# Patient Record
Sex: Male | Born: 1970 | Race: Black or African American | Hispanic: No | Marital: Single | Smoking: Current every day smoker
Health system: Southern US, Community
[De-identification: ages and names within clinical notes are randomized; demographics above are authoritative.]

## PROBLEM LIST (undated history)

## (undated) DIAGNOSIS — R220 Localized swelling, mass and lump, head: Secondary | ICD-10-CM

## (undated) DIAGNOSIS — C801 Malignant (primary) neoplasm, unspecified: Secondary | ICD-10-CM

## (undated) DIAGNOSIS — Z87828 Personal history of other (healed) physical injury and trauma: Secondary | ICD-10-CM

## (undated) DIAGNOSIS — L03116 Cellulitis of left lower limb: Secondary | ICD-10-CM

---

## 2005-11-15 ENCOUNTER — Emergency Department (HOSPITAL_COMMUNITY): Admission: EM | Admit: 2005-11-15 | Discharge: 2005-11-16 | Payer: Self-pay | Admitting: Emergency Medicine

## 2005-12-10 ENCOUNTER — Emergency Department (HOSPITAL_COMMUNITY): Admission: EM | Admit: 2005-12-10 | Discharge: 2005-12-10 | Payer: Self-pay | Admitting: Emergency Medicine

## 2007-03-06 ENCOUNTER — Emergency Department (HOSPITAL_COMMUNITY): Admission: EM | Admit: 2007-03-06 | Discharge: 2007-03-06 | Payer: Self-pay | Admitting: Emergency Medicine

## 2008-09-13 ENCOUNTER — Emergency Department (HOSPITAL_COMMUNITY): Admission: EM | Admit: 2008-09-13 | Discharge: 2008-09-13 | Payer: Self-pay | Admitting: Emergency Medicine

## 2008-09-13 ENCOUNTER — Encounter (INDEPENDENT_AMBULATORY_CARE_PROVIDER_SITE_OTHER): Payer: Self-pay | Admitting: Emergency Medicine

## 2008-09-14 ENCOUNTER — Ambulatory Visit: Payer: Self-pay | Admitting: Vascular Surgery

## 2008-09-16 ENCOUNTER — Emergency Department (HOSPITAL_COMMUNITY): Admission: EM | Admit: 2008-09-16 | Discharge: 2008-09-16 | Payer: Self-pay | Admitting: Emergency Medicine

## 2008-10-29 ENCOUNTER — Emergency Department (HOSPITAL_COMMUNITY): Admission: EM | Admit: 2008-10-29 | Discharge: 2008-10-29 | Payer: Self-pay | Admitting: Emergency Medicine

## 2009-02-23 ENCOUNTER — Emergency Department (HOSPITAL_COMMUNITY): Admission: EM | Admit: 2009-02-23 | Discharge: 2009-02-23 | Payer: Self-pay | Admitting: Emergency Medicine

## 2010-05-01 LAB — BASIC METABOLIC PANEL
BUN: 6 mg/dL (ref 6–23)
GFR calc non Af Amer: >60 mL/min (ref >60)
Potassium: 4.2 mEq/L (ref 3.5–5.1)
Sodium: 139 mEq/L (ref 135–145)

## 2010-05-01 LAB — DIFFERENTIAL
Eosinophils Relative: 1 % (ref 0–5)
Lymphocytes Relative: 31 % (ref 12–46)
Lymphs Abs: 2.3 10*3/uL (ref 0.7–4.0)

## 2010-05-01 LAB — CBC
HCT: 44.8 % (ref 39.0–52.0)
Platelets: 199 10*3/uL (ref 150–400)
WBC: 7.5 10*3/uL (ref 4.0–10.5)

## 2010-05-01 LAB — GLUCOSE, CAPILLARY

## 2010-06-11 NOTE — Consult Note (Signed)
NAME:  Johnny Mooney, Johnny Mooney NO.:  [id number]   MEDICAL RECORD NO.:  [id number]          PATIENT TYPE:  EMS   LOCATION:  ED                           FACILITY:  [facility]   PHYSICIAN:  Bertram Millard. Dahlstedt, M.D.DATE OF BIRTH:  December 15, 1970   DATE OF CONSULTATION:  12/10/2005  DATE OF DISCHARGE:  12/10/2005                                 CONSULTATION   REASON FOR CONSULTATION:  Penile swelling.   BRIEF HISTORY:  A 40 year old male without prior history of STD who  presents to the [facility] Emergency Room today with a one and a half  day history of a swollen area on the right penis.  Over the past three  weeks or so, he has had swollen area on the right posterior scalp.  It  is only in the past day and a half that he has noted this penile area.  It has not drained.  He does not note any lesions in his girlfriend.  He  has not had any dysuria, fever, chills.  He was seen by the ER doctor  and ER consultation was requested by the urology service.   PAST MEDICAL HISTORY:  Noncontributory.   He has a steady partner x10 years.  He has a child.  He is unemployed.  He has recently moved to [place] from [place].   He is not on any medications.  No known drug allergies.   EXAM:  Pleasant adult male.  He had a draining abscess from his  posterior scalp on the right.  Phallus was circumcised.  Glans and  meatus were normal without discharge.  No meatal stenosis.  Penile skin  was edematous on the right.  Approximately 2 cm proximal to the right  coronal ridge, he had a 2 cm semifluctuant area with ichthyotic skin  overlying it.  It was quite tender.  Some surrounding induration.  There  was no broken skin.  He had bilateral groin adenopathy just over a  centimeter nodes that were minimally tender.   IMPRESSION:  Penile lesion, small abscess versus sexually transmitted  disease in a patient without similar history.   PLAN:  1. We gave him Rocephin IM today.  2. Sent  home with doxycycline 100 mg b.i.d. for a week.  3. I gave him a card and I will see him back in the office within the      next two or three days.  4. Refrain from intercourse until this clears up.      Bertram Millard. Dahlstedt, M.D.  Electronically Signed     SMD/MEDQ  D:  12/10/2005  T:  12/10/2005  Job:  [id number]

## 2012-02-19 ENCOUNTER — Inpatient Hospital Stay (HOSPITAL_BASED_OUTPATIENT_CLINIC_OR_DEPARTMENT_OTHER)
Admission: EM | Admit: 2012-02-19 | Discharge: 2012-02-22 | DRG: 603 | Disposition: A | Discharge status: Home / Self Care | Payer: MEDICAID | Attending: Internal Medicine | Admitting: Internal Medicine

## 2012-02-19 ENCOUNTER — Encounter (HOSPITAL_BASED_OUTPATIENT_CLINIC_OR_DEPARTMENT_OTHER): Payer: Self-pay

## 2012-02-19 DIAGNOSIS — L0390 Cellulitis, unspecified: Secondary | ICD-10-CM

## 2012-02-19 DIAGNOSIS — Z720 Tobacco use: Secondary | ICD-10-CM | POA: Diagnosis present

## 2012-02-19 DIAGNOSIS — F17200 Nicotine dependence, unspecified, uncomplicated: Secondary | ICD-10-CM | POA: Diagnosis present

## 2012-02-19 DIAGNOSIS — D72829 Elevated white blood cell count, unspecified: Secondary | ICD-10-CM | POA: Diagnosis present

## 2012-02-19 DIAGNOSIS — L02419 Cutaneous abscess of limb, unspecified: Principal | ICD-10-CM | POA: Diagnosis present

## 2012-02-19 LAB — CBC WITH DIFFERENTIAL/PLATELET
Eosinophils Relative: 1 % (ref 0–5)
HCT: 42.4 % (ref 39.0–52.0)
Hemoglobin: 14.7 g/dL (ref 13.0–17.0)
Lymphocytes Relative: 21 % (ref 12–46)
Lymphs Abs: 2.4 10*3/uL (ref 0.7–4.0)
MCV: 82.8 fL (ref 78.0–100.0)
Monocytes Absolute: 1.4 10*3/uL — ABNORMAL HIGH (ref 0.1–1.0)
Monocytes Relative: 12 % (ref 3–12)
RBC: 5.12 MIL/uL (ref 4.22–5.81)
RDW: 14.9 % (ref 11.5–15.5)
WBC: 11.3 10*3/uL — ABNORMAL HIGH (ref 4.0–10.5)

## 2012-02-19 LAB — BASIC METABOLIC PANEL
BUN: 9 mg/dL (ref 6–23)
CO2: 26 mEq/L (ref 19–32)
Calcium: 9.6 mg/dL (ref 8.4–10.5)
Creatinine, Ser: 0.9 mg/dL (ref 0.50–1.35)
Glucose, Bld: 97 mg/dL (ref 70–99)

## 2012-02-19 MED ORDER — CLINDAMYCIN PHOSPHATE 600 MG/50ML IV SOLN
600.0000 mg | Freq: Once | INTRAVENOUS | Status: AC
  Administered 2012-02-19: 600 mg via INTRAVENOUS
  Filled 2012-02-19: qty 50

## 2012-02-19 NOTE — ED Notes (Signed)
Attempted to call report to unit, RN stated that she would call back.

## 2012-02-19 NOTE — ED Provider Notes (Signed)
Medical screening examination/treatment/procedure(s) were performed by non-physician practitioner and as supervising physician I was immediately available for consultation/collaboration.   Rolan Bucco, MD 02/19/12 2008

## 2012-02-19 NOTE — ED Notes (Signed)
Patients left lower leg red, swollen, painful, warm to touch. Redness streaks up the inside of his leg up into the groin area. States that symptoms started on Thursday, but today the pain is much worse.

## 2012-02-19 NOTE — ED Provider Notes (Signed)
History     CSN: [id number]  Arrival date & time 02/19/12  1443   First MD Initiated Contact with Patient 02/19/12 1553      Chief Complaint  Patient presents with  . Cellulitis    (Consider location/radiation/quality/duration/timing/severity/associated sxs/prior treatment) HPI Comments: Johnny Mooney is a 42 y.o. male w no sig PMHx presents to the ER c/o right lower extremity cellulitis. Onset of symptoms began on Friday morning and were described as warmth, erythema and selling located just above his ankle medially. Since the onset symptoms have been gradually worsening with infection spreading up to his inner thigh. When pt awoke this morning he the pain had worsened to the point that he could not ambulate. Pt does not want pain medication. Pt denies being a Diabetic, recent steroid use, CA hx, HIV or any other reason to be immunocompromised. No known alleviating factors. Pt denies associated s/s including fevers, night sweats, chills, N/V/D, CP , or SOB.   The history is provided by the patient.    History reviewed. No pertinent past medical history.  History reviewed. No pertinent past surgical history.  History reviewed. No pertinent family history.  History  Substance Use Topics  . Smoking status: Current Every Day Smoker -- 2.0 packs/day    Types: Cigars  . Smokeless tobacco: Never Used  . Alcohol Use: 1.0 oz/week    2 drink(s) per week      Review of Systems  Constitutional: Negative for fever, diaphoresis and activity change.  HENT: Negative for congestion and neck pain.   Respiratory: Negative for cough.   Genitourinary: Negative for dysuria.  Musculoskeletal: Negative for myalgias.  Skin: Positive for color change and rash. Negative for wound.  Neurological: Negative for headaches.  All other systems reviewed and are negative.    Allergies  Review of patient's allergies indicates no known allergies.  Home Medications   Current Outpatient Rx    Name  Route  Sig  Dispense  Refill  . IBUPROFEN 200 MG PO TABS   Oral   Take 400 mg by mouth every 8 (eight) hours as needed.           BP 126/82  Pulse 80  Temp 100.5 F (38.1 C) (Oral)  Resp 20  Ht 5\' 10"  (1.778 m)  Wt 205 lb (92.987 kg)  BMI 29.41 kg/m2  SpO2 100%  Physical Exam  Nursing note and vitals reviewed. Constitutional: He is oriented to person, place, and time. He appears well-developed and well-nourished. No distress.  HENT:  Head: Normocephalic and atraumatic.  Eyes: Conjunctivae normal and EOM are normal.  Neck: Normal range of motion.  Cardiovascular:       RRR, intact distal pulses. Cap refill < 3 seconds.   Pulmonary/Chest: Effort normal.  Musculoskeletal: Normal range of motion.       Normal flexion and extension of extremity   Neurological: He is alert and oriented to person, place, and time.  Skin: Skin is warm and dry. No rash noted. He is not diaphoretic. There is erythema.       Erythema of RLE starting at medial malleolus and extending up yo inner thigh. Skin is warm, tender and swollen.   Psychiatric: He has a normal mood and affect. His behavior is normal.    ED Course  Procedures (including critical care time)  Labs Reviewed  CBC WITH DIFFERENTIAL - Abnormal; Notable for the following:    WBC 11.3 (*)     Monocytes Absolute 1.4 (*)  All other components within normal limits  BASIC METABOLIC PANEL  URINALYSIS, ROUTINE W REFLEX MICROSCOPIC   No results found.   No diagnosis found.    MDM  Cellulitis  Patient was past medical history presents emergency department with right lower leg cellulitis.  IV clindamycin given one emergency department.  Patient is to be transferred to [facility] for observation and continued IV antibiotics.  Patient states that he did not have a primary care physician and denies any immunocompromise.The patient appears reasonably stabilized for admission considering the current resources, flow, and  capabilities available in the ED at this time, and I doubt any other [facility] requiring further screening and/or treatment in the ED prior to admission.         Jaci Carrel, [place] 02/19/12 1919

## 2012-02-19 NOTE — ED Notes (Signed)
The patient know that we need urine, he has stated that he is unable to urinate at this time.

## 2012-02-19 NOTE — ED Notes (Signed)
Pt states that he has tenderness, redness, inflammation, and swelling to the R leg.  Pt states that onset was Friday evening and today pain is more severe he is unable to tolerate it.  Pt states ambulation is very painful if not nearly impossible due to pain.

## 2012-02-20 ENCOUNTER — Encounter (HOSPITAL_COMMUNITY): Payer: Self-pay | Admitting: Internal Medicine

## 2012-02-20 DIAGNOSIS — F17200 Nicotine dependence, unspecified, uncomplicated: Secondary | ICD-10-CM

## 2012-02-20 DIAGNOSIS — L0291 Cutaneous abscess, unspecified: Secondary | ICD-10-CM

## 2012-02-20 DIAGNOSIS — L0390 Cellulitis, unspecified: Secondary | ICD-10-CM

## 2012-02-20 DIAGNOSIS — Z720 Tobacco use: Secondary | ICD-10-CM | POA: Diagnosis present

## 2012-02-20 LAB — CBC
HCT: 39.5 % (ref 39.0–52.0)
Hemoglobin: 13.4 g/dL (ref 13.0–17.0)
MCH: 28.4 pg (ref 26.0–34.0)
MCHC: 33.9 g/dL (ref 30.0–36.0)
MCV: 83.7 fL (ref 78.0–100.0)
RDW: 14.9 % (ref 11.5–15.5)

## 2012-02-20 LAB — BASIC METABOLIC PANEL
BUN: 7 mg/dL (ref 6–23)
CO2: 28 mEq/L (ref 19–32)
Chloride: 101 mEq/L (ref 96–112)
Creatinine, Ser: 0.84 mg/dL (ref 0.50–1.35)
Glucose, Bld: 97 mg/dL (ref 70–99)
Potassium: 4.1 mEq/L (ref 3.5–5.1)

## 2012-02-20 LAB — URINALYSIS, ROUTINE W REFLEX MICROSCOPIC
Glucose, UA: NEGATIVE mg/dL
Hgb urine dipstick: NEGATIVE
Ketones, ur: NEGATIVE mg/dL
Protein, ur: NEGATIVE mg/dL
pH: 6.5 (ref 5.0–8.0)

## 2012-02-20 MED ORDER — TRAMADOL HCL 50 MG PO TABS
50.0000 mg | ORAL_TABLET | Freq: Four times a day (QID) | ORAL | Status: DC | PRN
  Administered 2012-02-20 – 2012-02-22 (×3): 50 mg via ORAL
  Filled 2012-02-20 (×3): qty 1

## 2012-02-20 MED ORDER — SODIUM CHLORIDE 0.9 % IV SOLN: INTRAVENOUS | Status: DC

## 2012-02-20 MED ORDER — HYDROMORPHONE HCL PF 1 MG/ML IJ SOLN
1.0000 mg | INTRAMUSCULAR | Status: DC | PRN
  Administered 2012-02-20 – 2012-02-21 (×5): 1 mg via INTRAVENOUS
  Filled 2012-02-20 (×5): qty 1

## 2012-02-20 MED ORDER — ONDANSETRON HCL 4 MG/2ML IJ SOLN: 4.0000 mg | Freq: Three times a day (TID) | INTRAMUSCULAR | Status: DC | PRN

## 2012-02-20 MED ORDER — ACETAMINOPHEN 325 MG PO TABS
650.0000 mg | ORAL_TABLET | Freq: Four times a day (QID) | ORAL | Status: DC | PRN
  Administered 2012-02-20 – 2012-02-21 (×3): 650 mg via ORAL
  Filled 2012-02-20 (×3): qty 2

## 2012-02-20 MED ORDER — VANCOMYCIN HCL 10 G IV SOLR
1750.0000 mg | Freq: Once | INTRAVENOUS | Status: AC
  Administered 2012-02-20: 1750 mg via INTRAVENOUS
  Filled 2012-02-20: qty 1750

## 2012-02-20 MED ORDER — TETANUS-DIPHTH-ACELL PERTUSSIS 5-2.5-18.5 LF-MCG/0.5 IM SUSP
0.5000 mL | Freq: Once | INTRAMUSCULAR | Status: AC
  Administered 2012-02-20: 0.5 mL via INTRAMUSCULAR
  Filled 2012-02-20: qty 0.5

## 2012-02-20 MED ORDER — CIPROFLOXACIN IN D5W 400 MG/200ML IV SOLN
400.0000 mg | Freq: Two times a day (BID) | INTRAVENOUS | Status: DC
  Administered 2012-02-20 – 2012-02-22 (×5): 400 mg via INTRAVENOUS
  Filled 2012-02-20 (×6): qty 200

## 2012-02-20 MED ORDER — HYDROMORPHONE HCL PF 1 MG/ML IJ SOLN
INTRAMUSCULAR | Status: AC
  Filled 2012-02-20: qty 1

## 2012-02-20 MED ORDER — WHITE PETROLATUM GEL
Status: AC
  Administered 2012-02-20: 03:00:00
  Filled 2012-02-20: qty 5

## 2012-02-20 MED ORDER — SODIUM CHLORIDE 0.9 % IV SOLN
INTRAVENOUS | Status: DC
  Administered 2012-02-20: 1000 mL via INTRAVENOUS

## 2012-02-20 MED ORDER — ONDANSETRON HCL 4 MG/2ML IJ SOLN: 4.0000 mg | Freq: Four times a day (QID) | INTRAMUSCULAR | Status: DC | PRN

## 2012-02-20 MED ORDER — ENOXAPARIN SODIUM 40 MG/0.4ML ~~LOC~~ SOLN
40.0000 mg | SUBCUTANEOUS | Status: DC
  Administered 2012-02-20 – 2012-02-22 (×3): 40 mg via SUBCUTANEOUS
  Filled 2012-02-20 (×3): qty 0.4

## 2012-02-20 MED ORDER — ONDANSETRON HCL 4 MG PO TABS
4.0000 mg | ORAL_TABLET | Freq: Four times a day (QID) | ORAL | Status: DC | PRN
  Administered 2012-02-20: 4 mg via ORAL
  Filled 2012-02-20: qty 1

## 2012-02-20 MED ORDER — VANCOMYCIN HCL IN DEXTROSE 1-5 GM/200ML-% IV SOLN
1000.0000 mg | Freq: Three times a day (TID) | INTRAVENOUS | Status: DC
  Administered 2012-02-20 – 2012-02-22 (×6): 1000 mg via INTRAVENOUS
  Filled 2012-02-20 (×8): qty 200

## 2012-02-20 MED ORDER — ACETAMINOPHEN 650 MG RE SUPP: 650.0000 mg | Freq: Four times a day (QID) | RECTAL | Status: DC | PRN

## 2012-02-20 NOTE — Progress Notes (Signed)
Utilization review completed. Shakera Ebrahimi, RN, BSN. 

## 2012-02-20 NOTE — H&P (Signed)
Johnny Mooney is an 42 y.o. male.  Patient was seen and examined on February 20, 2012. PCP - none.  Chief Complaint: Right lower extremity pain.  HPI: 42 year old male with no significant past medical history started experiencing fever and chills on Friday 3 days ago with small skin induration on the distal aspect of the right leg. The swelling of the right leg slowly became worse and and started to progress proximally. At this point patient came to the ER in [facility] [place]. Patient has been started IV clindamycin and has been admitted for further IV antibiotics. Patient does not recall having any insect bites or trauma. Denies having similar episodes previously.  History reviewed. No pertinent past medical history.  Past Surgical History  Procedure Date  . Trauma surgery     Family History  Problem Relation Age of Onset  . Diabetes Mellitus II Mother   . Asthma Father    Social History:  reports that he has been smoking Cigars.  He has never used smokeless tobacco. He reports that he drinks about one ounce of alcohol per week. He reports that he does not use illicit drugs.  Allergies: No Known Allergies  Medications Prior to Admission  Medication Sig Dispense Refill  . ibuprofen (ADVIL,MOTRIN) 200 MG tablet Take 400 mg by mouth every 8 (eight) hours as needed.        Results for orders placed during the hospital encounter of 02/19/12 (from the past 48 hour(s))  CBC WITH DIFFERENTIAL     Status: Abnormal   Collection Time   02/19/12  4:11 PM      Component Value Range Comment   WBC 11.3 (*) 4.0 - 10.5 K/uL    RBC 5.12  4.22 - 5.81 MIL/uL    Hemoglobin 14.7  13.0 - 17.0 g/dL    HCT 16.1  09.6 - 04.5 %    MCV 82.8  78.0 - 100.0 fL    MCH 28.7  26.0 - 34.0 pg    MCHC 34.7  30.0 - 36.0 g/dL    RDW 40.9  81.1 - 91.4 %    Platelets 178  150 - 400 K/uL    Neutrophils Relative 66  43 - 77 %    Neutro Abs 7.4  1.7 - 7.7 K/uL    Lymphocytes Relative 21  12 - 46 %    Lymphs Abs 2.4  0.7 - 4.0 K/uL    Monocytes Relative 12  3 - 12 %    Monocytes Absolute 1.4 (*) 0.1 - 1.0 K/uL    Eosinophils Relative 1  0 - 5 %    Eosinophils Absolute 0.1  0.0 - 0.7 K/uL    Basophils Relative 0  0 - 1 %    Basophils Absolute 0.0  0.0 - 0.1 K/uL   BASIC METABOLIC PANEL     Status: Normal   Collection Time   02/19/12  4:11 PM      Component Value Range Comment   Sodium 140  135 - 145 mEq/L    Potassium 3.9  3.5 - 5.1 mEq/L    Chloride 99  96 - 112 mEq/L    CO2 26  19 - 32 mEq/L    Glucose, Bld 97  70 - 99 mg/dL    BUN 9  6 - 23 mg/dL    Creatinine, Ser 7.82  0.50 - 1.35 mg/dL    Calcium 9.6  8.4 - 95.6 mg/dL    GFR calc non Af  Amer >90  >90 mL/min    GFR calc Af Amer >90  >90 mL/min    No results found.  Review of Systems  Constitutional: Positive for fever and chills.  HENT: Negative.   Eyes: Negative.   Respiratory: Negative.   Cardiovascular: Negative.   Gastrointestinal: Negative.   Genitourinary: Negative.   Musculoskeletal:       Right lower extremity pain with eythema and swelling.  Skin: Negative.   Neurological: Negative.   Psychiatric/Behavioral: Negative.     Blood pressure 117/67, pulse 82, temperature 99.3 F (37.4 C), temperature source Oral, resp. rate 18, height 5\' 10"  (1.778 m), weight 87.272 kg (192 lb 6.4 oz), SpO2 98.00%. Physical Exam  Constitutional: He is oriented to person, place, and time. He appears well-developed and well-nourished. No distress.  HENT:  Head: Normocephalic and atraumatic.  Right Ear: External ear normal.  Left Ear: External ear normal.  Nose: Nose normal.  Mouth/Throat: Oropharynx is clear and moist. No oropharyngeal exudate.  Eyes: Conjunctivae normal are normal. Pupils are equal, round, and reactive to light. Right eye exhibits no discharge. Left eye exhibits no discharge. No scleral icterus.  Neck: Normal range of motion. Neck supple.  Cardiovascular: Normal rate and regular rhythm.   Respiratory:  Effort normal and breath sounds normal. No respiratory distress. He has no wheezes. He has no rales.  GI: Soft. Bowel sounds are normal. He exhibits no distension. There is no tenderness. There is no rebound.  Musculoskeletal:       Pain and swelling of the right lower extremity extending from right ankle ti distal thigh. Able to move foot without difficulty. Good sensation and pulses.  Neurological: He is alert and oriented to person, place, and time.  Skin: He is not diaphoretic.  Psychiatric: His behavior is normal.     Assessment/Plan #1. Cellulitis of the right lower extremity - patient has been placed on vancomycin and Cipro IV. At this moment patient is able to move his right extremity without difficulty and does not have any features of compartment syndrome. Closely observe clinically for any worsening. #2. Tobacco abuse - strongly advised to quit smoking.  CODE STATUS - full code.  Jlee Harkless N. 02/20/2012, 1:00 AM

## 2012-02-20 NOTE — Progress Notes (Signed)
ANTIBIOTIC CONSULT NOTE - INITIAL  Pharmacy Consult for Vancomycin Indication: cellulitis  No Known Allergies  Patient Measurements: Height: 5\' 10"  (177.8 cm) Weight: 192 lb 6.4 oz (87.272 kg) IBW/kg (Calculated) : 73   Vital Signs: Temp: 99.3 F (37.4 C) (01/26 2349) Temp src: Oral (01/26 2138) BP: 117/67 mmHg (01/26 2349) Pulse Rate: 82  (01/26 2349)  Labs:  Basename 02/19/12 1611  WBC 11.3*  HGB 14.7  PLT 178  LABCREA --  CREATININE 0.90   Estimated Creatinine Clearance: 113.8 ml/min (by C-G formula based on Cr of 0.9). No results found for this basename: VANCOTROUGH:2,VANCOPEAK:2,VANCORANDOM:2,GENTTROUGH:2,GENTPEAK:2,GENTRANDOM:2,TOBRATROUGH:2,TOBRAPEAK:2,TOBRARND:2,AMIKACINPEAK:2,AMIKACINTROU:2,AMIKACIN:2, in the last 72 hours   Microbiology: No results found for this or any previous visit (from the past 720 hour(s)).  Medical History: History reviewed. No pertinent past medical history.  Medications:  Prescriptions prior to admission  Medication Sig Dispense Refill  . ibuprofen (ADVIL,MOTRIN) 200 MG tablet Take 400 mg by mouth every 8 (eight) hours as needed.       Assessment: 42 yo male with cellulitis for empiric antibiotics  Goal of Therapy:  Vancomycin trough level 10-15 mcg/ml  Plan:  Vancomycin 1750 mg IV now, then 1 g IV q8h  Eddie Candle 02/20/2012,1:02 AM

## 2012-02-20 NOTE — Progress Notes (Signed)
[facility] PROGRESS NOTE  Johnny Mooney SAY:[id number] DOB: 11/09/1970 DOA: 02/19/2012 PCP: No primary provider on file.  Brief narrative 42 year old male with no significant past medical history started experiencing fever and chills on Friday 3 days ago with small skin induration on the distal aspect of the right leg. The swelling of the right leg slowly became worse and and started to progress proximally. At this point patient came to the ER in [facility] [place]. Patient has been started IV clindamycin and has been admitted for further IV antibiotics. Patient does not recall having any insect bites or trauma. Denies having similar episodes previously.  Assessment/Plan: 1. Right lower extremity cellulitis: Unclear precipitant. No obvious open wounds on the leg. Improving. Continue IV antibiotics for additional 24 hours and then consider discharge home on oral antibiotics. 2. Tobacco abuse: Cessation counseled.  Code Status: Full Family Communication: Discussed with patient Disposition Plan: Home when medically stable. Possibly 1/28   Consultants:  None  Procedures:  None  Antibiotics:  IV vancomycin 1/27 >  IV Cipro 1/27 >  HPI/Subjective: Indicates that right leg is approximately 70% better-decreasing redness, swelling and pain.  Objective: Filed Vitals:   02/19/12 1809 02/19/12 2138 02/19/12 2349 02/20/12 0614  BP: 126/82 124/74 117/67 112/59  Pulse: 80 81 82 61  Temp: 100.5 F (38.1 C) 99 F (37.2 C) 99.3 F (37.4 C) 97.9 F (36.6 C)  TempSrc: Oral Oral    Resp: 20 18 18 18   Height:   5\' 10"  (1.778 m)   Weight:   87.272 kg (192 lb 6.4 oz)   SpO2: 100% 98% 98% 99%    Intake/Output Summary (Last 24 hours) at 02/20/12 0859 Last data filed at 02/20/12 0500  Gross per 24 hour  Intake    120 ml  Output      0 ml  Net    120 ml   Filed Weights   02/19/12 1500 02/19/12 2349  Weight: 92.987 kg (205 lb) 87.272 kg (192 lb 6.4 oz)     Exam:   General exam: Comfortable.  Respiratory system: Clear. No increased work of breathing.  Cardiovascular: S1 and S2 heard, or cough. No JVD, murmurs or pedal edema.  Gastrointestinal system: Abdomen is nondistended, soft and nontender. Normal bowel sounds heard.  Central nervous system: Alert and oriented. No focal neurological deficits.  Extremities: Decreasing erythema, swelling and warmth (anterior medial right leg and inferior medial thigh)-receding from pen mark drawn on admission. Peripheral pulses symmetrically felt.  Data Reviewed: Basic Metabolic Panel:  Lab 02/20/12 2355 02/19/12 1611  NA 138 140  K 4.1 3.9  CL 101 99  CO2 28 26  GLUCOSE 97 97  BUN 7 9  CREATININE 0.84 0.90  CALCIUM 8.9 9.6  MG -- --  PHOS -- --   Liver Function Tests: No results found for this basename: AST:5,ALT:5,ALKPHOS:5,BILITOT:5,PROT:5,ALBUMIN:5 in the last 168 hours No results found for this basename: LIPASE:5,AMYLASE:5 in the last 168 hours No results found for this basename: AMMONIA:5 in the last 168 hours CBC:  Lab 02/20/12 0550 02/19/12 1611  WBC 11.4* 11.3*  NEUTROABS -- 7.4  HGB 13.4 14.7  HCT 39.5 42.4  MCV 83.7 82.8  PLT 187 178   Cardiac Enzymes: No results found for this basename: CKTOTAL:5,CKMB:5,CKMBINDEX:5,TROPONINI:5 in the last 168 hours BNP (last 3 results) No results found for this basename: PROBNP:3 in the last 8760 hours CBG: No results found for this basename: GLUCAP:5 in the last 168 hours  No results  found for this or any previous visit (from the past 240 hour(s)).   Studies: No results found.  Scheduled Meds:   . ciprofloxacin  400 mg Intravenous Q12H  . enoxaparin (LOVENOX) injection  40 mg Subcutaneous Q24H  . HYDROmorphone      . TDaP  0.5 mL Intramuscular Once  . vancomycin  1,000 mg Intravenous Q8H   Continuous Infusions:   . sodium chloride 1,000 mL (02/20/12 0150)    Principal Problem:  *Cellulitis Active Problems:   Tobacco abuse    Time spent: 25 minutes    [facility]  [facility] Pager [phone]. If 8PM-8AM, please contact night-coverage at [email or web address], password [facility] 02/20/2012, 8:59 AM  LOS: 1 day

## 2012-02-21 MED ORDER — IBUPROFEN 600 MG PO TABS
600.0000 mg | ORAL_TABLET | Freq: Three times a day (TID) | ORAL | Status: DC
  Administered 2012-02-21 – 2012-02-22 (×3): 600 mg via ORAL
  Filled 2012-02-21 (×5): qty 1

## 2012-02-21 NOTE — Progress Notes (Signed)
[facility] PROGRESS NOTE  BERT PTACEK EAV:[id number] DOB: [birth year] DOA: 02/19/2012 PCP: No primary provider on file.  Brief narrative 42 year old male with no significant past medical history started experiencing fever and chills on Friday 3 days ago with small skin induration on the distal aspect of the right leg. The swelling of the right leg slowly became worse and and started to progress proximally. At this point patient came to the ER in [facility] [place]. Patient has been started IV clindamycin and has been admitted for further IV antibiotics. Patient does not recall having any insect bites or trauma. Denies having similar episodes previously.  Assessment/Plan: 1. Right lower extremity cellulitis: Unclear precipitant. No obvious open wounds on the leg. Improving but still with fair amount of redness, warmth and mild tenderness in right anteriomedial leg. Continue IV antibiotics for additional 24 hours and then consider discharge home on oral antibiotics. 2. Tobacco abuse: Cessation counseled.  Code Status: Full Family Communication: Discussed with patient Disposition Plan: Home when medically stable. Possibly 1/29   Consultants:  None  Procedures:  None  Antibiotics:  IV vancomycin 1/27 >  IV Cipro 1/27 >  HPI/Subjective: Patient indicates that right leg is slowly improving. Mild pain. Able to ambulate. Redness not much better since yesterday.  Objective: Filed Vitals:   02/19/12 2349 02/20/12 0614 02/20/12 1337 02/20/12 2100  BP: 117/67 112/59 117/72 106/74  Pulse: 82 61 78 71  Temp: 99.3 F (37.4 C) 97.9 F (36.6 C) 98.9 F (37.2 C) 98.8 F (37.1 C)  TempSrc:      Resp: 18 18 20 20   Height: 5\' 10"  (1.778 m)     Weight: 87.272 kg (192 lb 6.4 oz)     SpO2: 98% 99% 99% 95%   No intake or output data in the 24 hours ending 02/21/12 1136 Filed Weights   02/19/12 1500 02/19/12 2349  Weight: 92.987 kg (205 lb) 87.272 kg (192 lb 6.4 oz)     Exam:   General exam: Comfortable.  Respiratory system: Clear. No increased work of breathing.  Cardiovascular: S1 and S2 heard, or cough. No JVD, murmurs or pedal edema.  Gastrointestinal system: Abdomen is nondistended, soft and nontender. Normal bowel sounds heard.  Central nervous system: Alert and oriented. No focal neurological deficits.  Extremities: Decreasing erythema, swelling and warmth (anterior medial right leg) but not significantly changed since yesterday. Thigh findings have almost resolved. Peripheral pulses symmetrically felt.  Data Reviewed: Basic Metabolic Panel:  Lab 02/20/12 7829 02/19/12 1611  NA 138 140  K 4.1 3.9  CL 101 99  CO2 28 26  GLUCOSE 97 97  BUN 7 9  CREATININE 0.84 0.90  CALCIUM 8.9 9.6  MG -- --  PHOS -- --   Liver Function Tests: No results found for this basename: AST:5,ALT:5,ALKPHOS:5,BILITOT:5,PROT:5,ALBUMIN:5 in the last 168 hours No results found for this basename: LIPASE:5,AMYLASE:5 in the last 168 hours No results found for this basename: AMMONIA:5 in the last 168 hours CBC:  Lab 02/20/12 0550 02/19/12 1611  WBC 11.4* 11.3*  NEUTROABS -- 7.4  HGB 13.4 14.7  HCT 39.5 42.4  MCV 83.7 82.8  PLT 187 178   Cardiac Enzymes: No results found for this basename: CKTOTAL:5,CKMB:5,CKMBINDEX:5,TROPONINI:5 in the last 168 hours BNP (last 3 results) No results found for this basename: PROBNP:3 in the last 8760 hours CBG: No results found for this basename: GLUCAP:5 in the last 168 hours  No results found for this or any previous visit (from the past  240 hour(s)).   Studies: No results found.  Scheduled Meds:    . ciprofloxacin  400 mg Intravenous Q12H  . enoxaparin (LOVENOX) injection  40 mg Subcutaneous Q24H  . vancomycin  1,000 mg Intravenous Q8H   Continuous Infusions:   Principal Problem:  *Cellulitis Active Problems:  Tobacco abuse    Time spent: 25 minutes    [facility]  [facility] Pager  [phone]. If 8PM-8AM, please contact night-coverage at [email or web address], password [facility] 02/21/2012, 11:36 AM  LOS: 2 days

## 2012-02-22 LAB — CBC
MCH: 28 pg (ref 26.0–34.0)
MCHC: 32.9 g/dL (ref 30.0–36.0)
MCV: 85.1 fL (ref 78.0–100.0)
Platelets: 218 10*3/uL (ref 150–400)
RBC: 4.71 MIL/uL (ref 4.22–5.81)
RDW: 14.9 % (ref 11.5–15.5)

## 2012-02-22 MED ORDER — DOXYCYCLINE HYCLATE 100 MG PO TABS: 100.0000 mg | ORAL_TABLET | Freq: Two times a day (BID) | ORAL | Status: DC

## 2012-02-22 MED ORDER — HYDROCODONE-ACETAMINOPHEN 5-500 MG PO TABS: 1.0000 | ORAL_TABLET | Freq: Four times a day (QID) | ORAL | Status: DC | PRN

## 2012-02-22 NOTE — Progress Notes (Signed)
Patient provided with discharge instructions and follow up appointment information. He is aware of signs/symptoms to look for of worsening cellulitis. He is in stable condition with no complaints at this time.

## 2012-02-22 NOTE — Discharge Summary (Signed)
Physician Discharge Summary  Patient ID: Johnny Mooney MRN: [id number] DOB/AGE: 09-26-1970 42 y.o.  Admit date: 02/19/2012 Discharge date: 02/22/2012  Primary Care Physician:  No primary provider on file.   Discharge Diagnoses:    Principal Problem:  *Cellulitis Active Problems:  Tobacco abuse      Medication List     As of 02/22/2012 11:56 AM    STOP taking these medications         ALEVE PO      DAYQUIL PO      TAKE these medications         doxycycline 100 MG tablet   Commonly known as: VIBRA-TABS   Take 1 tablet (100 mg total) by mouth 2 (two) times daily.      HYDROcodone-acetaminophen 5-500 MG per tablet   Commonly known as: VICODIN   Take 1 tablet by mouth every 6 (six) hours as needed for pain.      ibuprofen 200 MG tablet   Commonly known as: ADVIL,MOTRIN   Take 400 mg by mouth every 6 (six) hours as needed. For pain.         Disposition and Follow-up:  Will be discharged home today in stable and improved condition. Has been asked to follow up with an MD in about 2 weeks to evaluate for resolution of his cellulitis.  Consults:  None   Significant Diagnostic Studies:  No results found.  Brief H and P: For complete details please refer to admission H and P, but in brief patient is a 42 year old male with no significant past medical history started experiencing fever and chills on Friday 3 days ago with small skin induration on the distal aspect of the right leg. The swelling of the right leg slowly became worse and and started to progress proximally. At this point patient came to the ER in [facility]. Patient has been started IV clindamycin and has been admitted for further IV antibiotics. Patient does not recall having any insect bites or trauma. Denies having similar episodes previously. We were asked to admit him for further evaluation and management.     Hospital Course:  Principal Problem:  *Cellulitis Active Problems:  Tobacco abuse   RLE Cellulitis -Much improved on IV antibiotics. -Afebrile. -Had a leukocytosis of 11.4 on admission that has resolved. -Will DC home today on 14 days of PO doxy.   Time spent on Discharge: Greater than 30 minutes.  SignedChaya Jan [facility] Pager: [phone] 02/22/2012, 11:56 AM

## 2012-06-21 ENCOUNTER — Emergency Department (HOSPITAL_COMMUNITY)
Admission: EM | Admit: 2012-06-21 | Discharge: 2012-06-21 | Disposition: A | Discharge status: Home / Self Care | Payer: Self-pay | Attending: Emergency Medicine | Admitting: Emergency Medicine

## 2012-06-21 ENCOUNTER — Encounter (HOSPITAL_COMMUNITY): Payer: Self-pay | Admitting: Emergency Medicine

## 2012-06-21 DIAGNOSIS — L539 Erythematous condition, unspecified: Secondary | ICD-10-CM | POA: Insufficient documentation

## 2012-06-21 DIAGNOSIS — L02419 Cutaneous abscess of limb, unspecified: Secondary | ICD-10-CM | POA: Insufficient documentation

## 2012-06-21 DIAGNOSIS — L03115 Cellulitis of right lower limb: Secondary | ICD-10-CM

## 2012-06-21 DIAGNOSIS — F17200 Nicotine dependence, unspecified, uncomplicated: Secondary | ICD-10-CM | POA: Insufficient documentation

## 2012-06-21 DIAGNOSIS — L03119 Cellulitis of unspecified part of limb: Secondary | ICD-10-CM | POA: Insufficient documentation

## 2012-06-21 DIAGNOSIS — M79609 Pain in unspecified limb: Secondary | ICD-10-CM | POA: Insufficient documentation

## 2012-06-21 LAB — CBC WITH DIFFERENTIAL/PLATELET
Basophils Absolute: 0 K/uL (ref 0.0–0.1)
Basophils Relative: 0 % (ref 0–1)
Eosinophils Absolute: 0.3 K/uL (ref 0.0–0.7)
Eosinophils Relative: 4 % (ref 0–5)
HCT: 40.2 % (ref 39.0–52.0)
Hemoglobin: 13.3 g/dL (ref 13.0–17.0)
Lymphocytes Relative: 36 % (ref 12–46)
Lymphs Abs: 2.8 K/uL (ref 0.7–4.0)
MCH: 27.8 pg (ref 26.0–34.0)
MCHC: 33.1 g/dL (ref 30.0–36.0)
MCV: 84.1 fL (ref 78.0–100.0)
Monocytes Absolute: 0.8 K/uL (ref 0.1–1.0)
Monocytes Relative: 10 % (ref 3–12)
Neutro Abs: 3.8 K/uL (ref 1.7–7.7)
Neutrophils Relative %: 50 % (ref 43–77)
Platelets: 175 K/uL (ref 150–400)
RBC: 4.78 MIL/uL (ref 4.22–5.81)
RDW: 15.3 % (ref 11.5–15.5)
WBC: 7.7 K/uL (ref 4.0–10.5)

## 2012-06-21 LAB — BASIC METABOLIC PANEL
Calcium: 9.1 mg/dL (ref 8.4–10.5)
Chloride: 102 mEq/L (ref 96–112)
Creatinine, Ser: 0.74 mg/dL (ref 0.50–1.35)
GFR calc Af Amer: >90 mL/min (ref >90)
Sodium: 138 mEq/L (ref 135–145)

## 2012-06-21 MED ORDER — CLINDAMYCIN PHOSPHATE 600 MG/50ML IV SOLN
600.0000 mg | Freq: Once | INTRAVENOUS | Status: AC
  Administered 2012-06-21: 600 mg via INTRAVENOUS
  Filled 2012-06-21: qty 50

## 2012-06-21 MED ORDER — OXYCODONE-ACETAMINOPHEN 5-325 MG PO TABS
2.0000 | ORAL_TABLET | Freq: Once | ORAL | Status: AC
  Administered 2012-06-21: 2 via ORAL
  Filled 2012-06-21: qty 2

## 2012-06-21 MED ORDER — OXYCODONE-ACETAMINOPHEN 5-325 MG PO TABS: 2.0000 | ORAL_TABLET | ORAL | Status: DC | PRN

## 2012-06-21 MED ORDER — CLINDAMYCIN HCL 150 MG PO CAPS
300.0000 mg | ORAL_CAPSULE | Freq: Four times a day (QID) | ORAL | Status: DC
Start: 2012-06-21 — End: 2012-11-18

## 2012-06-21 NOTE — ED Notes (Signed)
Pt ambulating independently w/ steady gait on d/c in no acute distress, A&Ox4. D/c instructions reviewed w/ pt, pt denies any further questions or concerns at present. Rx given x2. Pt instructed to not use alcohol, drive, or operate heavy machinery while take the prescription pain medications as they could make him drowsy - pt verbalized understanding.

## 2012-06-21 NOTE — ED Provider Notes (Signed)
History     CSN: [id number]  Arrival date & time 06/21/12  4098   First MD Initiated Contact with Patient 06/21/12 [phone]      Chief Complaint  Patient presents with  . Leg Swelling    (Consider location/radiation/quality/duration/timing/severity/associated sxs/prior treatment) HPI 42 year old male presents to emergency room with complaint of right lower leg cellulitis.  He reports symptoms started about midnight.  Patient reports pain, redness, and warmth to the area.  Patient has had cellulitis in the past, reports the same area and same onset.  No fevers no chills.  No injury.  No posterior calf pain.  No DVT risk factors.  History reviewed. No pertinent past medical history.  Past Surgical History  Procedure Laterality Date  . Trauma surgery      Family History  Problem Relation Age of Onset  . Diabetes Mellitus II Mother   . Asthma Father     History  Substance Use Topics  . Smoking status: Current Every Day Smoker -- 2.00 packs/day    Types: Cigars  . Smokeless tobacco: Never Used     Comment: quit cigarette smoking 4 yrs ago, currently smokes 2 cigars daily  . Alcohol Use: 33.6 oz/week    56 Shots of liquor per week     Comment: drinks 8 ozs liquor daily, 16 ozs. plus on weekends or when not working, denies withdrawal symptoms-no alcohol consumed for 1 week recently w/o difficulties      Review of Systems  All other systems reviewed and are negative.    Allergies  Review of patient's allergies indicates no known allergies.  Home Medications   Current Outpatient Rx  Name  Route  Sig  Dispense  Refill  . clindamycin (CLEOCIN) 150 MG capsule   Oral   Take 2 capsules (300 mg total) by mouth every 6 (six) hours.   56 capsule   0   . oxyCODONE-acetaminophen (PERCOCET/ROXICET) 5-325 MG per tablet   Oral   Take 2 tablets by mouth every 4 (four) hours as needed for pain.   20 tablet   0     BP 114/74  Pulse 80  Temp(Src) 98.3 F (36.8 C) (Oral)   SpO2 99%  Physical Exam  Nursing note and vitals reviewed. Constitutional: He is oriented to person, place, and time. He appears well-developed and well-nourished.  HENT:  Head: Normocephalic and atraumatic.  Nose: Nose normal.  Mouth/Throat: Oropharynx is clear and moist.  Eyes: Conjunctivae and EOM are normal. Pupils are equal, round, and reactive to light.  Neck: Normal range of motion. Neck supple. No JVD present. No tracheal deviation present. No thyromegaly present.  Cardiovascular: Normal rate, regular rhythm, normal heart sounds and intact distal pulses.  Exam reveals no gallop and no friction rub.   No murmur heard. Pulmonary/Chest: Effort normal and breath sounds normal. No stridor. No respiratory distress. He has no wheezes. He has no rales. He exhibits no tenderness.  Abdominal: Soft. Bowel sounds are normal. He exhibits no distension and no mass. There is no tenderness. There is no rebound and no guarding.  Musculoskeletal: Normal range of motion. He exhibits tenderness (5 cm area of erythema with warmth and edema to right lower lateral calf.  There is no streaking up the leg.  Compartments are soft.  Negative Homans). He exhibits no edema.  Lymphadenopathy:    He has no cervical adenopathy.  Neurological: He is alert and oriented to person, place, and time. He exhibits normal muscle tone. Coordination normal.  Skin: Skin is warm and dry. Rash (patient with rash to feet bilaterally, appears chronic in nature) noted. No erythema. No pallor.  Psychiatric: He has a normal mood and affect. His behavior is normal. Judgment and thought content normal.    ED Course  Procedures (including critical care time)  Labs Reviewed  CBC WITH DIFFERENTIAL  BASIC METABOLIC PANEL   No results found.   1. Cellulitis of right lower leg       MDM  42 year old male with early cellulitis of the right lower leg.  No fevers, no systemic symptoms at this time.  Patient given one dose of IV  clinic, and we'll plan to send home with every 6 hours dosing.  Patient informed to come back to the emergency room for recheck in 2 days.  Also given precautions for earlier return.       Olivia Mackie, MD 06/21/12 [phone]

## 2012-06-21 NOTE — ED Notes (Addendum)
Pt reports RLL swelling and redness over the past few days - area appears edematous and red, skin hot to touch on assessment. Pt denies fever, body aches or chills. Pt admits to hx of cellulitis. Pt is A&Ox4 and in no acute distress.

## 2012-06-21 NOTE — ED Notes (Signed)
PT. REPORTS PROGRESSING RIGHT LOWER LEG SWELLING ( CELLULITIS ) WITH PAIN FOR SEVERAL DAYS , DENIES FEVER OR CHILLS. NO INJURY. AMBULATORY.

## 2012-06-21 NOTE — ED Notes (Signed)
Pt states he feels drowsy at present and unable to drive at this time. Charge notified of pt status.

## 2012-11-18 ENCOUNTER — Encounter (HOSPITAL_COMMUNITY): Payer: Self-pay | Admitting: Emergency Medicine

## 2012-11-18 ENCOUNTER — Emergency Department (HOSPITAL_COMMUNITY)
Admission: EM | Admit: 2012-11-18 | Discharge: 2012-11-19 | Disposition: A | Discharge status: Home / Self Care | Payer: Self-pay | Attending: Emergency Medicine | Admitting: Emergency Medicine

## 2012-11-18 DIAGNOSIS — L259 Unspecified contact dermatitis, unspecified cause: Secondary | ICD-10-CM | POA: Insufficient documentation

## 2012-11-18 DIAGNOSIS — T7840XA Allergy, unspecified, initial encounter: Secondary | ICD-10-CM

## 2012-11-18 DIAGNOSIS — F17200 Nicotine dependence, unspecified, uncomplicated: Secondary | ICD-10-CM | POA: Insufficient documentation

## 2012-11-18 MED ORDER — DIPHENHYDRAMINE HCL 25 MG PO CAPS
25.0000 mg | ORAL_CAPSULE | Freq: Once | ORAL | Status: AC
  Administered 2012-11-19: 25 mg via ORAL
  Filled 2012-11-18 (×2): qty 1

## 2012-11-18 MED ORDER — PREDNISONE 10 MG PO TABS: 20.0000 mg | ORAL_TABLET | Freq: Every day | ORAL | Status: DC

## 2012-11-18 MED ORDER — CEPHALEXIN 500 MG PO CAPS: 500.0000 mg | ORAL_CAPSULE | Freq: Four times a day (QID) | ORAL | Status: DC

## 2012-11-18 MED ORDER — CEPHALEXIN 250 MG PO CAPS
250.0000 mg | ORAL_CAPSULE | Freq: Once | ORAL | Status: AC
  Administered 2012-11-19: 250 mg via ORAL
  Filled 2012-11-18: qty 1

## 2012-11-18 MED ORDER — DIPHENHYDRAMINE HCL 25 MG PO TABS: 25.0000 mg | ORAL_TABLET | Freq: Four times a day (QID) | ORAL | Status: DC

## 2012-11-18 MED ORDER — PREDNISONE 20 MG PO TABS
60.0000 mg | ORAL_TABLET | Freq: Once | ORAL | Status: AC
  Administered 2012-11-19: 60 mg via ORAL
  Filled 2012-11-18 (×2): qty 3

## 2012-11-18 NOTE — ED Provider Notes (Signed)
CSN: [id number]     Arrival date & time 11/18/12  2137 History   None    This chart was scribed for non-physician practitioner, Ivonne Andrew PA-C,  working with Hanley Seamen, MD by Arlan Organ, ED Scribe. This patient was seen in room WTR5/WTR5 and the patient's care was started at 11:17 PM.   Chief Complaint  Patient presents with  . Facial Swelling   The history is provided by the patient. No language interpreter was used.   HPI Comments: Johnny KATICH is a 42 y.o. male who presents to the Emergency Department complaining of gradual onset, unchanged left sided facial swelling that started yesterday. He also reports slight itching of the scalp and face.. Pt denies any injury or insect bites. Pt recently had his hair braided last week, with hair dye used on Thursday but has never experienced these symptoms before. He denies using any other new products, soaps, or detergents. No new foods. Pt denies a hx of sensitive skin. Pt states he is unaware of any known allergies. No other aggravating or alleviating factors. No other associated symptoms. No fever, chills or sweats. Swelling of the mouth, tongue or throat. No difficulty breathing or swallowing.   History reviewed. No pertinent past medical history. Past Surgical History  Procedure Laterality Date  . Trauma surgery     Family History  Problem Relation Age of Onset  . Diabetes Mellitus II Mother   . Asthma Father    History  Substance Use Topics  . Smoking status: Current Every Day Smoker -- 2.00 packs/day    Types: Cigars  . Smokeless tobacco: Never Used     Comment: quit cigarette smoking 4 yrs ago, currently smokes 2 cigars daily  . Alcohol Use: 1.0 oz/week    2 drink(s) per week     Comment: drinks 8 ozs liquor daily, 16 ozs. plus on weekends or when not working, denies withdrawal symptoms-no alcohol consumed for 1 week recently w/o difficulties    Review of Systems  HENT:       Facial swelling  All other systems  reviewed and are negative.    Allergies  Review of patient's allergies indicates no known allergies.  Home Medications   Current Outpatient Rx  Name  Route  Sig  Dispense  Refill  . aspirin 325 MG tablet   Oral   Take 325 mg by mouth every 6 (six) hours as needed for pain.          BP 116/74  Pulse 84  Temp(Src) 98.8 F (37.1 C) (Oral)  Resp 18  Ht 5\' 10"  (1.778 m)  Wt 200 lb (90.719 kg)  BMI 28.7 kg/m2  SpO2 97%  Physical Exam  Nursing note and vitals reviewed. Constitutional: He is oriented to person, place, and time. He appears well-developed and well-nourished.  HENT:  Head: Normocephalic and atraumatic.  Mouth/Throat: Oropharynx is clear and moist.  No swelling to the lips or tongue  Eyes: EOM are normal.  Neck: Normal range of motion.  Cardiovascular: Normal rate.   Pulmonary/Chest: Effort normal. No stridor.  Musculoskeletal: Normal range of motion.  Neurological: He is alert and oriented to person, place, and time.  Skin: Skin is warm and dry.  Diffuse redness and swelling to anterior scalp, greatest on the left side Tight hair braids to the back of the scalp Some mild erythema and swelling into the left face and inferior left periorbital area  Psychiatric: He has a normal mood and affect.  His behavior is normal.    ED Course  Procedures   DIAGNOSTIC STUDIES: Oxygen Saturation is 97% on RA, Normal by my interpretation.    COORDINATION OF CARE: 11:16 PM- Will order steroids. Discussed treatment plan with pt at bedside and pt agreed to plan.       MDM   1. Allergic reaction, initial encounter   2. Contact dermatitis      I personally performed the services described in this documentation, which was scribed in my presence. The recorded information has been reviewed and is accurate.   Angus Seller, PA-C 11/19/12 [phone]

## 2012-11-18 NOTE — ED Notes (Signed)
Pt has noted swelling to left side of face. Denies injury

## 2012-11-18 NOTE — ED Notes (Signed)
Pt not in room after checking several times to give meds. Apparently left AMA.

## 2012-11-19 NOTE — ED Provider Notes (Signed)
Medical screening examination/treatment/procedure(s) were performed by non-physician practitioner and as supervising physician I was immediately available for consultation/collaboration.  EKG Interpretation   None         Hanley Seamen, MD 11/19/12 [phone]

## 2013-07-29 ENCOUNTER — Encounter (HOSPITAL_COMMUNITY): Payer: Self-pay | Admitting: Emergency Medicine

## 2013-07-29 ENCOUNTER — Emergency Department (HOSPITAL_COMMUNITY)
Admission: EM | Admit: 2013-07-29 | Discharge: 2013-07-29 | Disposition: A | Discharge status: Home / Self Care | Payer: 59 | Attending: Emergency Medicine | Admitting: Emergency Medicine

## 2013-07-29 DIAGNOSIS — R631 Polydipsia: Secondary | ICD-10-CM | POA: Insufficient documentation

## 2013-07-29 DIAGNOSIS — F17200 Nicotine dependence, unspecified, uncomplicated: Secondary | ICD-10-CM | POA: Insufficient documentation

## 2013-07-29 DIAGNOSIS — L02419 Cutaneous abscess of limb, unspecified: Secondary | ICD-10-CM | POA: Insufficient documentation

## 2013-07-29 DIAGNOSIS — L03116 Cellulitis of left lower limb: Secondary | ICD-10-CM

## 2013-07-29 DIAGNOSIS — Z792 Long term (current) use of antibiotics: Secondary | ICD-10-CM | POA: Insufficient documentation

## 2013-07-29 DIAGNOSIS — IMO0002 Reserved for concepts with insufficient information to code with codable children: Secondary | ICD-10-CM | POA: Insufficient documentation

## 2013-07-29 DIAGNOSIS — L03119 Cellulitis of unspecified part of limb: Principal | ICD-10-CM

## 2013-07-29 DIAGNOSIS — B353 Tinea pedis: Secondary | ICD-10-CM | POA: Insufficient documentation

## 2013-07-29 DIAGNOSIS — E860 Dehydration: Secondary | ICD-10-CM | POA: Insufficient documentation

## 2013-07-29 DIAGNOSIS — R509 Fever, unspecified: Secondary | ICD-10-CM | POA: Insufficient documentation

## 2013-07-29 LAB — CBC
HCT: 44.8 % (ref 39.0–52.0)
HEMOGLOBIN: 15.2 g/dL (ref 13.0–17.0)
MCH: 29.1 pg (ref 26.0–34.0)
MCHC: 33.9 g/dL (ref 30.0–36.0)
MCV: 85.7 fL (ref 78.0–100.0)
Platelets: 140 10*3/uL — ABNORMAL LOW (ref 150–400)
RBC: 5.23 MIL/uL (ref 4.22–5.81)
RDW: 15.5 % (ref 11.5–15.5)
WBC: 10.9 10*3/uL — ABNORMAL HIGH (ref 4.0–10.5)

## 2013-07-29 LAB — BASIC METABOLIC PANEL
Anion gap: 15 (ref 5–15)
BUN: 10 mg/dL (ref 6–23)
CALCIUM: 9.2 mg/dL (ref 8.4–10.5)
CO2: 25 meq/L (ref 19–32)
CREATININE: 0.93 mg/dL (ref 0.50–1.35)
Chloride: 95 mEq/L — ABNORMAL LOW (ref 96–112)
GFR calc Af Amer: >90 mL/min (ref >90)
GFR calc non Af Amer: >90 mL/min (ref >90)
GLUCOSE: 130 mg/dL — AB (ref 70–99)
Potassium: 3.8 mEq/L (ref 3.7–5.3)
Sodium: 135 mEq/L — ABNORMAL LOW (ref 137–147)

## 2013-07-29 MED ORDER — KETOROLAC TROMETHAMINE 30 MG/ML IJ SOLN
30.0000 mg | Freq: Once | INTRAMUSCULAR | Status: AC
  Administered 2013-07-29: 30 mg via INTRAVENOUS
  Filled 2013-07-29: qty 1

## 2013-07-29 MED ORDER — MORPHINE SULFATE 4 MG/ML IJ SOLN
4.0000 mg | Freq: Once | INTRAMUSCULAR | Status: AC
  Administered 2013-07-29: 4 mg via INTRAVENOUS
  Filled 2013-07-29: qty 1

## 2013-07-29 MED ORDER — NYSTATIN 100000 UNIT/GM EX CREA: TOPICAL_CREAM | CUTANEOUS | Status: DC

## 2013-07-29 MED ORDER — SODIUM CHLORIDE 0.9 % IV BOLUS (SEPSIS)
1000.0000 mL | Freq: Once | INTRAVENOUS | Status: AC
  Administered 2013-07-29: 1000 mL via INTRAVENOUS

## 2013-07-29 MED ORDER — CEFAZOLIN SODIUM 1-5 GM-% IV SOLN
1.0000 g | Freq: Once | INTRAVENOUS | Status: AC
  Administered 2013-07-29: 1 g via INTRAVENOUS
  Filled 2013-07-29: qty 50

## 2013-07-29 MED ORDER — CEPHALEXIN 500 MG PO CAPS: 500.0000 mg | ORAL_CAPSULE | Freq: Four times a day (QID) | ORAL | Status: DC

## 2013-07-29 MED ORDER — HYDROCODONE-ACETAMINOPHEN 5-325 MG PO TABS: 1.0000 | ORAL_TABLET | ORAL | Status: DC | PRN

## 2013-07-29 NOTE — Discharge Instructions (Signed)
Cellulitis Cellulitis is an infection of the skin and the tissue under the skin. The infected area is usually red and tender. This happens most often in the arms and lower legs. HOME CARE   Take your antibiotic medicine as told. Finish the medicine even if you start to feel better.  Keep the infected arm or leg raised (elevated).  Put a warm cloth on the area up to 4 times per day.  Only take medicines as told by your doctor.  Keep all doctor visits as told. GET HELP RIGHT AWAY IF:   You have a fever.  You feel very sleepy.  You throw up (vomit) or have watery poop (diarrhea).  You feel sick and have muscle aches and pains.  You see red streaks on the skin coming from the infected area.  Your red area gets bigger or turns a dark color.  Your bone or joint under the infected area is painful after the skin heals.  Your infection comes back in the same area or different area.  You have a puffy (swollen) bump in the infected area.  You have new symptoms. MAKE SURE YOU:   Understand these instructions.  Will watch your condition.  Will get help right away if you are not doing well or get worse. Document Released: 06/29/2007 Document Revised: 07/12/2011 Document Reviewed: 03/28/2011 [facility] Patient Information 2015 [place], [place]. This information is not intended to replace advice given to you by your health care provider. Make sure you discuss any questions you have with your health care provider.   Emergency Department Resource Guide 1) Find a Doctor and Pay Out of Pocket Although you won't have to find out who is covered by your insurance plan, it is a good idea to ask around and get recommendations. You will then need to call the office and see if the doctor you have chosen will accept you as a new patient and what types of options they offer for patients who are self-pay. Some doctors offer discounts or will set up payment plans for their patients who do not have  insurance, but you will need to ask so you aren't surprised when you get to your appointment.  2) Contact Your Local Health Department Not all health departments have doctors that can see patients for sick visits, but many do, so it is worth a call to see if yours does. If you don't know where your local health department is, you can check in your phone book. The CDC also has a tool to help you locate your state's health department, and many state websites also have listings of all of their local health departments.  3) Find a [place] [facility] If your illness is not likely to be very severe or complicated, you may want to try a walk in clinic. These are popping up all over the country in pharmacies, drugstores, and shopping centers. They're usually staffed by nurse practitioners or physician assistants that have been trained to treat common illnesses and complaints. They're usually fairly quick and inexpensive. However, if you have serious medical issues or chronic medical problems, these are probably not your best option.  No Primary Care Doctor: - Call Health Connect at  [phone] - they can help you locate a primary care doctor that  accepts your insurance, provides certain services, etc. - Physician Referral Service- [phone]  Chronic Pain Problems: Organization         Address  Phone   Notes  [facility]  [phone] Patients need to be referred by their primary care doctor.   Medication Assistance: Organization         Address  Phone   Notes  [facility] Medication [facility] [address] [place], [place] [address] [phone] --Must be a resident of [facility] -- Must have NO insurance coverage whatsoever (no Medicaid/ Medicare, etc.) -- The pt. MUST have a primary care doctor that directs their care regularly and follows them in the community   MedAssist  [phone]   [employer]  [phone]    Agencies that provide  inexpensive medical care: Organization         Address  Phone   Notes  [place]  [phone]   [facility]    [phone]   [facility] [place], [place] [address] [phone]   [place] [address], [place] [phone]   Planned Parenthood    [phone]   [facility]    [phone]   [place] and [place], [place] Phone:  [phone], Fax:  [phone] Hours of Operation:  9 am - 6 pm, M-F.  Also accepts Medicaid/Medicare and self-pay.  [facility] for [place], [address], [place] Phone: [phone], Fax: [phone]. Hours of Operation:  8:30 am - 5:30 pm, M-F.  Also accepts Medicaid and self-pay.  [facility] [place] [address], [place] Phone: [phone]   [place], [place], [place] [phone], Ext. [phone] Mondays & Thursdays: 7-9 AM.  First 15 patients are seen on a first come, first serve basis.    [place] Providers:  Organization         Address  Phone   Notes  [facility] [address], [place] [phone] Also accepts self-pay patients.  [facility] [id number] [place], [place]  [phone]   [place], [address], [place] [phone]   [facility] [address], [place] [phone]   Lucianne Lei [address], [place]   [phone] Only accepts [place] Access [place] patients after they have their name applied to their card.   Self-Pay (no insurance) in [facility]:  Organization         Address  Phone   Notes  Sickle Cell Patients, [facility] [place] [phone]   [facility] Urgent  Care [place] [phone]   [facility] [place]  [place], [place], [place] [phone]   [facility]/Dr. Osei-Bonsu  [address] [place] or [address], [place] [phone] Phone number for both [place] and [place] locations is the same.  Urgent Medical and [facility] [address] [place] [phone]   [facility] [address] [place] or [address] [phone]   [facility] [address] [place] [phone], phone; [phone], fax Sees patients 1st and 3rd Saturday of every month.  Must not qualify for public or private insurance (i.e. Medicaid, Medicare, [place] Health Choice, Veterans' Benefits)  Household income should be no more than 200% of the poverty level The clinic cannot treat you if you are pregnant or think you are pregnant  Sexually transmitted diseases are not treated at the clinic.   Dental Care: Organization         Address  Phone  Notes  [facility] Department of [facility] [phone] Accepts children up to age 40 who are enrolled in [place] or [place]; pregnant women with a Medicaid card; and children who have applied for Medicaid or [place] Health Choice, but were declined, whose parents can pay a reduced fee at time of service.  [facility] Department of [facility]  [address], [place] [phone] Accepts children up to age 82 who are enrolled in [place] or [place]; pregnant women with a Medicaid card; and children who have applied for Medicaid or [place] Health Choice, but were declined, whose parents can pay a reduced fee at time of service.  [place] Adult Dental Access PROGRAM  [place] [phone] Patients are seen by appointment only. Walk-ins are not  accepted. [place] will see patients 74 years of age and older. Monday - Tuesday (8am-5pm) Most Wednesdays (8:30-5pm) $30 per visit, cash only  [facility] Adult Dental Access PROGRAM  [address], [facility] [phone] Patients are seen by appointment only. Walk-ins are not accepted. [place] will see patients 66 years of age and older. One Wednesday Evening (Monthly: Volunteer Based).  $30 per visit, cash only  [place]  [phone] for adults; Children under age 55, call Graduate Pediatric Dentistry at [phone]. Children aged 49-14, please call [phone] to request a pediatric application.  Dental services are provided in all areas of dental care including fillings, crowns and bridges, complete and partial dentures, implants, gum treatment, root canals, and extractions. Preventive care is also provided. Treatment is provided to both adults and children. Patients are selected via a lottery and there is often a waiting list.   [facility] [address] [place]  [phone] [email or web address]   [facility] [address] [place], [place] [phone], Ext. [phone] Second and Fourth Thursday of each month, opens at 6:30 AM; Clinic ends at 9 AM.  Patients are seen on a first-come first-served basis, and a limited number are seen during each clinic.   [facility]  [address] Hillard Danker [place], [place] [phone]   Eligibility Requirements You must have lived in [place], [place], or [place] counties for at least the last three months.   You cannot be eligible for state or federal sponsored [employer], including [employer], [place], or [employer].   You generally cannot be eligible for healthcare insurance through your employer.    How to apply: Eligibility screenings are held every Tuesday and Wednesday afternoon from 1:00 pm until 4:00 pm. You do not need an appointment for the  interview!  [facility] [address] [place], [place]   [place]  [place] Department  [place]  [phone]    Behavioral Health Resources in the Community: Intensive Outpatient Programs Organization         Address  Phone  Notes  [place]. [address] [place], [place]  [phone]   [facility] Outpatient [address], [place], [place]   ADS: Alcohol & Drug Svcs [address] [place], [place]   [place] [address]  [place], [place] or [phone]   Substance Abuse Resources Organization         Address  Phone  Notes  Alcohol and Drug Services  [phone]   [place]  [phone]   [place]   Chinita Pester  [phone]   Residential & Outpatient Substance Abuse Program  [phone]   Psychological Services Organization         Address  Phone  Notes  [facility]  [place]  [phone]   [place] [address], [place] or [phone]    Mobile Crisis Teams Organization         Address  Phone  Notes  [facility], Mobile Crisis Care Unit  [phone]   [facility]  [address] [place], [place]   [place] [address] [place] [phone]    Self-Help/Support Groups Organization         Address  Phone             Notes  [place]. of [place] - variety of support groups  [place] Call for more information  Narcotics Anonymous (NA), [facility]aring [facility] [address], [employer] [place]  2 meetings at this location   Special educational needs teacher         Address  Phone  Notes  ASAP Residential Treatment  [place],    [place]  [phone]   [facility]  [address], [place] [address], [place], [place]   [place], [place] [phone] Admissions: 8am-3pm M-F  Incentives Substance [place] [address]    [place], [place] [phone]   [facility] [address] [place], [place], [place]   [facility] [address]  [place], [place]   [facility] Programs - Intensive Outpatient [address] Kristeen [address], [place], [place]   [facility] ([place] [place].,  [place], [place] [phone] or [phone]   [facility]idential Treatment Services (RTS) [address], [place], [place] Accepts Medicaid  [facility] [place] [address]  [place] [phone] Substance Abuse/Addiction Treatment   [facility] Organization         Address  Phone  Notes  [facility]  [phone]   Domenic Schwab, PhD [address] Arlis Porta [place], [place]   [phone] or [phone]   [place], [place] [phone]   [facility] [address] [place], [place] [phone] Insurance/Medicaid/sponsorship through [facility] and Families [address]                                    [place], [place] [phone] [place] [address], [place] [phone]    Dr. Adele Schilder  [phone]   [facility] Dept. 1) [address], [place] 2) [place],  [place] 3)  [place] [address], [place] [phone]  [phone]   [facility] Child Abuse Hotline [phone] or [phone] (After Hours)

## 2013-07-29 NOTE — ED Provider Notes (Signed)
CSN: [id number]     Arrival date & time 07/29/13  1818 History   First MD Initiated Contact with Patient 07/29/13 2126     Chief Complaint  Patient presents with  . Cellulitis  . Dehydration    HPI Pt developed pain and swelling of his left leg.  The symptoms have progressed.  He has felt feverish and chilled.  He did not measure his temperature but feels that it must have been at least 105.  No vomiting or diarrhea but he has had increased thirst and has felt dehydrated.  No cough.  No sore throat.   History reviewed. No pertinent past medical history. Past Surgical History  Procedure Laterality Date  . Trauma surgery     Family History  Problem Relation Age of Onset  . Diabetes Mellitus II Mother   . Asthma Father    History  Substance Use Topics  . Smoking status: Current Every Day Smoker -- 2.00 packs/day    Types: Cigars  . Smokeless tobacco: Never Used     Comment: quit cigarette smoking 4 yrs ago, currently smokes 2 cigars daily  . Alcohol Use: 1.0 oz/week    2 drink(s) per week     Comment: drinks 8 ozs liquor daily, 16 ozs. plus on weekends or when not working, denies withdrawal symptoms-no alcohol consumed for 1 week recently w/o difficulties    Review of Systems  All other systems reviewed and are negative.     Allergies  Review of patient's allergies indicates no known allergies.  Home Medications   Prior to Admission medications   Medication Sig Start Date End Date Taking? Authorizing Provider  aspirin 325 MG tablet Take 325 mg by mouth every 6 (six) hours as needed for pain.    Historical Provider, MD  cephALEXin (KEFLEX) 500 MG capsule Take 1 capsule (500 mg total) by mouth 4 (four) times daily. 11/18/12   Ruthell Rummage Dammen, PA-C  diphenhydrAMINE (BENADRYL) 25 MG tablet Take 1 tablet (25 mg total) by mouth every 6 (six) hours. 11/18/12   Ruthell Rummage Dammen, PA-C  predniSONE (DELTASONE) 10 MG tablet Take 2 tablets (20 mg total) by mouth daily. 11/18/12   Ruthell Rummage  Dammen, PA-C   BP 114/75  Pulse 111  Temp(Src) 99 F (37.2 C) (Oral)  Resp 17  Ht 5\' 10"  (1.778 m)  Wt 200 lb (90.719 kg)  BMI 28.70 kg/m2  SpO2 96% Physical Exam  Nursing note and vitals reviewed. Constitutional: He appears well-developed and well-nourished. No distress.  HENT:  Head: Normocephalic and atraumatic.  Right Ear: External ear normal.  Left Ear: External ear normal.  Eyes: Conjunctivae are normal. Right eye exhibits no discharge. Left eye exhibits no discharge. No scleral icterus.  Neck: Neck supple. No tracheal deviation present.  Cardiovascular: Normal rate, regular rhythm and intact distal pulses.   Pulmonary/Chest: Effort normal and breath sounds normal. No stridor. No respiratory distress. He has no wheezes. He has no rales.  Abdominal: Soft. Bowel sounds are normal. He exhibits no distension. There is no tenderness. There is no rebound and no guarding.  Musculoskeletal: He exhibits edema and tenderness.  Chronic scaling bilateral feet c/w tinea, erythema medial aspect left calf, ttp, increased warmth  Neurological: He is alert. He has normal strength. No cranial nerve deficit (no facial droop, extraocular movements intact, no slurred speech) or sensory deficit. He exhibits normal muscle tone. He displays no seizure activity. Coordination normal.  Skin: Skin is warm and dry. No rash  noted.  Psychiatric: He has a normal mood and affect.    ED Course  Procedures (including critical care time) Labs Review Labs Reviewed  CBC - Abnormal; Notable for the following:    WBC 10.9 (*)    Platelets 140 (*)    All other components within normal limits  BASIC METABOLIC PANEL - Abnormal; Notable for the following:    Sodium 135 (*)    Chloride 95 (*)    Glucose, Bld 130 (*)    All other components within normal limits   Medications  morphine 4 MG/ML injection 4 mg (4 mg Intravenous Given 07/29/13 2206)  ceFAZolin (ANCEF) IVPB 1 g/50 mL premix (0 g Intravenous Stopped  07/29/13 2311)  sodium chloride 0.9 % bolus 1,000 mL (1,000 mLs Intravenous New Bag/Given 07/29/13 2201)  ketorolac (TORADOL) 30 MG/ML injection 30 mg (30 mg Intravenous Given 07/29/13 2237)     MDM   Final diagnoses:  Cellulitis of left lower extremity  Tinea pedis of both feet     I suspect that the patient's source of infection is related to his chronic-appearing tinea pedis.  Patient does appear to have a cellulitis of his left lower extremity.  laboratory tests are reassuring. He is afebrile here and appears in no distress.  I will discharge him home on oral antibiotics as well as antifungal agents. Recommend followup with a primary care doctor  Dorie Rank, MD 07/29/13 2329

## 2013-07-29 NOTE — ED Notes (Signed)
He states "i have cellulitis and im dehydrated." hes noticed a red painful area to L inner calf this week and also states "my skin is so dry, i must be dehydrated. Johnny Mooney been trying to drink a lot of water."

## 2013-09-23 ENCOUNTER — Encounter (HOSPITAL_COMMUNITY): Payer: Self-pay | Admitting: Emergency Medicine

## 2013-09-23 ENCOUNTER — Emergency Department (HOSPITAL_COMMUNITY)
Admission: EM | Admit: 2013-09-23 | Discharge: 2013-09-23 | Disposition: A | Discharge status: Home / Self Care | Payer: 59 | Attending: Emergency Medicine | Admitting: Emergency Medicine

## 2013-09-23 ENCOUNTER — Emergency Department (HOSPITAL_COMMUNITY): Payer: 59

## 2013-09-23 DIAGNOSIS — L02612 Cutaneous abscess of left foot: Secondary | ICD-10-CM

## 2013-09-23 DIAGNOSIS — M79609 Pain in unspecified limb: Secondary | ICD-10-CM | POA: Insufficient documentation

## 2013-09-23 DIAGNOSIS — L03119 Cellulitis of unspecified part of limb: Principal | ICD-10-CM

## 2013-09-23 DIAGNOSIS — F17200 Nicotine dependence, unspecified, uncomplicated: Secondary | ICD-10-CM | POA: Insufficient documentation

## 2013-09-23 DIAGNOSIS — L02619 Cutaneous abscess of unspecified foot: Secondary | ICD-10-CM | POA: Insufficient documentation

## 2013-09-23 LAB — CBG MONITORING, ED: Glucose-Capillary: 118 mg/dL — ABNORMAL HIGH (ref 70–99)

## 2013-09-23 MED ORDER — SULFAMETHOXAZOLE-TRIMETHOPRIM 800-160 MG PO TABS: 2.0000 | ORAL_TABLET | Freq: Two times a day (BID) | ORAL | Status: DC

## 2013-09-23 NOTE — ED Notes (Signed)
Patient here with complaint of left foot pain, swelling, and drainage. States that his foot began to swell Friday night and a clear puss pocket formed. The area is located on the medial aspect of the foot and appears to be actively draining. Patient observed ambulating around in waiting area prior to presenting to triage for evaluation. Denies history of diabetes, but states strong family history.

## 2013-09-23 NOTE — ED Notes (Signed)
Clean dressing applied to left foot.

## 2013-09-23 NOTE — ED Notes (Signed)
CBG 118 

## 2013-09-23 NOTE — ED Provider Notes (Signed)
CSN: [id number]     Arrival date & time 09/23/13  0263 History   First MD Initiated Contact with Patient 09/23/13 [phone]     Chief Complaint  Patient presents with  . Abscess  . Foot Pain     (Consider location/radiation/quality/duration/timing/severity/associated sxs/prior Treatment) HPI Comments: Patient presents today with an abscess to his left foot.  He reports that the abscess has been present for the past 4 days, but began draining purulent fluid yesterday.  He reports that he has had similar abscesses of the foot in the past.  He denies fever, chills, nausea, vomiting, numbness, or tingling.  He denies history of DM or HIV.  No treatment prior to arrival.  The history is provided by the patient.    History reviewed. No pertinent past medical history. Past Surgical History  Procedure Laterality Date  . Trauma surgery     Family History  Problem Relation Age of Onset  . Diabetes Mellitus II Mother   . Asthma Father    History  Substance Use Topics  . Smoking status: Current Every Day Smoker -- 0.33 packs/day    Types: Cigars, Cigarettes  . Smokeless tobacco: Never Used     Comment: quit cigarette smoking 4 yrs ago, currently smokes 2 cigars daily  . Alcohol Use: 1.0 oz/week    2 drink(s) per week     Comment: drinks 8 ozs liquor daily, 16 ozs. plus on weekends or when not working, denies withdrawal symptoms-no alcohol consumed for 1 week recently w/o difficulties    Review of Systems  All other systems reviewed and are negative.     Allergies  Review of patient's allergies indicates no known allergies.  Home Medications   Prior to Admission medications   Medication Sig Start Date End Date Taking? Authorizing Provider  ibuprofen (ADVIL,MOTRIN) 200 MG tablet Take 200 mg by mouth every 6 (six) hours as needed for mild pain.    Yes Historical Provider, MD   BP 103/62  Pulse 68  Temp(Src) 98.1 F (36.7 C) (Oral)  Resp 18  Ht 5\' 10"  (1.778 m)  Wt 180 lb (81.647  kg)  BMI 25.83 kg/m2  SpO2 98% Physical Exam  Nursing note and vitals reviewed. Constitutional: He appears well-developed and well-nourished.  HENT:  Head: Normocephalic and atraumatic.  Neck: Normal range of motion.  Cardiovascular: Normal rate, regular rhythm and normal heart sounds.   Pulses:      Dorsalis pedis pulses are 2+ on the right side, and 2+ on the left side.  Pulmonary/Chest: Effort normal and breath sounds normal.  Musculoskeletal:       Right ankle: He exhibits normal range of motion and no swelling.  1 cm open draining abscess of the left medial dorsal foot.  No surrounding erythema or edema.  No erythematous streaking.    Diffuse scaling of both feet  Neurological: He is alert.  Distal sensation of left foot intact  Skin: Skin is warm and dry.    ED Course  Procedures (including critical care time) Labs Review Labs Reviewed  CBG MONITORING, ED    Imaging Review Dg Foot Complete Left  09/23/2013   CLINICAL DATA:  Medial mid left foot pain, draining abscess.  EXAM: LEFT FOOT - COMPLETE 3+ VIEW  COMPARISON:  None.  FINDINGS: No displaced fracture or dislocation. No aggressive osseous lesion. Moderate first TMT and mild first MTP and interphalangeal degenerative changes. Rounded lucency within the proximal phalanx first digit is without overt aggressive change. Lisfranc  joint intact. There is soft tissue prominence along the medial margin of the midfoot with skin irregularity/ulceration  IMPRESSION: Soft tissue swelling overlies the medial aspect of the midfoot, with ulceration/air just deep to the skin surface, in keeping with the stated history of draining abscess.  No acute osseous finding.  Degenerative changes of the first digit.   Electronically Signed   By: Carlos Levering M.D.   On: 09/23/2013 06:49     EKG Interpretation None      MDM   Final diagnoses:  None   Patient presenting with an abscess of his left foot.  Abscess is completely open and  draining purulent fluid.  Xray showing no evidence of Osteomyelitis.  No history of DM.  CBG is 118 today.  Patient is neurovascularly intact.  Patient afebrile and non toxic appearing.  Feel that the patient is stable for discharge.  Patient given referral to PCP and Wound Management.  Patient also instructed to follow up in 2 days for recheck if unable to follow up with PCP or Wound Management before then.  Return precautions given.    Hyman Bible, PA-C 09/23/13 [phone]

## 2013-09-23 NOTE — ED Provider Notes (Signed)
Medical screening examination/treatment/procedure(s) were performed by non-physician practitioner and as supervising physician I was immediately available for consultation/collaboration.   EKG Interpretation None        Merryl Hacker, MD 09/23/13 1409

## 2015-05-19 ENCOUNTER — Encounter (HOSPITAL_COMMUNITY): Payer: Self-pay | Admitting: Emergency Medicine

## 2015-05-19 ENCOUNTER — Emergency Department (HOSPITAL_COMMUNITY)
Admission: EM | Admit: 2015-05-19 | Discharge: 2015-05-19 | Disposition: A | Discharge status: Home / Self Care | Payer: Self-pay | Attending: Emergency Medicine | Admitting: Emergency Medicine

## 2015-05-19 DIAGNOSIS — L03116 Cellulitis of left lower limb: Secondary | ICD-10-CM | POA: Insufficient documentation

## 2015-05-19 DIAGNOSIS — F17210 Nicotine dependence, cigarettes, uncomplicated: Secondary | ICD-10-CM | POA: Insufficient documentation

## 2015-05-19 LAB — BASIC METABOLIC PANEL
Anion gap: 10 (ref 5–15)
BUN: 11 mg/dL (ref 6–20)
CHLORIDE: 103 mmol/L (ref 101–111)
CO2: 28 mmol/L (ref 22–32)
Calcium: 9.1 mg/dL (ref 8.9–10.3)
Creatinine, Ser: 0.8 mg/dL (ref 0.61–1.24)
GFR calc Af Amer: >60 mL/min (ref >60)
GFR calc non Af Amer: >60 mL/min (ref >60)
GLUCOSE: 110 mg/dL — AB (ref 65–99)
POTASSIUM: 3.5 mmol/L (ref 3.5–5.1)
Sodium: 141 mmol/L (ref 135–145)

## 2015-05-19 LAB — CBC WITH DIFFERENTIAL/PLATELET
Basophils Absolute: 0 10*3/uL (ref 0.0–0.1)
Basophils Relative: 0 %
EOS PCT: 2 %
Eosinophils Absolute: 0.1 10*3/uL (ref 0.0–0.7)
HCT: 41.2 % (ref 39.0–52.0)
HEMOGLOBIN: 14.3 g/dL (ref 13.0–17.0)
Lymphocytes Relative: 37 %
Lymphs Abs: 2.9 10*3/uL (ref 0.7–4.0)
MCH: 28.7 pg (ref 26.0–34.0)
MCHC: 34.7 g/dL (ref 30.0–36.0)
MCV: 82.7 fL (ref 78.0–100.0)
MONOS PCT: 10 %
Monocytes Absolute: 0.8 10*3/uL (ref 0.1–1.0)
Neutro Abs: 3.9 10*3/uL (ref 1.7–7.7)
Neutrophils Relative %: 51 %
PLATELETS: 208 10*3/uL (ref 150–400)
RBC: 4.98 MIL/uL (ref 4.22–5.81)
RDW: 14.6 % (ref 11.5–15.5)
WBC: 7.7 10*3/uL (ref 4.0–10.5)

## 2015-05-19 MED ORDER — SODIUM CHLORIDE 0.9 % IV BOLUS (SEPSIS)
1000.0000 mL | Freq: Once | INTRAVENOUS | Status: AC
  Administered 2015-05-19: 1000 mL via INTRAVENOUS

## 2015-05-19 MED ORDER — HYDROCODONE-ACETAMINOPHEN 5-325 MG PO TABS
1.0000 | ORAL_TABLET | Freq: Once | ORAL | Status: AC
  Administered 2015-05-19: 1 via ORAL
  Filled 2015-05-19: qty 1

## 2015-05-19 MED ORDER — CEPHALEXIN 500 MG PO CAPS: 500.0000 mg | ORAL_CAPSULE | Freq: Four times a day (QID) | ORAL | Status: DC

## 2015-05-19 MED ORDER — CEFAZOLIN SODIUM 1-5 GM-% IV SOLN
1.0000 g | Freq: Once | INTRAVENOUS | Status: AC
  Administered 2015-05-19: 1 g via INTRAVENOUS
  Filled 2015-05-19: qty 50

## 2015-05-19 NOTE — ED Provider Notes (Signed)
CSN: [id number]     Arrival date & time 05/19/15  0206 History   First MD Initiated Contact with Patient 05/19/15 0430     Chief Complaint  Patient presents with  . Cellulitis     (Consider location/radiation/quality/duration/timing/severity/associated sxs/prior Treatment) HPI Comments: Patient presents with 12 hour history of redness to his left lower extremity and ankle. Patient has a history of cellulitis in his lower leg. Last episode in our records was 07/2013. Patient was treated with Keflex as outpatient after ED dose of Ancef with good response and improvement in his symptoms. Patient denies fever or chills. No nausea, vomiting, abdominal pain. No history of immunocompromise or diabetes. Patient denies injury to the area. The onset of this condition was acute. The course is constant. Aggravating factors: none. Alleviating factors: none.    The history is provided by the patient.    Past Medical History  Diagnosis Date  . Cellulitis    Past Surgical History  Procedure Laterality Date  . Trauma surgery     Family History  Problem Relation Age of Onset  . Diabetes Mellitus II Mother   . Asthma Father    Social History  Substance Use Topics  . Smoking status: Current Every Day Smoker -- 0.33 packs/day    Types: Cigars  . Smokeless tobacco: Never Used     Comment: quit cigarette smoking 4 yrs ago, currently smokes 2 cigars daily  . Alcohol Use: 1.0 oz/week    2 Standard drinks or equivalent per week     Comment: drinks 8 ozs liquor daily, 16 ozs. plus on weekends or when not working, denies withdrawal symptoms-no alcohol consumed for 1 week recently w/o difficulties    Review of Systems  Constitutional: Negative for fever.  HENT: Negative for rhinorrhea and sore throat.   Eyes: Negative for redness.  Respiratory: Negative for cough.   Cardiovascular: Negative for chest pain.  Gastrointestinal: Negative for nausea, vomiting, abdominal pain and diarrhea.  Genitourinary:  Negative for dysuria.  Musculoskeletal: Negative for myalgias.  Skin: Positive for color change.  Neurological: Negative for headaches.      Allergies  Review of patient's allergies indicates no known allergies.  Home Medications   Prior to Admission medications   Medication Sig Start Date End Date Taking? Authorizing Provider  ibuprofen (ADVIL,MOTRIN) 200 MG tablet Take 200 mg by mouth every 6 (six) hours as needed for mild pain.    Yes Historical Provider, MD  sulfamethoxazole-trimethoprim (SEPTRA DS) 800-160 MG per tablet Take 2 tablets by mouth 2 (two) times daily. Patient not taking: Reported on 05/19/2015 09/23/13   Hyman Bible, PA-C   BP 109/83 mmHg  Pulse 85  Temp(Src) 98.1 F (36.7 C) (Oral)  Resp 18  Ht 5\' 10"  (1.778 m)  Wt 82.101 kg  BMI 25.97 kg/m2  SpO2 97% Physical Exam  Constitutional: He appears well-developed and well-nourished.  HENT:  Head: Normocephalic and atraumatic.  Eyes: Conjunctivae are normal. Right eye exhibits no discharge. Left eye exhibits no discharge.  Neck: Normal range of motion. Neck supple.  Cardiovascular: Normal rate, regular rhythm and normal heart sounds.   Pulmonary/Chest: Effort normal and breath sounds normal.  Abdominal: Soft. There is no tenderness.  Neurological: He is alert.  Skin: Skin is warm and dry.  Patient well with a well-demarcated area of erythema and warmth of the left ankle extending onto the anterior lower leg consistent with cellulitis. No associated swelling. Area is mildly warm to touch. Minimal tenderness. No lymphangitis noted.  Chronic scaling noted to bilateral feet without associated infection on the feet.  Psychiatric: He has a normal mood and affect.  Nursing note and vitals reviewed.   ED Course  Procedures (including critical care time) Labs Review Labs Reviewed  BASIC METABOLIC PANEL - Abnormal; Notable for the following:    Glucose, Bld 110 (*)    All other components within normal limits   CBC WITH DIFFERENTIAL/PLATELET    Patient seen and examined. Work-up initiated. Medications ordered.   Vital signs reviewed and are as follows: BP 109/83 mmHg  Pulse 85  Temp(Src) 98.1 F (36.7 C) (Oral)  Resp 18  Ht 5\' 10"  (1.778 m)  Wt 82.101 kg  BMI 25.97 kg/m2  SpO2 97%  6:48 AM patient has received IV fluids, IV Ancef. He feels well. No fever. We reviewed lab results which are reassuring. Patient encouraged to follow-up with PCP for recheck in the next 48 hours. He states that he is comfortable with discharge to home at this time. Pt urged to return with worsening pain, worsening swelling, expanding area of redness or streaking up extremity, fever, or any other concerns. Urged to take complete course of antibiotics as prescribed. Pt verbalizes understanding and agrees with plan.   MDM   Final diagnoses:  Cellulitis of left lower extremity   Patient with acute onset of cellulitis to the left lower extremity. Patient does not have any systemic symptoms of illness. White blood cell count is normal. No fever. Patient has had exact same symptoms in the past and has responded well to oral antibiotics. No history of diabetes, PAD or immune compromise. No indications for admission at this point, however patient will be need to monitor his symptoms closely. He seems reliable to return if his symptoms do worsen.   Carlisle Cater, PA-C 05/19/15 [place], MD 05/22/15 1019

## 2015-05-19 NOTE — Discharge Instructions (Signed)
Please read and follow all provided instructions.  Your diagnoses today include:  1. Cellulitis of left lower extremity     Tests performed today include:  Vital signs. See below for your results today.   Blood counts and electrolytes   Medications prescribed:   Keflex (cephalexin) - antibiotic  You have been prescribed an antibiotic medicine: take the entire course of medicine even if you are feeling better. Stopping early can cause the antibiotic not to work.  Take any prescribed medications only as directed.   Home care instructions:  Follow any educational materials contained in this packet. Keep affected area above the level of your heart when possible. Wash area gently twice a day with warm soapy water. Do not apply alcohol or hydrogen peroxide. Cover the area if it draining or weeping.   Follow-up instructions: Return to the Emergency Department in 48 hours for a recheck if your symptoms are not significantly improved.   Please follow-up with your primary care provider in the next 1 week for further evaluation of your symptoms.   Return instructions:  Return to the Emergency Department if you have:  Fever  Worsening symptoms  Worsening pain  Worsening swelling  Redness of the skin that moves away from the affected area, especially if it streaks away from the affected area   Any other emergent concerns  Your vital signs today were: BP 109/83 mmHg   Pulse 85   Temp(Src) 98.1 F (36.7 C) (Oral)   Resp 18   Ht 5\' 10"  (1.778 m)   Wt 82.101 kg   BMI 25.97 kg/m2   SpO2 97% If your blood pressure (BP) was elevated above 135/85 this visit, please have this repeated by your doctor within one month. -------------- [place] [address] is a great source of information about community services available.  Access by dialing 2-1-1 from anywhere in [place], or by website -  CustodianSupply.fi.   Other Local Resources (Updated  01/2015)  [place]    Phone Number and Address  [place] medical care - 1st and 3rd Saturday of every month  Must not qualify for public or private insurance and must have limited income [phone] [address], [place]  Child care  Emergency assistance for housing and [employer]  Medicaid [phone] [address], [place] [address]   [facility] Department  Low-cost medical care for children, communicable diseases, sexually-transmitted diseases, immunizations, maternity care, womens health and family planning [phone] [address], [place] [address]  [facility]   Medication assistance for [facility] residents  Must meet income requirements [phone] [place], [place].    [place]  Child care  Emergency assistance for housing and [employer]  Medicaid [phone] [address] [place], [place] [address]  [facility] [place]   Low-cost medical care,   Monday through Friday, 9 am to 6 pm.   Accepts Medicare/Medicaid, and self-pay [phone] [address] [place], [place] [address]  [facility] for [place] care - Monday through Friday, 8:30 am - 5:30 pm  Accepts Medicaid and self-pay [phone] [address], [place], [place] [address]   [facility]  Primary medical care, including for those with sickle cell  disease  Accepts Medicare, Medicaid, insurance and self-pay [phone] [address], [place]  [facility]   Primary medical care  Accepts Medicare, [place], insurance and self-pay [phone] [address], [place], [place] [address]    [facility] Department of Social Services  Child care  Emergency assistance for housing and [employer]  Medicaid [phone] [address] [place], [place] [address]  [place] Department of Health and [employer]  Child care  Emergency assistance for housing and [employer]  Medicaid [phone] [place], [place] [address]   [facility] Medication Assistance Program  Medication assistance for [facility] residents with no insurance only  Must have a primary care doctor [phone] [address], [place], [place]  [facility]   Primary medical care  [place], [place], insurance  [phone] [address] [place], [place]  [facility]   Medication assistance [phone]  [facility]   Primary medical care  Accepts Medicare, [place], insurance and self-pay [phone] [address], [place] [address]  [facility]   Primary medical care  Accepts Medicare, [place], insurance and self-pay [phone] [address], [place] [address]  [facility]  For [place] residents between the ages of 55 and 24 who do not have any form of health insurance, Medicare, [place], or [place] benefits.  Services are provided free of charge to uninsured patients who fall within federal poverty guidelines.    Hours: Tuesdays and Thursdays, 4:15 - 8 pm [phone] [address] [place], [place] [address]  [facility]     Primary medical care  Dental care  Nutritional counseling  Pharmacy  Accepts Medicaid, Medicare, most insurance.  Fees are adjusted based on ability to pay.   [place], [place] [address],  [place], [facility], [place], [facility] [place], [place]  [facility]  Womens health and family planning [phone] [place]. [place], [place] care  Emergency assistance for housing and [employer]  Medicaid [phone] [address] [place], [place] [address]   [facility]    Ages 8 and older  Hours: Mondays and Thursdays, 7:00 am - 9:00 am Patients are seen on a first come, first served basis. [phone], ext. [place], [place]  Child care  Emergency assistance for housing and [employer]  Medicaid [phone] [address], [place] [address]  [facility]  Medication assistance  Rental assistance  Food pantry  Medication assistance  Housing assistance  Emergency food distribution  Utility assistance [place], [place]  [place] [place], [place] [address] Hours: Tuesdays and Thursdays from 9am - 12 noon by appointment only  [place], [place] [address]  [facility] [place] private insurance, [place], and [place].  Payment is based on a sliding scale for those without insurance.  Hours: Mondays, Tuesdays and Thursdays, 8:30 am - 5:30 pm.  [place], [place]  [facility] - Family Medicine at [facility], [place], and [place].  Payment is based on a sliding scale for those without insurance. [phone] [address] [place], [place]  [facility] - Pediatrics at [address]  Scientist, research (medical), [employer], and [place].  Payment is based on a sliding scale for those without insurance [phone] [address], [employer], [place]  [facility] - Pediatrics at [employer], [place], and [place].  Payment is based on a sliding scale for those without insurance. [phone] [place], [place]  [facility] - Pediatrics at [facility], [place], and [place].  Payment is based on a sliding scale for those without insurance. [phone], ext. [phone] [place], [place].    [place] care.  Accepts Medicaid and self-pay. [place], [place]

## 2015-05-19 NOTE — ED Notes (Signed)
Pt states he thinks he has cellulitis of his left lower extremity  Pt states he has hx of same  Pt has discoloration of his left lower extremity noted   Pt states this has happened over the past 5 hours

## 2015-08-21 ENCOUNTER — Encounter (HOSPITAL_COMMUNITY): Payer: Self-pay

## 2015-08-21 ENCOUNTER — Emergency Department (HOSPITAL_COMMUNITY)
Admission: EM | Admit: 2015-08-21 | Discharge: 2015-08-21 | Disposition: A | Discharge status: Home / Self Care | Payer: Self-pay | Attending: Emergency Medicine | Admitting: Emergency Medicine

## 2015-08-21 DIAGNOSIS — F1290 Cannabis use, unspecified, uncomplicated: Secondary | ICD-10-CM | POA: Insufficient documentation

## 2015-08-21 DIAGNOSIS — L0291 Cutaneous abscess, unspecified: Secondary | ICD-10-CM

## 2015-08-21 DIAGNOSIS — Z792 Long term (current) use of antibiotics: Secondary | ICD-10-CM | POA: Insufficient documentation

## 2015-08-21 DIAGNOSIS — F17210 Nicotine dependence, cigarettes, uncomplicated: Secondary | ICD-10-CM | POA: Insufficient documentation

## 2015-08-21 DIAGNOSIS — L02811 Cutaneous abscess of head [any part, except face]: Secondary | ICD-10-CM | POA: Insufficient documentation

## 2015-08-21 MED ORDER — LIDOCAINE HCL 2 % IJ SOLN
0.0000 mL | Freq: Once | INTRAMUSCULAR | Status: DC | PRN
  Filled 2015-08-21: qty 20

## 2015-08-21 MED ORDER — SULFAMETHOXAZOLE-TRIMETHOPRIM 800-160 MG PO TABS: 2.0000 | ORAL_TABLET | Freq: Two times a day (BID) | ORAL | 0 refills | Status: AC

## 2015-08-21 NOTE — Progress Notes (Signed)
Pt has an abcess on the back of the right side of his scalp which he states began yesterday. The abcess is the size of a tennis ball. Pt states he did get a haircut yesterday. Pt admits he has had abscesses here before.

## 2015-08-21 NOTE — ED Provider Notes (Signed)
[facility] DEPT Provider Note   CSN: [id number] Arrival date & time: 08/21/15  [id number]  First Provider Contact:   First MD Initiated Contact with Patient 08/21/15 1927        By signing my name below, I, Johnny Mooney, attest that this documentation has been prepared under the direction and in the presence of  Johnny Bible, PA-C. Electronically Signed: Hansel Mooney, ED Scribe. 08/21/15. 7:59 PM.   History   Chief Complaint Chief Complaint  Patient presents with  . Abscess    HPI [name] is a 45 y.o. male who presents to the Emergency Department complaining of a moderate, gradually worsening area of pain and swelling to the posterior scalp onset 1.5 days ago. Pt states pain is worsened with palpation. Pt reports that he shaves his head regularly. Pt states he has previously had a similar area on his head that did not require lancing. He denies fever, chills, nausea, emesis, drainage from the area.    The history is provided by the patient. No language interpreter was used.    Past Medical History:  Diagnosis Date  . Cellulitis     Patient Active Problem List   Diagnosis Date Noted  . Cellulitis 02/20/2012  . Tobacco abuse 02/20/2012    Past Surgical History:  Procedure Laterality Date  . trauma surgery        Home Medications    Prior to Admission medications   Medication Sig Start Date End Date Taking? Authorizing Provider  cephALEXin (KEFLEX) 500 MG capsule Take 1 capsule (500 mg total) by mouth 4 (four) times daily. 05/19/15   Carlisle Cater, PA-C  ibuprofen (ADVIL,MOTRIN) 200 MG tablet Take 200 mg by mouth every 6 (six) hours as needed for mild pain.     Historical Provider, MD    Family History Family History  Problem Relation Age of Onset  . Diabetes Mellitus II Mother   . Asthma Father     Social History Social History  Substance Use Topics  . Smoking status: Current Every Day Smoker    Packs/day: 0.33    Types: Cigars  . Smokeless  tobacco: Never Used     Comment: quit cigarette smoking 4 yrs ago, currently smokes 2 cigars daily  . Alcohol use 1.0 oz/week    2 Standard drinks or equivalent per week     Comment: drinks 8 ozs liquor daily, 16 ozs. plus on weekends or when not working, denies withdrawal symptoms-no alcohol consumed for 1 week recently w/o difficulties     Allergies   Review of patient's allergies indicates no known allergies.   Review of Systems Review of Systems  Constitutional: Negative for chills and fever.  Gastrointestinal: Negative for nausea and vomiting.  Skin:       +area of pain and swelling to the posterior scalp -drainage from the area      Physical Exam Updated Vital Signs BP 106/70 (BP Location: Right Arm)   Pulse 88   Temp 98.8 F (37.1 C) (Oral)   Resp 16   SpO2 97%   Physical Exam  Constitutional: He appears well-developed and well-nourished.  HENT:  Head: Normocephalic.  4 cm diameter abscess of the right posterior scalp with some overlying erythema. No active drainage.  No surrounding erythema or warmth  Eyes: Conjunctivae are normal.  Cardiovascular: Normal rate.   Pulmonary/Chest: Effort normal. No respiratory distress.  Abdominal: He exhibits no distension.  Musculoskeletal: Normal range of motion.  Neurological: He is alert.  Skin:  Skin is warm and dry.  Psychiatric: He has a normal mood and affect. His behavior is normal.  Nursing note and vitals reviewed.   ED Treatments / Results  Labs (all labs ordered are listed, but only abnormal results are displayed) Labs Reviewed - No data to display  EKG  EKG Interpretation None       Radiology No results found.  Procedures .Marland KitchenIncision and Drainage Date/Time: 08/21/2015 7:46 PM Performed by: Johnny Mooney Authorized by: Johnny Mooney   Consent:    Consent obtained:  Verbal   Consent given by:  Patient   Risks discussed:  Bleeding, incomplete drainage, infection and pain   Alternatives  discussed:  No treatment Universal protocol:    Procedure explained and questions answered to patient or proxy's satisfaction: yes     Relevant documents present and verified: yes     Immediately prior to procedure a time out was called: yes     Patient identity confirmed:  Verbally with patient, arm band and hospital-assigned identification number Location:    Type:  Abscess   Size:  4 cm diameter   Location:  Head   Head location:  Scalp Pre-procedure details:    Skin preparation:  Betadine Anesthesia (see MAR for exact dosages):    Anesthesia method:  Local infiltration   Local anesthetic:  Lidocaine 2% w/o epi Procedure type:    Complexity:  Complex Procedure details:    Needle aspiration: no     Incision types:  Single straight   Incision depth:  Dermal   Scalpel blade:  11   Wound management:  Probed and deloculated   Drainage:  Purulent and bloody   Drainage amount:  Copious   Wound treatment:  Wound left open   Packing materials:  None Post-procedure details:    Patient tolerance of procedure:  Tolerated well, no immediate complications   (including critical care time)  DIAGNOSTIC STUDIES: Oxygen Saturation is 97% on RA, normal by my interpretation.    COORDINATION OF CARE: 7:31 PM Discussed treatment plan with pt at bedside which includes I&D and pt agreed to plan.    Medications Ordered in ED Medications  lidocaine (XYLOCAINE) 2 % (with pres) injection 0-400 mg (not administered)     Initial Impression / Assessment and Plan / ED Course  I have reviewed the triage vital signs and the nursing notes.  Pertinent labs & imaging results that were available during my care of the patient were reviewed by me and considered in my medical decision making (see chart for details).  Clinical Course    Patient with skin abscess to the scalp. Incision and drainage performed in the ED today.  He is afebrile without systemic symptoms.  Abscess was not large enough to  warrant packing or drain placement. Wound recheck in 2 days. Supportive care and return precautions discussed.  Pt sent home with Bactrim. The patient appears reasonably screened and/or stabilized for discharge and I doubt any other emergent medical condition requiring further screening, evaluation, or treatment in the ED prior to discharge.    Final Clinical Impressions(s) / ED Diagnoses   Final diagnoses:  None    New Prescriptions New Prescriptions   No medications on file    I personally performed the services described in this documentation, which was scribed in my presence. The recorded information has been reviewed and is accurate.     Johnny Bible, PA-C 09/11/15 [place], MD 09/16/15 [phone]

## 2015-08-21 NOTE — ED Provider Notes (Signed)
Patient seen and evaluated. Discussed with Burgess Estelle PA-C. Exam shows fluctuant abscess approximately the size of a golf ball on the posterior midline occiput. Thinning of the skin with near spontaneous rupture. Agree with local anesthesia incision and drainage, small wick, antibiotics.   Tanna Furry, MD 08/21/15 [phone]

## 2015-08-21 NOTE — ED Triage Notes (Signed)
Pt with large abscess on back of head.  Started yesterday.

## 2015-08-24 ENCOUNTER — Encounter (HOSPITAL_COMMUNITY): Payer: Self-pay | Admitting: Emergency Medicine

## 2015-08-24 ENCOUNTER — Emergency Department (HOSPITAL_COMMUNITY)
Admission: EM | Admit: 2015-08-24 | Discharge: 2015-08-24 | Disposition: A | Discharge status: Home / Self Care | Payer: Self-pay | Attending: Emergency Medicine | Admitting: Emergency Medicine

## 2015-08-24 DIAGNOSIS — F17210 Nicotine dependence, cigarettes, uncomplicated: Secondary | ICD-10-CM | POA: Insufficient documentation

## 2015-08-24 DIAGNOSIS — B432 Subcutaneous pheomycotic abscess and cyst: Secondary | ICD-10-CM | POA: Insufficient documentation

## 2015-08-24 DIAGNOSIS — Z5189 Encounter for other specified aftercare: Secondary | ICD-10-CM

## 2015-08-24 DIAGNOSIS — L723 Sebaceous cyst: Secondary | ICD-10-CM

## 2015-08-24 NOTE — ED Triage Notes (Signed)
Pt had abscess lanced on back of head on Saturday. Was told to return for follow up today. No redness noted around site. Pt denies any noticeable complications so far.

## 2015-08-24 NOTE — ED Provider Notes (Signed)
[facility] DEPT Provider Note  By signing my name below, I, Mesha Guinyard, attest that this documentation has been prepared under the direction and in the presence of Treatment Team:  Attending Provider: Milton Ferguson, MD Physician Assistant: Zacarias Pontes, PA-C.  Electronically Signed: Verlee Monte, Medical Scribe. 08/24/15. 6:21 PM.  CSN: [id number] Arrival date & time: 08/24/15  1657  First Provider Contact:  None    History   Chief Complaint Chief Complaint  Patient presents with  . Abscess  . Follow-up    HPI Comments: Johnny Mooney is a 45 y.o. male who presents to the Emergency Department for abscess follow-up from his initial ED visit 3 days ago. He had it I&D'd on 08/21/15 and sent home with bactrim. Pt hasn't taken the bactrim yet, but will start today. Pt reports that his pain is improved, and the area is improving. States it's still having a bloody and occasionally white purulent drainage from the site. He reports that he's never had an abscess before, and doesn't remember having a nodule/lump in this area before, but recently he found another nodule/lump on the back of his head on the L side. He denies redness and warmth from the sites, ongoing pain, fevers, chills, CP, SOB, abd pain, N/V/D/C, hematuria, dysuria, myalgias, arthralgias, numbness, tingling, weakness, or any other symptoms. Pt shaves his head.  The history is provided by the patient and medical records. No language interpreter was used.  Abscess  Location:  Head/neck Head/neck abscess location:  Head Abscess quality: draining and painful   Abscess quality: no redness and no warmth   Red streaking: no   Duration:  3 days Progression:  Improving Pain details:    Quality:  No pain   Severity:  No pain   Duration:  3 days   Progression:  Improving Chronicity:  New Context: skin injury   Relieved by:  Draining/squeezing Worsened by:  Nothing Ineffective treatments:  None  tried Associated symptoms: no fever, no nausea and no vomiting     Past Medical History:  Diagnosis Date  . Cellulitis     Patient Active Problem List   Diagnosis Date Noted  . Cellulitis 02/20/2012  . Tobacco abuse 02/20/2012    Past Surgical History:  Procedure Laterality Date  . trauma surgery         Home Medications    Prior to Admission medications   Medication Sig Start Date End Date Taking? Authorizing Provider  cephALEXin (KEFLEX) 500 MG capsule Take 1 capsule (500 mg total) by mouth 4 (four) times daily. 05/19/15   Carlisle Cater, PA-C  ibuprofen (ADVIL,MOTRIN) 200 MG tablet Take 200 mg by mouth every 6 (six) hours as needed for mild pain.     Historical Provider, MD  sulfamethoxazole-trimethoprim (BACTRIM DS,SEPTRA DS) 800-160 MG tablet Take 2 tablets by mouth 2 (two) times daily. 08/21/15 08/31/15  Hyman Bible, PA-C    Family History Family History  Problem Relation Age of Onset  . Diabetes Mellitus II Mother   . Asthma Father     Social History Social History  Substance Use Topics  . Smoking status: Current Every Day Smoker    Packs/day: 0.33    Types: Cigars  . Smokeless tobacco: Never Used     Comment: quit cigarette smoking 4 yrs ago, currently smokes 2 cigars daily  . Alcohol use 1.0 oz/week    2 Standard drinks or equivalent per week     Comment: drinks 8 ozs liquor daily, 16 ozs. plus on  weekends or when not working, denies withdrawal symptoms-no alcohol consumed for 1 week recently w/o difficulties     Allergies   Review of patient's allergies indicates no known allergies.   Review of Systems Review of Systems  Constitutional: Negative for chills and fever.  Respiratory: Negative for shortness of breath.   Cardiovascular: Negative for chest pain.  Gastrointestinal: Negative for abdominal pain, constipation, diarrhea, nausea and vomiting.  Genitourinary: Negative for difficulty urinating, dysuria and hematuria.  Musculoskeletal:  Negative for arthralgias and myalgias.  Skin: Positive for wound (abscess I&D to posterior scalp). Negative for rash.  Allergic/Immunologic: Negative for immunocompromised state.  Neurological: Negative for weakness and numbness.  Psychiatric/Behavioral: Negative for confusion.  A complete 10 system review of systems was obtained and all systems are negative except as noted in the HPI and PMH.   Physical Exam Updated Vital Signs BP 103/70 (BP Location: Right Arm)   Pulse 85   Temp 98.5 F (36.9 C) (Oral)   Resp 14   SpO2 98%   Physical Exam  Constitutional: He is oriented to person, place, and time. Vital signs are normal. He appears well-developed and well-nourished.  Non-toxic appearance. No distress.  Afebrile, nontoxic, NAD  HENT:  Head: Normocephalic and atraumatic.  Mouth/Throat: Mucous membranes are normal.  Eyes: Conjunctivae and EOM are normal. Right eye exhibits no discharge. Left eye exhibits no discharge.  Neck: Normal range of motion. Neck supple.  Cardiovascular: Normal rate.   Pulmonary/Chest: Effort normal. No respiratory distress.  Abdominal: Normal appearance. He exhibits no distension.  Musculoskeletal: Normal range of motion.  Neurological: He is alert and oriented to person, place, and time. He has normal strength. No sensory deficit.  Skin: Skin is warm and dry. No rash noted.  Posterior right scalp with 3 cm ?cyst/lipoma vs abscess with a central incision mark, some mild serous fluid able to be expressed, no purulent drainage noted with probing using needle, no induration or warmth, no redness. Area seems fluctuant to a degree but seems more consistent with sebaceous cyst than an actual fluctuant abscess, no tenderness to the area. There is a separate 1 cm sebaceous cyst to the posterior left scalp which is nonTTP with no fluctuance or induration. No areas of surrounding cellulitis  Psychiatric: He has a normal mood and affect.  Nursing note and vitals  reviewed.   ED Treatments / Results  Labs (all labs ordered are listed, but only abnormal results are displayed) Labs Reviewed - No data to display  DIAGNOSTIC STUDIES: Oxygen Saturation is 98% on RA, nl by my interpretation.    COORDINATION OF CARE: 6:22 PM Discussed treatment plan with pt at bedside and pt agreed to plan.   Radiology No results found.  Procedures Procedures (including critical care time)  Medications Ordered in ED Medications - No data to display   Initial Impression / Assessment and Plan / ED Course  I have reviewed the triage vital signs and the nursing notes.  Pertinent labs & imaging results that were available during my care of the patient were reviewed by me and considered in my medical decision making (see chart for details).  Clinical Course    45 y.o. male here for recheck after abscess I&D 3 days ago. Area still draining a serous drainage, feels sort of fluctuant but isn't actually painful, and it seems most consistent with sebaceous cyst or lipoma instead of the fluctuance of an abscess. Probed area with a needle and no purulent drainage was expressed. No warmth/redness  or surrounding cellulitis. There is another area on his scalp that has a sebaceous cyst, so this raises my suspicion that this is just a sebaceous cyst or lipoma. Discussed starting the abx just in case, and f/up with [facility] in 1wk to recheck and to establish care. If the area persists, may need to have cyst removal at a later date. Discussed warm compresses, tylenol/motrin for pain. I explained the diagnosis and have given explicit precautions to return to the ER including for any other new or worsening symptoms. The patient understands and accepts the medical plan as it's been dictated and I have answered their questions. Discharge instructions concerning home care and prescriptions have been given. The patient is STABLE and is discharged to home in good condition.   Final Clinical  Impressions(s) / ED Diagnoses   Final diagnoses:  Wound check, abscess  Sebaceous cyst    New Prescriptions New Prescriptions   No medications on file    I personally performed the services described in this documentation, which was scribed in my presence. The recorded information has been reviewed and is accurate.    [id number] [place], PA-C 08/24/15 1843    Milton Ferguson, MD 08/24/15 [phone]

## 2015-08-24 NOTE — Discharge Instructions (Signed)
Keep wound clean and dry. Apply warm compresses to affected area throughout the day, at least 3-4 times daily. Take antibiotic until it is finished. Take tylenol and motrin as needed for pain. Followup with [facility] in 1 week to establish care and for wound recheck. There is a chance this is just a sebaceous cyst, if the area continues to be present after the infection is gone then it may need to be fully removed by your regular doctor or a dermatologist, which can be discussed at your follow up appointment in 1 week. Monitor area for signs of infection to include, but not limited to: increasing pain, spreading redness, drainage/pus, worsening swelling, or fevers. Return to emergency department for emergent changing or worsening symptoms.

## 2015-10-05 ENCOUNTER — Encounter (HOSPITAL_COMMUNITY): Payer: Self-pay | Admitting: *Deleted

## 2015-10-05 ENCOUNTER — Emergency Department (HOSPITAL_COMMUNITY)
Admission: EM | Admit: 2015-10-05 | Discharge: 2015-10-06 | Disposition: A | Discharge status: Home / Self Care | Payer: Self-pay | Attending: Emergency Medicine | Admitting: Emergency Medicine

## 2015-10-05 DIAGNOSIS — L02811 Cutaneous abscess of head [any part, except face]: Secondary | ICD-10-CM | POA: Insufficient documentation

## 2015-10-05 DIAGNOSIS — L03116 Cellulitis of left lower limb: Secondary | ICD-10-CM | POA: Insufficient documentation

## 2015-10-05 DIAGNOSIS — F17210 Nicotine dependence, cigarettes, uncomplicated: Secondary | ICD-10-CM | POA: Insufficient documentation

## 2015-10-05 DIAGNOSIS — Z792 Long term (current) use of antibiotics: Secondary | ICD-10-CM | POA: Insufficient documentation

## 2015-10-05 MED ORDER — LIDOCAINE-EPINEPHRINE (PF) 2 %-1:200000 IJ SOLN
20.0000 mL | Freq: Once | INTRAMUSCULAR | Status: AC
  Administered 2015-10-06: 20 mL
  Filled 2015-10-05: qty 20

## 2015-10-05 MED ORDER — CEFAZOLIN IN D5W 1 GM/50ML IV SOLN
1.0000 g | Freq: Once | INTRAVENOUS | Status: AC
  Administered 2015-10-06: 1 g via INTRAVENOUS
  Filled 2015-10-05: qty 50

## 2015-10-05 MED ORDER — SODIUM CHLORIDE 0.9 % IV BOLUS (SEPSIS)
1000.0000 mL | Freq: Once | INTRAVENOUS | Status: AC
  Administered 2015-10-06: 1000 mL via INTRAVENOUS

## 2015-10-05 NOTE — ED Triage Notes (Signed)
Pt complains of left lower leg cellulitis since Saturday and abscess to right posterior scalp for the past month. Pt has hx of cellulitis to left lower leg. Pt has had abscess to scalp drained but states it does not go away.

## 2015-10-05 NOTE — ED Provider Notes (Signed)
[facility] DEPT Provider Note   CSN: [id number] Arrival date & time: 10/05/15  1751     History   Chief Complaint Chief Complaint  Patient presents with  . Cellulitis  . Abscess    HPI Johnny Mooney is a 45 y.o. male.  HPI   Patient is a 45 year old male with a history of cellulitis, abscess, tobacco abuse who presents the emergency department with abscess on the back of his head and cellulitis to his left lower extremity. He's had both of these before. He noticed pain to his left lower extremity 3 days ago and yesterday noticed increased warmth, redness and increased pain. He states it feels similar to his last episode of cellulitis and he believes it happens when he is dehydrated. Pain is 7/10, worse with walking or touch. He hasen't taking anything for the pain. Patient denies fever, chills, abdominal pain, nausea, vomiting, numbness/timing, weakness.  Past Medical History:  Diagnosis Date  . Cellulitis     Patient Active Problem List   Diagnosis Date Noted  . Cellulitis 02/20/2012  . Tobacco abuse 02/20/2012    Past Surgical History:  Procedure Laterality Date  . trauma surgery         Home Medications    Prior to Admission medications   Medication Sig Start Date End Date Taking? Authorizing Provider  cephALEXin (KEFLEX) 500 MG capsule Take 1 capsule (500 mg total) by mouth 4 (four) times daily. 05/19/15   Carlisle Cater, PA-C  doxycycline (VIBRAMYCIN) 100 MG capsule Take 1 capsule (100 mg total) by mouth 2 (two) times daily. 10/06/15 10/13/15  Kalman Drape, PA  ibuprofen (ADVIL,MOTRIN) 200 MG tablet Take 200 mg by mouth every 6 (six) hours as needed for mild pain.     Historical Provider, MD    Family History Family History  Problem Relation Age of Onset  . Diabetes Mellitus II Mother   . Asthma Father     Social History Social History  Substance Use Topics  . Smoking status: Current Every Day Smoker    Packs/day: 0.33    Types: Cigars  .  Smokeless tobacco: Never Used     Comment: quit cigarette smoking 4 yrs ago, currently smokes 2 cigars daily  . Alcohol use 1.0 oz/week    2 Standard drinks or equivalent per week     Comment: drinks 8 ozs liquor daily, 16 ozs. plus on weekends or when not working, denies withdrawal symptoms-no alcohol consumed for 1 week recently w/o difficulties     Allergies   Review of patient's allergies indicates no known allergies.   Review of Systems Review of Systems  Constitutional: Negative for chills and fever.  Respiratory: Negative for shortness of breath.   Cardiovascular: Negative for chest pain.  Gastrointestinal: Negative for diarrhea, nausea and vomiting.  Musculoskeletal: Positive for arthralgias. Negative for back pain and neck pain.  Skin: Positive for color change and wound.     Physical Exam Updated Vital Signs BP (!) 93/53 (BP Location: Right Arm)   Pulse 88   Temp 98.2 F (36.8 C) (Oral)   Resp 19   SpO2 95%   Physical Exam  Constitutional: He appears well-developed and well-nourished. No distress.  HENT:  Head: Normocephalic and atraumatic.  Large area of fluctuation noted to posterior right scalp, roughly 4 cm diameter with purulent and bloody discharge, no TTP, no induration, mild signs of surrounding cellulitis.  Eyes: Conjunctivae are normal.  Pulmonary/Chest: Effort normal. No respiratory distress.  Musculoskeletal: Normal  range of motion.  Neurological: He is alert. Coordination normal.  Skin: Skin is warm and dry. He is not diaphoretic.  Pt with erythema, mild edema, no lymphangitis, increased warmth noted to left lower extremity, consistent with cellulitis, from the ankle to roughly mid shin, is not circumferential at this point, no red streaking, no area of fluctuance or drainable abscess, mildly TTP, pt is neurovascularly intact distally with 2+ DP pulses bilaterally, sensation intact  Bilateral feet with scaling without signs of surrounding infection.    Psychiatric: He has a normal mood and affect. His behavior is normal.  Nursing note and vitals reviewed.    ED Treatments / Results  Labs (all labs ordered are listed, but only abnormal results are displayed) Labs Reviewed  CBC WITH DIFFERENTIAL/PLATELET - Abnormal; Notable for the following:       Result Value   HCT 38.6 (*)    All other components within normal limits  BASIC METABOLIC PANEL - Abnormal; Notable for the following:    Glucose, Bld 101 (*)    Calcium 8.8 (*)    All other components within normal limits    EKG  EKG Interpretation None       Radiology No results found.  Procedures .Marland KitchenIncision and Drainage Date/Time: 10/06/2015 1:31 AM Performed by: Claris Gower, Brittay Mogle L Authorized by: Jackson Latino L   Consent:    Consent obtained:  Verbal   Consent given by:  Patient   Risks discussed:  Bleeding, incomplete drainage, infection and pain Universal protocol:    Procedure explained and questions answered to patient or proxy's satisfaction: yes     Patient identity confirmed:  Verbally with patient and arm band Location:    Type:  Abscess   Size:  4cm   Location:  Head   Head location:  Scalp Pre-procedure details:    Skin preparation:  Betadine Anesthesia (see MAR for exact dosages):    Anesthesia method:  Local infiltration   Local anesthetic:  Lidocaine 2% WITH epi Procedure type:    Complexity:  Simple Procedure details:    Incision types:  Single straight   Incision depth:  Subcutaneous   Scalpel blade:  10   Wound management:  Probed and deloculated and irrigated with saline   Drainage:  Bloody and purulent   Drainage amount:  Moderate   Packing materials:  1/4 in gauze Post-procedure details:    Patient tolerance of procedure:  Tolerated well, no immediate complications   (including critical care time)  Medications Ordered in ED Medications  lidocaine-EPINEPHrine (XYLOCAINE W/EPI) 2 %-1:200000 (PF) injection 20 mL (20 mLs Infiltration  Given 10/06/15 0027)  ceFAZolin (ANCEF) IVPB 1 g/50 mL premix (0 g Intravenous Stopped 10/06/15 0104)  sodium chloride 0.9 % bolus 1,000 mL (0 mLs Intravenous Stopped 10/06/15 0104)  diphenhydrAMINE (BENADRYL) injection 25 mg (25 mg Intravenous Given 10/06/15 0122)     Initial Impression / Assessment and Plan / ED Course  I have reviewed the triage vital signs and the nursing notes.  Pertinent labs & imaging results that were available during my care of the patient were reviewed by me and considered in my medical decision making (see chart for details).  Clinical Course    Patient with skin abscess amenable to incision and drainage.  Abscess large enough to warrant packing,  wound recheck in 2 days. Mild signs of cellulitis is surrounding skin. Likely an infected sebaceous cyst.  Patient presentation consistent with cellulitis. Afebrile. No tachycardia, No leukocytosis, or other symptoms suggestive  of severe infection or compartment syndrome. pt advised to follow up for wound check in 2 days, sooner for worsening systemic symptoms, new lymphangitis, significant spread of erythema, signs of compartment syndrome. Patient given 1 g of Ancef while in the ED and a liter fluids.  Will discharge with Doxy.   Pt with one reading of hypotension in the ED. Pt given a liter of fluids. BP was 106/76 upon arrival to the ED and 99/68 prior to discharge.   Return precautions discussed. Pt appears safe for discharge and expressed understanding to the discharge instructions.   Pt case discussed with Dr. Zenia Resides who agrees with the above plan.   Final Clinical Impressions(s) / ED Diagnoses   Final diagnoses:  Cellulitis of left lower extremity  Abscess of head    New Prescriptions Discharge Medication List as of 10/06/2015  1:09 AM    START taking these medications   Details  doxycycline (VIBRAMYCIN) 100 MG capsule Take 1 capsule (100 mg total) by mouth 2 (two) times daily., Starting Tue 10/06/2015, Until  Tue 10/13/2015, [place], [place] 10/06/15 [id number]    Lacretia Leigh, MD 10/10/15 [phone]

## 2015-10-05 NOTE — ED Notes (Signed)
Pt called from triage with no answer 

## 2015-10-05 NOTE — ED Triage Notes (Signed)
Pt called no answer 

## 2015-10-06 LAB — CBC WITH DIFFERENTIAL/PLATELET
BASOS PCT: 0 %
Basophils Absolute: 0 10*3/uL (ref 0.0–0.1)
EOS ABS: 0.2 10*3/uL (ref 0.0–0.7)
Eosinophils Relative: 2 %
HCT: 38.6 % — ABNORMAL LOW (ref 39.0–52.0)
HEMOGLOBIN: 13.7 g/dL (ref 13.0–17.0)
Lymphocytes Relative: 33 %
Lymphs Abs: 3.1 10*3/uL (ref 0.7–4.0)
MCH: 29.4 pg (ref 26.0–34.0)
MCHC: 35.5 g/dL (ref 30.0–36.0)
MCV: 82.8 fL (ref 78.0–100.0)
Monocytes Absolute: 0.9 10*3/uL (ref 0.1–1.0)
Monocytes Relative: 10 %
NEUTROS PCT: 55 %
Neutro Abs: 5.4 10*3/uL (ref 1.7–7.7)
Platelets: 236 10*3/uL (ref 150–400)
RBC: 4.66 MIL/uL (ref 4.22–5.81)
RDW: 14.8 % (ref 11.5–15.5)
WBC: 9.6 10*3/uL (ref 4.0–10.5)

## 2015-10-06 LAB — BASIC METABOLIC PANEL
Anion gap: 8 (ref 5–15)
BUN: 11 mg/dL (ref 6–20)
CHLORIDE: 105 mmol/L (ref 101–111)
CO2: 27 mmol/L (ref 22–32)
CREATININE: 0.9 mg/dL (ref 0.61–1.24)
Calcium: 8.8 mg/dL — ABNORMAL LOW (ref 8.9–10.3)
Glucose, Bld: 101 mg/dL — ABNORMAL HIGH (ref 65–99)
Potassium: 4.2 mmol/L (ref 3.5–5.1)
SODIUM: 140 mmol/L (ref 135–145)

## 2015-10-06 MED ORDER — DIPHENHYDRAMINE HCL 50 MG/ML IJ SOLN
25.0000 mg | Freq: Once | INTRAMUSCULAR | Status: AC
  Administered 2015-10-06: 25 mg via INTRAVENOUS
  Filled 2015-10-06: qty 1

## 2015-10-06 MED ORDER — DOXYCYCLINE HYCLATE 100 MG PO CAPS: 100.0000 mg | ORAL_CAPSULE | Freq: Two times a day (BID) | ORAL | 0 refills | Status: AC

## 2015-10-06 NOTE — Discharge Instructions (Signed)
Take the doxycycline as prescribed and be sure to complete the entire course. This antibiotic will make you sensitive to the sunlight to be sure to cover your skin while outside. Follow-up here at the emergency department in 2 days to have your leg and abscess rechecked. Follow-up with Dr. Windle Guard, general surgery, to discuss having the cyst on your head removed.  Return to the emergency department if you experience fevers, worsening swelling of your leg, worsening pain in your leg, decreased sensation in your foot, increased redness of your leg or red streaks, or any other concerning symptoms.

## 2015-10-07 ENCOUNTER — Encounter (HOSPITAL_COMMUNITY): Payer: Self-pay

## 2015-10-07 ENCOUNTER — Emergency Department (HOSPITAL_COMMUNITY)
Admission: EM | Admit: 2015-10-07 | Discharge: 2015-10-07 | Disposition: A | Discharge status: Home / Self Care | Payer: Self-pay | Attending: Emergency Medicine | Admitting: Emergency Medicine

## 2015-10-07 DIAGNOSIS — Z09 Encounter for follow-up examination after completed treatment for conditions other than malignant neoplasm: Secondary | ICD-10-CM

## 2015-10-07 DIAGNOSIS — Z79899 Other long term (current) drug therapy: Secondary | ICD-10-CM | POA: Insufficient documentation

## 2015-10-07 DIAGNOSIS — F17210 Nicotine dependence, cigarettes, uncomplicated: Secondary | ICD-10-CM | POA: Insufficient documentation

## 2015-10-07 DIAGNOSIS — Z4801 Encounter for change or removal of surgical wound dressing: Secondary | ICD-10-CM | POA: Insufficient documentation

## 2015-10-07 NOTE — ED Provider Notes (Signed)
[facility] DEPT Provider Note   CSN: [id number] Arrival date & time: 10/07/15  1832     History   Chief Complaint Chief Complaint  Patient presents with  . Wound Check    HPI Johnny Mooney is a 45 y.o. male.  HPI   45 year old male with history of recurrent abscess and cellulitis presenting for a wound recheck. Patient was seen in the ED 2 days ago for an abscess on the back of his head as well as cellulitis to his left lower extremity. He was found to have an abscess approximately 4 cm diameter to his posterior right scalp which was incised and drained in the ER with packing placed. Patient also was discharged with doxycycline, and recommendation to return in 48 hours for wound recheck.  Pt sts the pain has improved.  He denies fever, or chills.  No other complaints.  He has been taking his antibiotic  Past Medical History:  Diagnosis Date  . Cellulitis     Patient Active Problem List   Diagnosis Date Noted  . Cellulitis 02/20/2012  . Tobacco abuse 02/20/2012    Past Surgical History:  Procedure Laterality Date  . trauma surgery         Home Medications    Prior to Admission medications   Medication Sig Start Date End Date Taking? Authorizing Provider  cephALEXin (KEFLEX) 500 MG capsule Take 1 capsule (500 mg total) by mouth 4 (four) times daily. 05/19/15   Carlisle Cater, PA-C  doxycycline (VIBRAMYCIN) 100 MG capsule Take 1 capsule (100 mg total) by mouth 2 (two) times daily. 10/06/15 10/13/15  Kalman Drape, PA  ibuprofen (ADVIL,MOTRIN) 200 MG tablet Take 200 mg by mouth every 6 (six) hours as needed for mild pain.     Historical Provider, MD    Family History Family History  Problem Relation Age of Onset  . Diabetes Mellitus II Mother   . Asthma Father     Social History Social History  Substance Use Topics  . Smoking status: Current Every Day Smoker    Packs/day: 0.33    Types: Cigars  . Smokeless tobacco: Never Used     Comment: quit  cigarette smoking 4 yrs ago, currently smokes 2 cigars daily  . Alcohol use 1.0 oz/week    2 Standard drinks or equivalent per week     Comment: drinks 8 ozs liquor daily, 16 ozs. plus on weekends or when not working, denies withdrawal symptoms-no alcohol consumed for 1 week recently w/o difficulties     Allergies   Review of patient's allergies indicates no known allergies.   Review of Systems Review of Systems  Constitutional: Negative for fever.  Skin: Negative for rash.  Neurological: Negative for headaches.     Physical Exam Updated Vital Signs BP 97/68 (BP Location: Right Arm)   Pulse 96   Temp 98.6 F (37 C) (Oral)   Resp 16   SpO2 99%   Physical Exam  Constitutional: He appears well-developed and well-nourished. No distress.  HENT:  Head: Atraumatic.  An area of induration noted to the R posterior scalp with packing in place, mildly tender to palpation but no surrounding skin erythema.    Eyes: Conjunctivae are normal.  Neck: Neck supple.  Neurological: He is alert.  Skin: No rash noted.  Psychiatric: He has a normal mood and affect.  Nursing note and vitals reviewed.    ED Treatments / Results  Labs (all labs ordered are listed, but only abnormal results  are displayed) Labs Reviewed - No data to display  EKG  EKG Interpretation None       Radiology No results found.  Procedures Procedures (including critical care time)   Medications Ordered in ED Medications - No data to display   Initial Impression / Assessment and Plan / ED Course  I have reviewed the triage vital signs and the nursing notes.  Pertinent labs & imaging results that were available during my care of the patient were reviewed by me and considered in my medical decision making (see chart for details).  Clinical Course    BP 97/68 (BP Location: Right Arm)   Pulse 96   Temp 98.6 F (37 C) (Oral)   Resp 16   SpO2 99%    Final Clinical Impressions(s) / ED Diagnoses    Final diagnoses:  Encounter for recheck of abscess following incision and drainage    New Prescriptions New Prescriptions   No medications on file   7:16 PM Wound reassess on R posterior scalp.  Packing removed.  Dressing placed.  Encourage continue with abx and return if worsen.  Otherwise, well healing abscess after I&D.   Domenic Moras, PA-C 10/07/15 1916    Duffy Bruce, MD 10/08/15 [phone]

## 2015-10-07 NOTE — ED Notes (Signed)
MD at bedside. [place]

## 2015-10-07 NOTE — Discharge Instructions (Signed)
Continue taking your antibiotic for the full duration as previously prescribed.  Apply warm compress to your scalp several times daily to help encourage healing.  Return if your condition worsen or if you have other concerns.  Take tylenol and Ibuprofen as needed for pain.

## 2015-10-07 NOTE — ED Triage Notes (Signed)
Pt here to have wound evaluation of scalp abscess that was drained and packed at prior visit. Pt A+OX4.

## 2015-11-03 ENCOUNTER — Emergency Department (HOSPITAL_COMMUNITY)
Admission: EM | Admit: 2015-11-03 | Discharge: 2015-11-03 | Disposition: A | Discharge status: Home / Self Care | Payer: Medicaid Other | Attending: Emergency Medicine | Admitting: Emergency Medicine

## 2015-11-03 ENCOUNTER — Encounter (HOSPITAL_COMMUNITY): Payer: Self-pay | Admitting: Emergency Medicine

## 2015-11-03 DIAGNOSIS — F17290 Nicotine dependence, other tobacco product, uncomplicated: Secondary | ICD-10-CM | POA: Insufficient documentation

## 2015-11-03 DIAGNOSIS — D234 Other benign neoplasm of skin of scalp and neck: Secondary | ICD-10-CM | POA: Diagnosis not present

## 2015-11-03 DIAGNOSIS — L02811 Cutaneous abscess of head [any part, except face]: Secondary | ICD-10-CM | POA: Diagnosis present

## 2015-11-03 MED ORDER — IBUPROFEN 800 MG PO TABS: 800.0000 mg | ORAL_TABLET | Freq: Three times a day (TID) | ORAL | 0 refills | Status: DC

## 2015-11-03 MED ORDER — IBUPROFEN 800 MG PO TABS
800.0000 mg | ORAL_TABLET | Freq: Once | ORAL | Status: AC
  Administered 2015-11-03: 800 mg via ORAL
  Filled 2015-11-03: qty 1

## 2015-11-03 NOTE — Discharge Instructions (Signed)
1. Medications: alternate ibuprofen and tylenol for pain control, usual home medications 2. Treatment: rest, drink plenty of fluids, gentle stretching 3. Follow Up: Please followup at your scheduled surgery in 24 hours; Please return to the ER for worsening symptoms or other concerns

## 2015-11-03 NOTE — ED Triage Notes (Addendum)
Pt states he recently had an abscess I&D a few weeks ago on the back of his head.  Returned tonight with increasing pain and swelling and states he has been outside since 10 this evening and couldn't make it through the door he was in so much pain.  Pt ambulatory in triage.  On assessment, large abscess noted to back of right side of head. Some light red draining at this time. Red flesh noted.

## 2015-11-03 NOTE — ED Notes (Signed)
Bed: WA06 Expected date:  Expected time:  Means of arrival:  Comments: 

## 2015-11-03 NOTE — ED Provider Notes (Signed)
[facility] DEPT Provider Note   CSN: [id number] Arrival date & time: 11/03/15  [id number]     History   Chief Complaint Chief Complaint  Patient presents with  . Abscess    HPI Johnny Mooney is a 45 y.o. male with no major medical problems presents to the Emergency Department complaining of gradual, persistent, progressively worsening mass to the right side of the scalp onset almost 5 months ago. Associated symptoms include pain at the site and drainage of clear fluid.  He reports multiple I&D's without improvement. Nothing makes the symptoms better. Palpation makes the symptoms worse. Patient denies fever, chills, nausea, vomiting. He reports increasing size of the mass over the last several weeks. He has been evaluated by central care on a surgery and has surgery scheduled for removal in 24 hours.   The history is provided by the patient and medical records. No language interpreter was used.    Past Medical History:  Diagnosis Date  . Abscess   . Cellulitis     Patient Active Problem List   Diagnosis Date Noted  . Cellulitis 02/20/2012  . Tobacco abuse 02/20/2012    Past Surgical History:  Procedure Laterality Date  . trauma surgery         Home Medications    Prior to Admission medications   Medication Sig Start Date End Date Taking? Authorizing Provider  ibuprofen (ADVIL,MOTRIN) 800 MG tablet Take 1 tablet (800 mg total) by mouth 3 (three) times daily. 11/03/15   Jarrett Soho Ellyn Rubiano, PA-C    Family History Family History  Problem Relation Age of Onset  . Diabetes Mellitus II Mother   . Asthma Father     Social History Social History  Substance Use Topics  . Smoking status: Current Every Day Smoker    Packs/day: 0.33    Types: Cigars  . Smokeless tobacco: Never Used     Comment: quit cigarette smoking 4 yrs ago, currently smokes 2 cigars daily  . Alcohol use 1.0 oz/week    2 Standard drinks or equivalent per week     Comment: couple of beers  on the weekends     Allergies   Review of patient's allergies indicates no known allergies.   Review of Systems Review of Systems  Skin:       Scalp mass   All other systems reviewed and are negative.    Physical Exam Updated Vital Signs BP 113/78 (BP Location: Left Arm)   Pulse 62   Temp 97.9 F (36.6 C) (Oral)   Resp 19   Ht 5\' 11"  (1.803 m)   Wt 78 kg   SpO2 100%   BMI 23.99 kg/m   Physical Exam  Constitutional: He is oriented to person, place, and time. He appears well-developed and well-nourished. No distress.  HENT:  Head: Normocephalic and atraumatic.  Eyes: Conjunctivae are normal. No scleral icterus.  Neck: Normal range of motion.  Cardiovascular: Normal rate, regular rhythm and intact distal pulses.   Pulmonary/Chest: Effort normal and breath sounds normal.  Abdominal: Soft. He exhibits no distension. There is no tenderness.  Lymphadenopathy:    He has no cervical adenopathy.  Neurological: He is alert and oriented to person, place, and time.  Skin: Skin is warm and dry. He is not diaphoretic. No erythema.  Large mass to the right side of the scalp. No erythema, induration or increased warmth. Mass is open and draining clear serous fluid.  Psychiatric: He has a normal mood and affect.  Nursing  note and vitals reviewed.    ED Treatments / Results  Labs (all labs ordered are listed, but only abnormal results are displayed) Labs Reviewed - No data to display  EKG  EKG Interpretation None       Radiology No results found.  Procedures Procedures (including critical care time)  Medications Ordered in ED Medications  ibuprofen (ADVIL,MOTRIN) tablet 800 mg (not administered)     Initial Impression / Assessment and Plan / ED Course  I have reviewed the triage vital signs and the nursing notes.  Pertinent labs & imaging results that were available during my care of the patient were reviewed by me and considered in my medical decision making  (see chart for details).  Clinical Course    EMERGENCY DEPARTMENT US SOFT TISSUE INTERPRETATION "Study: Limited Ultrasound of the noted body part in comments below"  INDICATIONS: Pain Multiple views of the body part are obtained with a multi-frequency linear probe  PERFORMED BY:  Myself  IMAGES ARCHIVED?: Yes  SIDE:Right   BODY PART:Other soft tisse (comment in note)  FINDINGS: No abcess noted  LIMITATIONS:  Body Habitus  INTERPRETATION:  No abcess noted and No cellulitis noted  COMMENT:  Cystic material within the area of swelling   Patient with large mass to the right scalpel. Cystic and solid material are noted within the mass on ultrasound. No definitive collection of fluid for which I&D would be recommended. No erythema, induration or increased warmth to the mass with surrounding tissue to suggest infection or cellulitis. No cobblestoning noted on ultrasound. Mass is open and draining clear serous fluid. No purulent drainage. Patient is afebrile with stable vital signs.  He is scheduled for removal of the cyst and 24 hours.  Final Clinical Impressions(s) / ED Diagnoses   Final diagnoses:  Dermoid cyst of scalp    New Prescriptions New Prescriptions   IBUPROFEN (ADVIL,MOTRIN) 800 MG TABLET    Take 1 tablet (800 mg total) by mouth 3 (three) times daily.     Jarrett Soho Catarina Huntley, PA-C [id number]    Delora Fuel, MD [id number]

## 2015-11-04 ENCOUNTER — Ambulatory Visit: Payer: Self-pay | Admitting: Surgery

## 2015-11-04 NOTE — H&P (Signed)
History of Present Illness Johnny Mooney. Ryonna Cimini MD; 11/04/2015 4:26 PM) The patient is a 45 year old male who presents with a complaint of Mass. Urgent office  Patient referred by [facility] emergency Department for a large cyst on his scalp.  This is a 45 year old male in good health who presents with several months of a recurrent mass on the posterior scalp. He has been to the emergency department several times and this was felt to represent an abscess. He has had this lanced several times but has never resolved. He continues to recur. Currently there is clear drainage coming from this area. He would like to have this definitively addressed to prevent any recurrence. He does not have a primary care physician.   Other Problems Elbert Ewings, CMA; 11/04/2015 2:29 PM) Back Pain  Past Surgical History Elbert Ewings, CMA; 11/04/2015 2:29 PM) Oral Surgery  Diagnostic Studies History Elbert Ewings, [place]; 11/04/2015 2:29 PM) Colonoscopy never  Allergies Elbert Ewings, CMA; 11/04/2015 2:30 PM) No Known Drug Allergies 11/04/2015  Medication History Elbert Ewings, CMA; 11/04/2015 2:30 PM) No Current Medications Medications Reconciled  Social History Elbert Ewings, CMA; 11/04/2015 2:29 PM) Alcohol use Moderate alcohol use. Caffeine use Carbonated beverages. Illicit drug use Uses socially only. Tobacco use Current some day smoker.  Family History Elbert Ewings, [place]; 11/04/2015 2:29 PM) Cerebrovascular Accident Family Members In General. Diabetes Mellitus Mother.     Review of Systems Elbert Ewings CMA; 11/04/2015 2:29 PM) General Not Present- Appetite Loss, Chills, Fatigue, Fever, Night Sweats, Weight Gain and Weight Loss. Skin Present- Change in Wart/Mole. Not Present- Dryness, Hives, Jaundice, New Lesions, Non-Healing Wounds, Rash and Ulcer. HEENT Not Present- Earache, Hearing Loss, Hoarseness, Nose Bleed, Oral Ulcers, Ringing in the Ears, Seasonal Allergies, Sinus Pain, Sore  Throat, Visual Disturbances, Wears glasses/contact lenses and Yellow Eyes. Respiratory Not Present- Bloody sputum, Chronic Cough, Difficulty Breathing, Snoring and Wheezing. Cardiovascular Not Present- Chest Pain, Difficulty Breathing Lying Down, Leg Cramps, Palpitations, Rapid Heart Rate, Shortness of Breath and Swelling of Extremities. Gastrointestinal Not Present- Abdominal Pain, Bloating, Bloody Stool, Change in Bowel Habits, Chronic diarrhea, Constipation, Difficulty Swallowing, Excessive gas, Gets full quickly at meals, Hemorrhoids, Indigestion, Nausea, Rectal Pain and Vomiting. Male Genitourinary Not Present- Blood in Urine, Change in Urinary Stream, Frequency, Impotence, Nocturia, Painful Urination, Urgency and Urine Leakage. Musculoskeletal Present- Swelling of Extremities. Not Present- Back Pain, Joint Pain, Joint Stiffness, Muscle Pain and Muscle Weakness. Neurological Present- Headaches, Numbness, Tingling and Weakness. Not Present- Decreased Memory, Fainting, Seizures, Tremor and Trouble walking. Psychiatric Not Present- Anxiety, Bipolar, Change in Sleep Pattern, Depression, Fearful and Frequent crying. Endocrine Present- Hair Changes. Not Present- Cold Intolerance, Excessive Hunger, Heat Intolerance, Hot flashes and New Diabetes. Hematology Not Present- Blood Thinners, Easy Bruising, Excessive bleeding, Gland problems, HIV and Persistent Infections.  Vitals Elbert Ewings CMA; 11/04/2015 2:30 PM) 11/04/2015 2:30 PM Weight: 171 lb Height: 70in Body Surface Area: 1.95 m Body Mass Index: 24.54 kg/m  Temp.: 10F(Temporal)  Pulse: 81 (Regular)  BP: 138/82 (Sitting, Left Arm, Standard)      Physical Exam Rodman Key K. Laresha Bacorn MD; 11/04/2015 4:27 PM)  The physical exam findings are as follows: Note:WDWN in NAD Scalp: right posterior scalp; soft well-demarcated 3 cm mass; 1 cm central ulceration with clear drainage; no erythema or induration Eyes: Pupils equal, round;  sclera anicteric HENT: Oral mucosa moist; good dentition Neck: No masses palpated, no thyromegaly Lungs: CTA bilaterally; normal respiratory effort CV: Regular rate and rhythm; no murmurs; extremities well-perfused with no edema  Abd: +bowel sounds, soft, non-tender, no palpable organomegaly; no palpable hernias Skin: Warm, dry; no sign of jaundice Psychiatric - alert and oriented x 4; calm mood and affect    Assessment & Plan Rodman Key K. Cayman Kielbasa MD; 11/04/2015 4:28 PM)  RUPTURED SEBACEOUS CYST (L72.0) Impression: 3 cm - posterior scalp  Current Plans Schedule for Surgery - Excision of subcutaneous mass - posterior scalp. The surgical procedure has been discussed with the patient. Potential risks, benefits, alternative treatments, and expected outcomes have been explained. All of the patient's questions at this time have been answered. The likelihood of reaching the patient's treatment goal is good. The patient understand the proposed surgical procedure and wishes to proceed. Note:I am unable to schedule this for the next several days so I asked my partner Dr. Lurena Joiner Kinsinger to take over the patient's care. He has agreed to do so and has talked to the patient in the office.  Johnny Mooney. Georgette Dover, MD, [facility]  General/ Trauma Surgery  11/04/2015 4:28 PM

## 2015-11-05 ENCOUNTER — Encounter (HOSPITAL_BASED_OUTPATIENT_CLINIC_OR_DEPARTMENT_OTHER): Payer: Self-pay | Admitting: *Deleted

## 2015-11-05 NOTE — Progress Notes (Signed)
NPO AFTER MN.  ARRIVE AT 0600.  NEEDS HG.  

## 2015-11-06 ENCOUNTER — Encounter (HOSPITAL_BASED_OUTPATIENT_CLINIC_OR_DEPARTMENT_OTHER): Payer: Self-pay | Admitting: *Deleted

## 2015-11-06 ENCOUNTER — Encounter (HOSPITAL_BASED_OUTPATIENT_CLINIC_OR_DEPARTMENT_OTHER)
Admission: RE | Disposition: A | Discharge status: Home / Self Care | Payer: Self-pay | Source: Ambulatory Visit | Attending: General Surgery

## 2015-11-06 ENCOUNTER — Ambulatory Visit (HOSPITAL_BASED_OUTPATIENT_CLINIC_OR_DEPARTMENT_OTHER): Payer: Medicaid Other | Admitting: Anesthesiology

## 2015-11-06 ENCOUNTER — Ambulatory Visit (HOSPITAL_BASED_OUTPATIENT_CLINIC_OR_DEPARTMENT_OTHER)
Admission: RE | Admit: 2015-11-06 | Discharge: 2015-11-06 | Disposition: A | Discharge status: Home / Self Care | Payer: Medicaid Other | Source: Ambulatory Visit | Attending: General Surgery | Admitting: General Surgery

## 2015-11-06 DIAGNOSIS — C4442 Squamous cell carcinoma of skin of scalp and neck: Secondary | ICD-10-CM | POA: Insufficient documentation

## 2015-11-06 DIAGNOSIS — R220 Localized swelling, mass and lump, head: Secondary | ICD-10-CM | POA: Diagnosis present

## 2015-11-06 DIAGNOSIS — F17200 Nicotine dependence, unspecified, uncomplicated: Secondary | ICD-10-CM | POA: Insufficient documentation

## 2015-11-06 HISTORY — DX: Cellulitis of left lower limb: L03.116

## 2015-11-06 HISTORY — PX: EXCISION MASS HEAD: SHX6702

## 2015-11-06 HISTORY — DX: Personal history of other (healed) physical injury and trauma: Z87.828

## 2015-11-06 HISTORY — DX: Localized swelling, mass and lump, head: R22.0

## 2015-11-06 LAB — POCT HEMOGLOBIN-HEMACUE: Hemoglobin: 14 g/dL (ref 13.0–17.0)

## 2015-11-06 SURGERY — EXCISION, MASS, HEAD
Anesthesia: General | Site: Scalp

## 2015-11-06 MED ORDER — FENTANYL CITRATE (PF) 100 MCG/2ML IJ SOLN
INTRAMUSCULAR | Status: DC | PRN
  Administered 2015-11-06 (×2): 50 ug via INTRAVENOUS

## 2015-11-06 MED ORDER — IBUPROFEN 800 MG PO TABS
800.0000 mg | ORAL_TABLET | Freq: Three times a day (TID) | ORAL | 0 refills | Status: DC | PRN
Start: 2015-11-06 — End: 2016-01-01

## 2015-11-06 MED ORDER — LACTATED RINGERS IV SOLN
INTRAVENOUS | Status: DC
  Administered 2015-11-06 (×2): via INTRAVENOUS
  Filled 2015-11-06: qty 1000

## 2015-11-06 MED ORDER — BACITRACIN-NEOMYCIN-POLYMYXIN OINTMENT TUBE
TOPICAL_OINTMENT | CUTANEOUS | Status: DC | PRN
  Administered 2015-11-06: 1 via TOPICAL

## 2015-11-06 MED ORDER — PROPOFOL 500 MG/50ML IV EMUL
INTRAVENOUS | Status: DC | PRN
  Administered 2015-11-06: 200 mL via INTRAVENOUS

## 2015-11-06 MED ORDER — HYDROCODONE-ACETAMINOPHEN 5-325 MG PO TABS: 1.0000 | ORAL_TABLET | Freq: Four times a day (QID) | ORAL | 0 refills | Status: DC | PRN

## 2015-11-06 MED ORDER — SUCCINYLCHOLINE CHLORIDE 20 MG/ML IJ SOLN
INTRAMUSCULAR | Status: AC
  Filled 2015-11-06: qty 1

## 2015-11-06 MED ORDER — MIDAZOLAM HCL 2 MG/2ML IJ SOLN
INTRAMUSCULAR | Status: AC
  Filled 2015-11-06: qty 2

## 2015-11-06 MED ORDER — ONDANSETRON HCL 4 MG/2ML IJ SOLN
INTRAMUSCULAR | Status: DC | PRN
  Administered 2015-11-06: 4 mg via INTRAVENOUS

## 2015-11-06 MED ORDER — IBUPROFEN 200 MG PO TABS
ORAL_TABLET | ORAL | Status: AC
  Filled 2015-11-06: qty 4

## 2015-11-06 MED ORDER — LIDOCAINE 2% (20 MG/ML) 5 ML SYRINGE
INTRAMUSCULAR | Status: AC
  Filled 2015-11-06: qty 5

## 2015-11-06 MED ORDER — CEFAZOLIN SODIUM-DEXTROSE 2-4 GM/100ML-% IV SOLN
2.0000 g | INTRAVENOUS | Status: AC
  Administered 2015-11-06: 2 g via INTRAVENOUS
  Filled 2015-11-06: qty 100

## 2015-11-06 MED ORDER — CHLORHEXIDINE GLUCONATE CLOTH 2 % EX PADS
6.0000 | MEDICATED_PAD | Freq: Once | CUTANEOUS | Status: DC
  Filled 2015-11-06: qty 6

## 2015-11-06 MED ORDER — WHITE PETROLATUM GEL
Status: AC
  Filled 2015-11-06: qty 5

## 2015-11-06 MED ORDER — BUPIVACAINE-EPINEPHRINE 0.5% -1:200000 IJ SOLN
INTRAMUSCULAR | Status: DC | PRN
  Administered 2015-11-06: 14 mL

## 2015-11-06 MED ORDER — MIDAZOLAM HCL 5 MG/5ML IJ SOLN
INTRAMUSCULAR | Status: DC | PRN
  Administered 2015-11-06: 2 mg via INTRAVENOUS

## 2015-11-06 MED ORDER — CEFAZOLIN SODIUM-DEXTROSE 2-4 GM/100ML-% IV SOLN
INTRAVENOUS | Status: AC
  Filled 2015-11-06: qty 100

## 2015-11-06 MED ORDER — IBUPROFEN 800 MG PO TABS
800.0000 mg | ORAL_TABLET | Freq: Once | ORAL | Status: AC
  Administered 2015-11-06: 800 mg via ORAL
  Filled 2015-11-06: qty 1

## 2015-11-06 MED ORDER — FENTANYL CITRATE (PF) 100 MCG/2ML IJ SOLN
INTRAMUSCULAR | Status: AC
  Filled 2015-11-06: qty 2

## 2015-11-06 MED ORDER — LIDOCAINE 2% (20 MG/ML) 5 ML SYRINGE
INTRAMUSCULAR | Status: DC | PRN
  Administered 2015-11-06: 100 mg via INTRAVENOUS

## 2015-11-06 SURGICAL SUPPLY — 50 items
BLADE HEX COATED 2.75 (ELECTRODE) ×3 IMPLANT
BLADE SURG 15 STRL LF DISP TIS (BLADE) ×1 IMPLANT
BLADE SURG 15 STRL SS (BLADE) ×3
BNDG GAUZE ELAST 4 BULKY (GAUZE/BANDAGES/DRESSINGS) IMPLANT
CANISTER SUCT 1200ML W/VALVE (MISCELLANEOUS) ×2 IMPLANT
CHLORAPREP W/TINT 26ML (MISCELLANEOUS) ×3 IMPLANT
CLEANER CAUTERY TIP 5X5 PAD (MISCELLANEOUS) IMPLANT
COVER BACK TABLE 60X90IN (DRAPES) ×3 IMPLANT
COVER MAYO STAND STRL (DRAPES) ×2 IMPLANT
DRAIN PENROSE 18X1/2 LTX STRL (DRAIN) IMPLANT
DRAIN PENROSE 18X1/4 LTX STRL (WOUND CARE) IMPLANT
DRAPE LAPAROTOMY 100X72 PEDS (DRAPES) ×3 IMPLANT
DRAPE UTILITY XL STRL (DRAPES) ×3 IMPLANT
ELECT REM PT RETURN 9FT ADLT (ELECTROSURGICAL) ×3
ELECTRODE REM PT RTRN 9FT ADLT (ELECTROSURGICAL) ×1 IMPLANT
GLOVE BIOGEL PI IND STRL 7.0 (GLOVE) ×1 IMPLANT
GLOVE BIOGEL PI IND STRL 7.5 (GLOVE) IMPLANT
GLOVE BIOGEL PI INDICATOR 7.0 (GLOVE) ×2
GLOVE BIOGEL PI INDICATOR 7.5 (GLOVE) ×2
GLOVE SURG SS PI 7.0 STRL IVOR (GLOVE) ×3 IMPLANT
GOWN STRL REUS W/ TWL LRG LVL3 (GOWN DISPOSABLE) ×1 IMPLANT
GOWN STRL REUS W/TWL LRG LVL3 (GOWN DISPOSABLE) ×6
KIT ROOM TURNOVER WOR (KITS) ×3 IMPLANT
NDL HYPO 25X1 1.5 SAFETY (NEEDLE) ×1 IMPLANT
NEEDLE HYPO 25X1 1.5 SAFETY (NEEDLE) ×3 IMPLANT
NS IRRIG 500ML POUR BTL (IV SOLUTION) ×2 IMPLANT
PACK BASIN DAY SURGERY FS (CUSTOM PROCEDURE TRAY) ×3 IMPLANT
PAD CLEANER CAUTERY TIP 5X5 (MISCELLANEOUS) ×2
PENCIL BUTTON HOLSTER BLD 10FT (ELECTRODE) ×3 IMPLANT
SLEEVE SCD COMPRESS KNEE MED (MISCELLANEOUS) ×3 IMPLANT
SPONGE GAUZE 4X4 12PLY STER LF (GAUZE/BANDAGES/DRESSINGS) ×2 IMPLANT
SPONGE LAP 4X18 X RAY DECT (DISPOSABLE) IMPLANT
SUCTION FRAZIER TIP 10 FR DISP (SUCTIONS) ×2 IMPLANT
SUT ETHILON 4 0 PS 2 18 (SUTURE) ×4 IMPLANT
SUT MNCRL AB 4-0 PS2 18 (SUTURE) ×2 IMPLANT
SUT SILK 3 0 TIES 17X18 (SUTURE)
SUT SILK 3-0 18XBRD TIE BLK (SUTURE) IMPLANT
SUT VIC AB 2-0 SH 27 (SUTURE) ×3
SUT VIC AB 2-0 SH 27XBRD (SUTURE) IMPLANT
SUT VIC AB 3-0 SH 27 (SUTURE) ×3
SUT VIC AB 3-0 SH 27X BRD (SUTURE) ×1 IMPLANT
SWAB CULTURE ESWAB REG 1ML (MISCELLANEOUS) IMPLANT
SWAB CULTURE LIQ STUART DBL (MISCELLANEOUS) IMPLANT
SYR BULB 3OZ (MISCELLANEOUS) ×2 IMPLANT
SYR CONTROL 10ML LL (SYRINGE) ×3 IMPLANT
TAPE HYPAFIX 6 X30' (GAUZE/BANDAGES/DRESSINGS) ×1
TAPE HYPAFIX 6X30 (GAUZE/BANDAGES/DRESSINGS) ×1 IMPLANT
TOWEL OR 17X24 6PK STRL BLUE (TOWEL DISPOSABLE) ×3 IMPLANT
TUBE ANAEROBIC SPECIMEN COL (MISCELLANEOUS) IMPLANT
YANKAUER SUCT BULB TIP NO VENT (SUCTIONS) IMPLANT

## 2015-11-06 NOTE — Anesthesia Postprocedure Evaluation (Signed)
Anesthesia Post Note  Patient: Johnny Mooney  Procedure(s) Performed: Procedure(s) (LRB): EXCISION MASS HEAD (N/A)  Patient location during evaluation: PACU Anesthesia Type: General Level of consciousness: awake and alert Pain management: pain level controlled Vital Signs Assessment: post-procedure vital signs reviewed and stable Respiratory status: spontaneous breathing, nonlabored ventilation, respiratory function stable and patient connected to nasal cannula oxygen Cardiovascular status: blood pressure returned to baseline and stable Postop Assessment: no signs of nausea or vomiting Anesthetic complications: no    Last Vitals:  Vitals:   11/06/15 0945 11/06/15 1000  BP: 105/65 106/72  Pulse: 67 62  Resp: 15 15  Temp:      Last Pain:  Vitals:   11/06/15 0945  TempSrc:   PainSc: 0-No pain                 Montez Hageman

## 2015-11-06 NOTE — Discharge Instructions (Signed)

## 2015-11-06 NOTE — Interval H&P Note (Signed)
History and Physical Interval Note:  11/06/2015 7:18 AM  Johnny Mooney  has presented today for surgery, with the diagnosis of Subcutaneous mass - posterior scalp 3 cm  The various methods of treatment have been discussed with the patient and family. After consideration of risks, benefits and other options for treatment, the patient has consented to  Procedure(s): EXCISION MASS HEAD (N/A) as a surgical intervention .  The patient's history has been reviewed, patient examined, no change in status, stable for surgery.  I have reviewed the patient's chart and labs.  Questions were answered to the patient's satisfaction.     [name]

## 2015-11-06 NOTE — Op Note (Signed)
Preoperative diagnosis: posterior scalp mass  Postoperative diagnosis: same   Procedure: excision of 5cm posterior scalp mass with complex closures Surgeon: Gurney Maxin, M.D.  Asst: none  Anesthesia: general  Indications for procedure: Johnny Mooney is a 44 y.o. year old male with symptoms of throbbing pain and enlarging mass in posterior scalp. The mass had been attempted drainage multiple times with minimal relief. He presents today for removal of the mass.  Description of procedure: The patient was brought into the operative suite. Anesthesia was administered with General endotracheal anesthesia. WHO checklist was applied. The patient was then placed in prone position. The area was prepped and draped in the usual sterile fashion.  Next elliptical incision was made over the mass.  Cautery was used to dissect down through the skin and then a mosquito was used to bluntly dissect the skin away from the mass as well as cautery.  In this fashion skin was completely removed from the mass mass appeared to be superficial to the occipital frontalis muscle.  We were able to use cautery to remove the mass away from the muscle.  Once completely removed intact, the area was irrigated.  A portion of the skin appeared very thin and was removed by scalpel.  Next a 2-0 Vicryl interrupted suture was used to bring the deep dermal aspect to the base of the wound.  Finally 4-0 nylon was used to close skin in vertical mattress.  Bacitracin gauze placed for dressing.  Patient awoke from anesthesia PACU in stable condition.  Findings: 5cm posterior scalp mass with serous drainage  Specimen: 5cm posterior scalp mass with skin  Implant: none   Blood loss: 134ml  Local anesthesia: 60ml 0.5% marcaine w epi  Complications: none  Gurney Maxin, M.D. General, Bariatric, & Minimally Invasive Surgery [facility]

## 2015-11-06 NOTE — H&P (View-Only) (Signed)
History of Present Illness Johnny Mooney. Choua Chalker MD; 11/04/2015 4:26 PM) The patient is a 45 year old male who presents with a complaint of Mass. Urgent office  Patient referred by [facility] emergency Department for a large cyst on his scalp.  This is a 45 year old male in good health who presents with several months of a recurrent mass on the posterior scalp. He has been to the emergency department several times and this was felt to represent an abscess. He has had this lanced several times but has never resolved. He continues to recur. Currently there is clear drainage coming from this area. He would like to have this definitively addressed to prevent any recurrence. He does not have a primary care physician.   Other Problems Elbert Ewings, CMA; 11/04/2015 2:29 PM) Back Pain  Past Surgical History Elbert Ewings, CMA; 11/04/2015 2:29 PM) Oral Surgery  Diagnostic Studies History Elbert Ewings, [place]; 11/04/2015 2:29 PM) Colonoscopy never  Allergies Elbert Ewings, CMA; 11/04/2015 2:30 PM) No Known Drug Allergies 11/04/2015  Medication History Elbert Ewings, CMA; 11/04/2015 2:30 PM) No Current Medications Medications Reconciled  Social History Elbert Ewings, CMA; 11/04/2015 2:29 PM) Alcohol use Moderate alcohol use. Caffeine use Carbonated beverages. Illicit drug use Uses socially only. Tobacco use Current some day smoker.  Family History Elbert Ewings, [place]; 11/04/2015 2:29 PM) Cerebrovascular Accident Family Members In General. Diabetes Mellitus Mother.     Review of Systems Elbert Ewings CMA; 11/04/2015 2:29 PM) General Not Present- Appetite Loss, Chills, Fatigue, Fever, Night Sweats, Weight Gain and Weight Loss. Skin Present- Change in Wart/Mole. Not Present- Dryness, Hives, Jaundice, New Lesions, Non-Healing Wounds, Rash and Ulcer. HEENT Not Present- Earache, Hearing Loss, Hoarseness, Nose Bleed, Oral Ulcers, Ringing in the Ears, Seasonal Allergies, Sinus Pain, Sore  Throat, Visual Disturbances, Wears glasses/contact lenses and Yellow Eyes. Respiratory Not Present- Bloody sputum, Chronic Cough, Difficulty Breathing, Snoring and Wheezing. Cardiovascular Not Present- Chest Pain, Difficulty Breathing Lying Down, Leg Cramps, Palpitations, Rapid Heart Rate, Shortness of Breath and Swelling of Extremities. Gastrointestinal Not Present- Abdominal Pain, Bloating, Bloody Stool, Change in Bowel Habits, Chronic diarrhea, Constipation, Difficulty Swallowing, Excessive gas, Gets full quickly at meals, Hemorrhoids, Indigestion, Nausea, Rectal Pain and Vomiting. Male Genitourinary Not Present- Blood in Urine, Change in Urinary Stream, Frequency, Impotence, Nocturia, Painful Urination, Urgency and Urine Leakage. Musculoskeletal Present- Swelling of Extremities. Not Present- Back Pain, Joint Pain, Joint Stiffness, Muscle Pain and Muscle Weakness. Neurological Present- Headaches, Numbness, Tingling and Weakness. Not Present- Decreased Memory, Fainting, Seizures, Tremor and Trouble walking. Psychiatric Not Present- Anxiety, Bipolar, Change in Sleep Pattern, Depression, Fearful and Frequent crying. Endocrine Present- Hair Changes. Not Present- Cold Intolerance, Excessive Hunger, Heat Intolerance, Hot flashes and New Diabetes. Hematology Not Present- Blood Thinners, Easy Bruising, Excessive bleeding, Gland problems, HIV and Persistent Infections.  Vitals Elbert Ewings CMA; 11/04/2015 2:30 PM) 11/04/2015 2:30 PM Weight: 171 lb Height: 70in Body Surface Area: 1.95 m Body Mass Index: 24.54 kg/m  Temp.: 32F(Temporal)  Pulse: 81 (Regular)  BP: 138/82 (Sitting, Left Arm, Standard)      Physical Exam Rodman Key K. Desani Sprung MD; 11/04/2015 4:27 PM)  The physical exam findings are as follows: Note:WDWN in NAD Scalp: right posterior scalp; soft well-demarcated 3 cm mass; 1 cm central ulceration with clear drainage; no erythema or induration Eyes: Pupils equal, round;  sclera anicteric HENT: Oral mucosa moist; good dentition Neck: No masses palpated, no thyromegaly Lungs: CTA bilaterally; normal respiratory effort CV: Regular rate and rhythm; no murmurs; extremities well-perfused with no edema  Abd: +bowel sounds, soft, non-tender, no palpable organomegaly; no palpable hernias Skin: Warm, dry; no sign of jaundice Psychiatric - alert and oriented x 4; calm mood and affect    Assessment & Plan Rodman Key K. Bronte Sabado MD; 11/04/2015 4:28 PM)  RUPTURED SEBACEOUS CYST (L72.0) Impression: 3 cm - posterior scalp  Current Plans Schedule for Surgery - Excision of subcutaneous mass - posterior scalp. The surgical procedure has been discussed with the patient. Potential risks, benefits, alternative treatments, and expected outcomes have been explained. All of the patient's questions at this time have been answered. The likelihood of reaching the patient's treatment goal is good. The patient understand the proposed surgical procedure and wishes to proceed. Note:I am unable to schedule this for the next several days so I asked my partner Dr. Lurena Joiner Kinsinger to take over the patient's care. He has agreed to do so and has talked to the patient in the office.  Johnny Mooney. Georgette Dover, MD, [facility]  General/ Trauma Surgery  11/04/2015 4:28 PM

## 2015-11-06 NOTE — Anesthesia Preprocedure Evaluation (Addendum)
Anesthesia Evaluation  Patient identified by MRN, date of birth, ID band Patient awake    Reviewed: Allergy & Precautions, NPO status , Patient's Chart, lab work & pertinent test results  Airway Mallampati: II  TM Distance: >3 FB Neck ROM: Full    Dental no notable dental hx. (+) Teeth Intact, Dental Advisory Given,    Pulmonary neg pulmonary ROS, Current Smoker,    Pulmonary exam normal breath sounds clear to auscultation       Cardiovascular negative cardio ROS Normal cardiovascular exam Rhythm:Regular Rate:Normal     Neuro/Psych negative neurological ROS  negative psych ROS   GI/Hepatic negative GI ROS, Neg liver ROS,   Endo/Other  negative endocrine ROS  Renal/GU negative Renal ROS  negative genitourinary   Musculoskeletal negative musculoskeletal ROS (+)   Abdominal   Peds negative pediatric ROS (+)  Hematology negative hematology ROS (+)   Anesthesia Other Findings   Reproductive/Obstetrics negative OB ROS                           Anesthesia Physical Anesthesia Plan  ASA: II  Anesthesia Plan: General   Post-op Pain Management:    Induction: Intravenous  Airway Management Planned: LMA and Oral ETT  Additional Equipment:   Intra-op Plan:   Post-operative Plan: Extubation in OR  Informed Consent: I have reviewed the patients History and Physical, chart, labs and discussed the procedure including the risks, benefits and alternatives for the proposed anesthesia with the patient or authorized representative who has indicated his/her understanding and acceptance.   Dental advisory given  Plan Discussed with: CRNA  Anesthesia Plan Comments:         Anesthesia Quick Evaluation

## 2015-11-06 NOTE — Anesthesia Procedure Notes (Signed)
Procedure Name: Intubation Date/Time: 11/06/2015 7:32 AM Performed by: Wanita Chamberlain Pre-anesthesia Checklist: Patient identified, Timeout performed, Emergency Drugs available, Suction available and Patient being monitored Patient Re-evaluated:Patient Re-evaluated prior to inductionOxygen Delivery Method: Circle system utilized Preoxygenation: Pre-oxygenation with 100% oxygen Intubation Type: IV induction Ventilation: Mask ventilation without difficulty Laryngoscope Size: Mac and 3 Grade View: Grade I Tube type: Oral Number of attempts: 1 Airway Equipment and Method: Stylet Placement Confirmation: ETT inserted through vocal cords under direct vision,  positive ETCO2 and breath sounds checked- equal and bilateral Secured at: 21 cm Tube secured with: Tape Dental Injury: Teeth and Oropharynx as per pre-operative assessment

## 2015-11-06 NOTE — Transfer of Care (Signed)
Immediate Anesthesia Transfer of Care Note  Patient: Johnny Mooney  Procedure(s) Performed: Procedure(s): EXCISION MASS HEAD (N/A)  Patient Location: PACU  Anesthesia Type:General  Level of Consciousness: awake, alert , oriented and patient cooperative  Airway & Oxygen Therapy: Patient Spontanous Breathing and Patient connected to nasal cannula oxygen  Post-op Assessment: Report given to RN and Post -op Vital signs reviewed and stable  Post vital signs: Reviewed and stable  Last Vitals:  Vitals:   11/06/15 0637  BP: 116/79  Pulse: 76  Resp: 20  Temp: 37.1 C    Last Pain:  Vitals:   11/06/15 0700  TempSrc:   PainSc: 10-Worst pain ever      Patients Stated Pain Goal: 10 ([phone] [phone])  Complications: No apparent anesthesia complications

## 2015-11-09 ENCOUNTER — Encounter (HOSPITAL_BASED_OUTPATIENT_CLINIC_OR_DEPARTMENT_OTHER): Payer: Self-pay | Admitting: General Surgery

## 2015-11-25 DIAGNOSIS — C801 Malignant (primary) neoplasm, unspecified: Secondary | ICD-10-CM

## 2015-11-25 HISTORY — DX: Malignant (primary) neoplasm, unspecified: C80.1

## 2015-12-07 ENCOUNTER — Other Ambulatory Visit (HOSPITAL_COMMUNITY): Payer: Self-pay | Admitting: General Surgery

## 2015-12-07 DIAGNOSIS — C4492 Squamous cell carcinoma of skin, unspecified: Secondary | ICD-10-CM

## 2015-12-10 ENCOUNTER — Encounter (HOSPITAL_COMMUNITY): Payer: Self-pay

## 2015-12-10 ENCOUNTER — Ambulatory Visit (HOSPITAL_COMMUNITY)
Admission: RE | Admit: 2015-12-10 | Discharge: 2015-12-10 | Disposition: A | Discharge status: Home / Self Care | Payer: Medicaid Other | Source: Ambulatory Visit | Attending: General Surgery | Admitting: General Surgery

## 2015-12-10 DIAGNOSIS — C779 Secondary and unspecified malignant neoplasm of lymph node, unspecified: Secondary | ICD-10-CM | POA: Insufficient documentation

## 2015-12-10 DIAGNOSIS — C4492 Squamous cell carcinoma of skin, unspecified: Secondary | ICD-10-CM | POA: Insufficient documentation

## 2015-12-10 HISTORY — DX: Malignant (primary) neoplasm, unspecified: C80.1

## 2015-12-10 MED ORDER — IOPAMIDOL (ISOVUE-300) INJECTION 61%
INTRAVENOUS | Status: AC
  Filled 2015-12-10: qty 75

## 2015-12-10 MED ORDER — IOPAMIDOL (ISOVUE-300) INJECTION 61%
100.0000 mL | Freq: Once | INTRAVENOUS | Status: AC | PRN
  Administered 2015-12-10: 75 mL via INTRAVENOUS

## 2016-01-01 NOTE — Patient Instructions (Addendum)
FERN MARTHA  01/01/2016   Your procedure is scheduled on: 01/07/16  Report to [facility] Main  Entrance take [facility]  elevators to 3rd floor to  [employer] at 9:00 AM.  Call this number if you have problems the morning of surgery [phone]   Remember: ONLY 1 PERSON MAY GO WITH YOU TO SHORT STAY TO GET  READY MORNING OF [place].  Do not eat food or drink liquids :After Midnight.     Take these medicines the morning of surgery with A SIP OF WATER: hydrocodone-acetaminophen if needed. DO NOT TAKE ANY DIABETIC MEDICATIONS DAY OF YOUR SURGERY                               You may not have any metal on your body including hair pins and              piercings  Do not wear jewelry, make-up, lotions, powders or perfumes, deodorant             Do not wear nail polish.  Do not shave  48 hours prior to surgery.              Men may shave face and neck.   Do not bring valuables to the hospital. [place].  Contacts, dentures or bridgework may not be worn into surgery.  Leave suitcase in the car. After surgery it may be brought to your room.     Patients discharged the day of surgery will not be allowed to drive home.  Name and phone number of your driver: Deneise Lever Fontanez-mother [phone] cell  Special Instructions: N/A              Please read over the following fact sheets you were given: _____________________________________________________________________             [facility] - Preparing for Surgery Before surgery, you can play an important role.  Because skin is not sterile, your skin needs to be as free of germs as possible.  You can reduce the number of germs on your skin by washing with CHG (chlorahexidine gluconate) soap before surgery.  CHG is an antiseptic cleaner which kills germs and bonds with the skin to continue killing germs even after washing. Please DO NOT use if you have an  allergy to CHG or antibacterial soaps.  If your skin becomes reddened/irritated stop using the CHG and inform your nurse when you arrive at Short Stay. Do not shave (including legs and underarms) for at least 48 hours prior to the first CHG shower.  You may shave your face/neck. Please follow these instructions carefully:  1.  Shower with CHG Soap the night before surgery and the  morning of Surgery.  2.  If you choose to wash your hair, wash your hair first as usual with your  normal  shampoo.  3.  After you shampoo, rinse your hair and body thoroughly to remove the  shampoo.                           4.  Use CHG as you would any other liquid soap.  You can apply chg directly  to the skin and wash                       Gently with a scrungie or clean washcloth.  5.  Apply the CHG Soap to your body ONLY FROM THE NECK DOWN.   Do not use on face/ open                           Wound or open sores. Avoid contact with eyes, ears mouth and genitals (private parts).                       Wash face,  Genitals (private parts) with your normal soap.             6.  Wash thoroughly, paying special attention to the area where your surgery  will be performed.  7.  Thoroughly rinse your body with warm water from the neck down.  8.  DO NOT shower/wash with your normal soap after using and rinsing off  the CHG Soap.                9.  Pat yourself dry with a clean towel.            10.  Wear clean pajamas.            11.  Place clean sheets on your bed the night of your first shower and do not  sleep with pets. Day of Surgery : Do not apply any lotions/deodorants the morning of surgery.  Please wear clean clothes to the hospital/surgery center.  FAILURE TO FOLLOW THESE INSTRUCTIONS MAY RESULT IN THE CANCELLATION OF YOUR SURGERY PATIENT SIGNATURE_________________________________  NURSE SIGNATURE__________________________________  ________________________________________________________________________

## 2016-01-01 NOTE — Progress Notes (Signed)
Pt has pre-op appointment Monday.  Needs pre-op orders. Thank you.

## 2016-01-04 ENCOUNTER — Encounter (HOSPITAL_COMMUNITY)
Admission: RE | Admit: 2016-01-04 | Discharge: 2016-01-04 | Disposition: A | Discharge status: Home / Self Care | Payer: Medicaid Other | Source: Ambulatory Visit | Attending: General Surgery | Admitting: General Surgery

## 2016-01-04 ENCOUNTER — Encounter (HOSPITAL_COMMUNITY): Payer: Self-pay

## 2016-01-04 DIAGNOSIS — Z01812 Encounter for preprocedural laboratory examination: Secondary | ICD-10-CM | POA: Diagnosis not present

## 2016-01-04 LAB — CBC
HCT: 40.5 % (ref 39.0–52.0)
Hemoglobin: 13.5 g/dL (ref 13.0–17.0)
MCH: 28.4 pg (ref 26.0–34.0)
MCHC: 33.3 g/dL (ref 30.0–36.0)
MCV: 85.3 fL (ref 78.0–100.0)
PLATELETS: 277 10*3/uL (ref 150–400)
RBC: 4.75 MIL/uL (ref 4.22–5.81)
RDW: 14.8 % (ref 11.5–15.5)
WBC: 14 10*3/uL — ABNORMAL HIGH (ref 4.0–10.5)

## 2016-01-04 NOTE — Pre-Procedure Instructions (Signed)
Need MD order entry in Epic- pt here now for PAT appointment.

## 2016-01-04 NOTE — Progress Notes (Signed)
01-04-16 1510 note WBC ,viewable in Epic.

## 2016-01-07 ENCOUNTER — Encounter (HOSPITAL_COMMUNITY)
Admission: RE | Disposition: A | Discharge status: Home / Self Care | Payer: Self-pay | Source: Ambulatory Visit | Attending: General Surgery

## 2016-01-07 ENCOUNTER — Encounter (HOSPITAL_COMMUNITY): Payer: Self-pay | Admitting: Anesthesiology

## 2016-01-07 ENCOUNTER — Ambulatory Visit (HOSPITAL_COMMUNITY): Payer: Medicaid Other | Admitting: Certified Registered Nurse Anesthetist

## 2016-01-07 ENCOUNTER — Ambulatory Visit (HOSPITAL_COMMUNITY)
Admission: RE | Admit: 2016-01-07 | Discharge: 2016-01-07 | Disposition: A | Discharge status: Home / Self Care | Payer: Medicaid Other | Source: Ambulatory Visit | Attending: General Surgery | Admitting: General Surgery

## 2016-01-07 DIAGNOSIS — F17290 Nicotine dependence, other tobacco product, uncomplicated: Secondary | ICD-10-CM | POA: Diagnosis not present

## 2016-01-07 DIAGNOSIS — C4442 Squamous cell carcinoma of skin of scalp and neck: Secondary | ICD-10-CM | POA: Diagnosis not present

## 2016-01-07 DIAGNOSIS — Z791 Long term (current) use of non-steroidal anti-inflammatories (NSAID): Secondary | ICD-10-CM | POA: Diagnosis not present

## 2016-01-07 DIAGNOSIS — C4490 Unspecified malignant neoplasm of skin, unspecified: Secondary | ICD-10-CM | POA: Diagnosis present

## 2016-01-07 HISTORY — PX: LYMPH NODE BIOPSY: SHX201

## 2016-01-07 HISTORY — PX: MASS EXCISION: SHX2000

## 2016-01-07 SURGERY — EXCISION MASS
Anesthesia: General | Site: Scalp

## 2016-01-07 MED ORDER — HYDROMORPHONE HCL 1 MG/ML IJ SOLN
INTRAMUSCULAR | Status: DC | PRN
  Administered 2016-01-07: .4 mg via INTRAVENOUS

## 2016-01-07 MED ORDER — MEPERIDINE HCL 50 MG/ML IJ SOLN: 6.2500 mg | INTRAMUSCULAR | Status: DC | PRN

## 2016-01-07 MED ORDER — FENTANYL CITRATE (PF) 100 MCG/2ML IJ SOLN
INTRAMUSCULAR | Status: AC
Start: 2016-01-07 — End: 2016-01-07
  Filled 2016-01-07: qty 2

## 2016-01-07 MED ORDER — BUPIVACAINE HCL (PF) 0.25 % IJ SOLN
INTRAMUSCULAR | Status: DC | PRN
  Administered 2016-01-07: 20 mL

## 2016-01-07 MED ORDER — HYDROMORPHONE HCL 2 MG/ML IJ SOLN
INTRAMUSCULAR | Status: AC
  Filled 2016-01-07: qty 1

## 2016-01-07 MED ORDER — FENTANYL CITRATE (PF) 100 MCG/2ML IJ SOLN
INTRAMUSCULAR | Status: AC
  Filled 2016-01-07: qty 2

## 2016-01-07 MED ORDER — CEFAZOLIN SODIUM-DEXTROSE 2-3 GM-% IV SOLR
INTRAVENOUS | Status: DC | PRN
  Administered 2016-01-07: 2 g via INTRAVENOUS

## 2016-01-07 MED ORDER — MIDAZOLAM HCL 5 MG/5ML IJ SOLN
INTRAMUSCULAR | Status: DC | PRN
Start: 2016-01-07 — End: 2016-01-07
  Administered 2016-01-07: 2 mg via INTRAVENOUS

## 2016-01-07 MED ORDER — IBUPROFEN 400 MG PO TABS
400.0000 mg | ORAL_TABLET | ORAL | Status: DC | PRN
  Administered 2016-01-07: 400 mg via ORAL
  Filled 2016-01-07: qty 1
  Filled 2016-01-07: qty 2
  Filled 2016-01-07: qty 1

## 2016-01-07 MED ORDER — 0.9 % SODIUM CHLORIDE (POUR BTL) OPTIME
TOPICAL | Status: DC | PRN
  Administered 2016-01-07: 1000 mL

## 2016-01-07 MED ORDER — LACTATED RINGERS IV SOLN: INTRAVENOUS | Status: DC

## 2016-01-07 MED ORDER — MIDAZOLAM HCL 2 MG/2ML IJ SOLN
INTRAMUSCULAR | Status: AC
  Filled 2016-01-07: qty 2

## 2016-01-07 MED ORDER — PROPOFOL 10 MG/ML IV BOLUS
INTRAVENOUS | Status: DC | PRN
  Administered 2016-01-07: 200 mg via INTRAVENOUS

## 2016-01-07 MED ORDER — DEXAMETHASONE SODIUM PHOSPHATE 10 MG/ML IJ SOLN
INTRAMUSCULAR | Status: AC
  Filled 2016-01-07: qty 1

## 2016-01-07 MED ORDER — BUPIVACAINE HCL (PF) 0.25 % IJ SOLN
INTRAMUSCULAR | Status: AC
  Filled 2016-01-07: qty 30

## 2016-01-07 MED ORDER — ONDANSETRON HCL 4 MG/2ML IJ SOLN: 4.0000 mg | Freq: Once | INTRAMUSCULAR | Status: DC | PRN

## 2016-01-07 MED ORDER — SUCCINYLCHOLINE CHLORIDE 200 MG/10ML IV SOSY
PREFILLED_SYRINGE | INTRAVENOUS | Status: AC
  Filled 2016-01-07: qty 10

## 2016-01-07 MED ORDER — SUGAMMADEX SODIUM 200 MG/2ML IV SOLN
INTRAVENOUS | Status: AC
  Filled 2016-01-07: qty 2

## 2016-01-07 MED ORDER — ONDANSETRON HCL 4 MG/2ML IJ SOLN
INTRAMUSCULAR | Status: DC | PRN
  Administered 2016-01-07: 4 mg via INTRAVENOUS

## 2016-01-07 MED ORDER — FENTANYL CITRATE (PF) 100 MCG/2ML IJ SOLN
INTRAMUSCULAR | Status: DC | PRN
  Administered 2016-01-07 (×7): 50 ug via INTRAVENOUS

## 2016-01-07 MED ORDER — IBUPROFEN 800 MG PO TABS: 800.0000 mg | ORAL_TABLET | Freq: Three times a day (TID) | ORAL | 0 refills | Status: DC | PRN

## 2016-01-07 MED ORDER — DEXAMETHASONE SODIUM PHOSPHATE 10 MG/ML IJ SOLN
INTRAMUSCULAR | Status: DC | PRN
  Administered 2016-01-07: 10 mg via INTRAVENOUS

## 2016-01-07 MED ORDER — FENTANYL CITRATE (PF) 100 MCG/2ML IJ SOLN
25.0000 ug | INTRAMUSCULAR | Status: DC | PRN
  Administered 2016-01-07: 50 ug via INTRAVENOUS

## 2016-01-07 MED ORDER — SUGAMMADEX SODIUM 200 MG/2ML IV SOLN
INTRAVENOUS | Status: DC | PRN
  Administered 2016-01-07: 150 mg via INTRAVENOUS

## 2016-01-07 MED ORDER — FENTANYL CITRATE (PF) 100 MCG/2ML IJ SOLN: 25.0000 ug | INTRAMUSCULAR | Status: DC | PRN

## 2016-01-07 MED ORDER — METOCLOPRAMIDE HCL 5 MG/ML IJ SOLN: 10.0000 mg | Freq: Once | INTRAMUSCULAR | Status: DC | PRN

## 2016-01-07 MED ORDER — LIDOCAINE 2% (20 MG/ML) 5 ML SYRINGE
INTRAMUSCULAR | Status: AC
  Filled 2016-01-07: qty 5

## 2016-01-07 MED ORDER — FENTANYL CITRATE (PF) 250 MCG/5ML IJ SOLN
INTRAMUSCULAR | Status: AC
  Filled 2016-01-07: qty 5

## 2016-01-07 MED ORDER — CEFAZOLIN SODIUM-DEXTROSE 2-4 GM/100ML-% IV SOLN
INTRAVENOUS | Status: AC
  Filled 2016-01-07: qty 100

## 2016-01-07 MED ORDER — ROCURONIUM BROMIDE 50 MG/5ML IV SOSY
PREFILLED_SYRINGE | INTRAVENOUS | Status: DC | PRN
  Administered 2016-01-07: 20 mg via INTRAVENOUS
  Administered 2016-01-07: 5 mg via INTRAVENOUS
  Administered 2016-01-07: 50 mg via INTRAVENOUS

## 2016-01-07 MED ORDER — ONDANSETRON HCL 4 MG/2ML IJ SOLN
INTRAMUSCULAR | Status: AC
  Filled 2016-01-07: qty 2

## 2016-01-07 MED ORDER — HYDROCODONE-ACETAMINOPHEN 7.5-325 MG PO TABS
1.0000 | ORAL_TABLET | Freq: Once | ORAL | Status: AC | PRN
  Administered 2016-01-07: 1 via ORAL
  Filled 2016-01-07: qty 1

## 2016-01-07 MED ORDER — LACTATED RINGERS IV SOLN
INTRAVENOUS | Status: DC | PRN
  Administered 2016-01-07 (×2): via INTRAVENOUS

## 2016-01-07 MED ORDER — LIDOCAINE 2% (20 MG/ML) 5 ML SYRINGE
INTRAMUSCULAR | Status: DC | PRN
  Administered 2016-01-07: 80 mg via INTRAVENOUS

## 2016-01-07 MED ORDER — ROCURONIUM BROMIDE 50 MG/5ML IV SOSY
PREFILLED_SYRINGE | INTRAVENOUS | Status: AC
  Filled 2016-01-07: qty 5

## 2016-01-07 MED ORDER — PROPOFOL 10 MG/ML IV BOLUS
INTRAVENOUS | Status: AC
  Filled 2016-01-07: qty 20

## 2016-01-07 MED ORDER — HYDROCODONE-ACETAMINOPHEN 5-325 MG PO TABS: 1.0000 | ORAL_TABLET | Freq: Four times a day (QID) | ORAL | 0 refills | Status: DC | PRN

## 2016-01-07 MED ORDER — MINERAL OIL LIGHT 100 % EX OIL
TOPICAL_OIL | CUTANEOUS | Status: AC
  Filled 2016-01-07: qty 25

## 2016-01-07 SURGICAL SUPPLY — 39 items
BLADE SURG SZ11 CARB STEEL (BLADE) ×3 IMPLANT
CHLORAPREP W/TINT 26ML (MISCELLANEOUS) ×3 IMPLANT
COVER SURGICAL LIGHT HANDLE (MISCELLANEOUS) ×3 IMPLANT
DECANTER SPIKE VIAL GLASS SM (MISCELLANEOUS) IMPLANT
DRAPE LAPAROSCOPIC ABDOMINAL (DRAPES) IMPLANT
DRAPE LAPAROTOMY TRNSV 102X78 (DRAPE) ×2 IMPLANT
DRSG PAD ABDOMINAL 8X10 ST (GAUZE/BANDAGES/DRESSINGS) IMPLANT
ELECT COATED BLADE 2.86 ST (ELECTRODE) ×2 IMPLANT
ELECT REM PT RETURN 9FT ADLT (ELECTROSURGICAL) ×3
ELECTRODE REM PT RTRN 9FT ADLT (ELECTROSURGICAL) ×1 IMPLANT
EVACUATOR DRAINAGE 7X20 100CC (MISCELLANEOUS) IMPLANT
EVACUATOR SILICONE 100CC (MISCELLANEOUS) ×3
GAUZE SPONGE 4X4 12PLY STRL (GAUZE/BANDAGES/DRESSINGS) ×4 IMPLANT
GLOVE BIOGEL PI IND STRL 7.0 (GLOVE) ×1 IMPLANT
GLOVE BIOGEL PI INDICATOR 7.0 (GLOVE) ×2
GLOVE SURG SS PI 7.0 STRL IVOR (GLOVE) ×3 IMPLANT
GOWN STRL REUS W/TWL LRG LVL3 (GOWN DISPOSABLE) ×3 IMPLANT
GOWN STRL REUS W/TWL XL LVL3 (GOWN DISPOSABLE) ×3 IMPLANT
KIT BASIN OR (CUSTOM PROCEDURE TRAY) ×3 IMPLANT
LUBRICANT JELLY K Y 4OZ (MISCELLANEOUS) IMPLANT
PACK GENERAL/GYN (CUSTOM PROCEDURE TRAY) ×3 IMPLANT
SPONGE LAP 18X18 X RAY DECT (DISPOSABLE) ×2 IMPLANT
SUCTION FRAZIER HANDLE 10FR (MISCELLANEOUS) ×2
SUCTION TUBE FRAZIER 10FR DISP (MISCELLANEOUS) IMPLANT
SUT ETHILON 3 0 PS 1 (SUTURE) ×8 IMPLANT
SUT ETHILON 4 0 PS 2 18 (SUTURE) ×6 IMPLANT
SUT MNCRL AB 4-0 PS2 18 (SUTURE) ×2 IMPLANT
SUT SILK 3 0 (SUTURE) ×3
SUT SILK 3 0 SH 30 (SUTURE) ×2 IMPLANT
SUT SILK 3-0 18XBRD TIE 12 (SUTURE) ×1 IMPLANT
SUT SILK 4 0 PS 2 (SUTURE) ×2 IMPLANT
SUT VIC AB 2-0 SH 27 (SUTURE) ×3
SUT VIC AB 2-0 SH 27X BRD (SUTURE) IMPLANT
SUT VIC AB 4-0 PS2 27 (SUTURE) ×2 IMPLANT
SWAB COLLECTION DEVICE MRSA (MISCELLANEOUS) IMPLANT
SWAB CULTURE ESWAB REG 1ML (MISCELLANEOUS) IMPLANT
TAPE CLOTH SURG 4X10 WHT LF (GAUZE/BANDAGES/DRESSINGS) ×2 IMPLANT
TOWEL OR 17X26 10 PK STRL BLUE (TOWEL DISPOSABLE) ×3 IMPLANT
TOWEL OR NON WOVEN STRL DISP B (DISPOSABLE) ×3 IMPLANT

## 2016-01-07 NOTE — Transfer of Care (Signed)
Immediate Anesthesia Transfer of Care Note  Patient: Johnny Mooney  Procedure(s) Performed: Procedure(s): EXCISION OF SCALP MASS (N/A) EXCISIONAL LYMPH NODE BIOPSY (N/A)  Patient Location: PACU  Anesthesia Type:General  Level of Consciousness:  sedated, patient cooperative and responds to stimulation  Airway & Oxygen Therapy:Patient Spontanous Breathing and Patient connected to face mask oxgen  Post-op Assessment:  Report given to PACU RN and Post -op Vital signs reviewed and stable  Post vital signs:  Reviewed and stable  Last Vitals:  Vitals:   01/07/16 0943 01/07/16 1330  BP: 110/70 (!) 149/101  Pulse: 82 81  Resp: 16 17  Temp: 37.2 C (P) 123XX123 C    Complications: No apparent anesthesia complications

## 2016-01-07 NOTE — H&P (Signed)
Johnny Mooney is an 45 y.o. male.   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 Since then he has had enlarging lymph nodes over the right posterior neck causing pain in the area.  Past Medical History:  Diagnosis Date  . 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 Cellulitis of left lower extremity    dx 09/ 2017 at ED took antibiotic per is healing  . History of traumatic head injury    closed -- no residual  . Subcutaneous mass of head    RIGHT POSTERIOR SCALP--  I&D at ED 09-16-2015 and 10-10-2015    Past Surgical History:  Procedure Laterality Date  . EXCISION MASS HEAD N/A 11/06/2015   Procedure: EXCISION MASS HEAD;  Surgeon: Arta Bruce Skylah Delauter, MD;  [facility];  Service: General;  Laterality: N/A;    Family History  Problem Relation Age of Onset  . Diabetes Mellitus II Mother   . Asthma Father    Social History:  reports that he has been smoking Cigars.  He has a 1.65 pack-year smoking history. He has never used smokeless tobacco. He reports that he drinks about 1.0 oz of alcohol per week . He reports that he does not use drugs.  Allergies: No Known Allergies  Medications Prior to Admission  Medication Sig Dispense Refill  . ibuprofen (ADVIL,MOTRIN) 200 MG tablet Take 200-800 mg by mouth every 6 (six) hours as needed (pain).    Marland Kitchen HYDROcodone-acetaminophen (NORCO/VICODIN) 5-325 MG tablet Take 1-2 tablets by mouth every 6 (six) hours as needed for moderate pain. (Patient not taking: Reported on 01/01/2016) 30 tablet 0  . ibuprofen (ADVIL,MOTRIN) 800 MG tablet Take 1 tablet (800 mg total) by mouth 3 (three) times daily. (Patient not taking: Reported on 01/01/2016) 21 tablet 0    No results found for this or any previous visit (from the past 48 hour(s)). No results found.  Review of Systems  Constitutional: Negative for chills and fever.  HENT:  Negative for hearing loss.   Eyes: Negative for blurred vision and double vision.  Respiratory: Negative for cough and hemoptysis.   Cardiovascular: Negative for chest pain and palpitations.  Gastrointestinal: Negative for abdominal pain, nausea and vomiting.  Genitourinary: Negative for dysuria and urgency.  Musculoskeletal: Negative for myalgias and neck pain.  Skin: Negative for itching and rash.  Neurological: Positive for headaches. Negative for dizziness and tingling.  Endo/Heme/Allergies: Does not bruise/bleed easily.  Psychiatric/Behavioral: Negative for depression and suicidal ideas.    Blood pressure 110/70, pulse 82, temperature 98.9 F (37.2 C), temperature source Oral, resp. rate 16, height 5\' 10"  (1.778 m), weight 81.6 kg (180 lb), SpO2 100 %. Physical Exam  Vitals reviewed. Constitutional: He is oriented to person, place, and time. He appears well-developed and well-nourished.  HENT:  Head: Normocephalic and atraumatic.  Eyes: Conjunctivae and EOM are normal. Pupils are equal, round, and reactive to light.  Neck: Normal range of motion. Neck supple.  Large nodules over posterior right neck  Cardiovascular: Normal rate and regular rhythm.   Respiratory: Effort normal and breath sounds normal.  GI: Soft. Bowel sounds are normal. He exhibits no distension. There is no tenderness.  Musculoskeletal: Normal range of motion.  Neurological: He is alert and oriented to person, place, and time.  Skin: Skin is warm and dry.  Psychiatric: He has a normal mood and affect. His behavior  is normal.     Assessment/Plan Excision of posterior neck lymph nodes and reexcision of skin cancer posterior scalp  Mickeal Skinner, MD 01/07/2016, 10:28 AM

## 2016-01-07 NOTE — Discharge Instructions (Signed)

## 2016-01-07 NOTE — Progress Notes (Addendum)
Dr Kieth Brightly is here to talk to patient about his diagnosis and follow up with Medical and radiation oncology.  1905 Patient is very angry with family that they did not share the information about his cancer being a stage III and need for chemo/radiation therapy. Emotional support given.

## 2016-01-07 NOTE — Progress Notes (Signed)
Dressing change to two surgical sites with 4x4 gauze and medipore tape. Old dressing is coming loose, uncomfortable to patient, and and soiled with blood.

## 2016-01-07 NOTE — Anesthesia Postprocedure Evaluation (Signed)
Anesthesia Post Note  Patient: Johnny Mooney  Procedure(s) Performed: Procedure(s) (LRB): EXCISION OF SCALP MASS (N/A) EXCISIONAL LYMPH NODE BIOPSY (N/A)  Patient location during evaluation: PACU Anesthesia Type: General Level of consciousness: awake and alert and oriented Pain management: pain level controlled Vital Signs Assessment: post-procedure vital signs reviewed and stable Respiratory status: spontaneous breathing, nonlabored ventilation and respiratory function stable Cardiovascular status: blood pressure returned to baseline and stable Postop Assessment: no signs of nausea or vomiting Anesthetic complications: no    Last Vitals:  Vitals:   01/07/16 1345 01/07/16 1400  BP: (!) 147/88 (!) 143/86  Pulse: 72 67  Resp: 17 17  Temp:      Last Pain:  Vitals:   01/07/16 1400  TempSrc:   PainSc: Asleep                 Viral Schramm A.

## 2016-01-07 NOTE — Anesthesia Preprocedure Evaluation (Addendum)
Anesthesia Evaluation  Patient identified by MRN, date of birth, ID band Patient awake    Reviewed: Allergy & Precautions, NPO status , Patient's Chart, lab work & pertinent test results  History of Anesthesia Complications (+) MALIGNANT HYPERTHERMIA  Airway Mallampati: II  TM Distance: >3 FB Neck ROM: Full    Dental no notable dental hx. (+) Poor Dentition, Chipped,    Pulmonary Current Smoker,    Pulmonary exam normal breath sounds clear to auscultation       Cardiovascular negative cardio ROS Normal cardiovascular exam Rhythm:Regular Rate:Normal     Neuro/Psych negative neurological ROS  negative psych ROS   GI/Hepatic negative GI ROS, Neg liver ROS,   Endo/Other  negative endocrine ROS  Renal/GU negative Renal ROS  negative genitourinary   Musculoskeletal Squamous cell Ca mass head   Abdominal   Peds  Hematology negative hematology ROS (+)   Anesthesia Other Findings   Reproductive/Obstetrics                            Anesthesia Physical Anesthesia Plan  ASA: II  Anesthesia Plan: General   Post-op Pain Management:    Induction: Intravenous  Airway Management Planned: Oral ETT  Additional Equipment:   Intra-op Plan:   Post-operative Plan: Extubation in OR  Informed Consent: I have reviewed the patients History and Physical, chart, labs and discussed the procedure including the risks, benefits and alternatives for the proposed anesthesia with the patient or authorized representative who has indicated his/her understanding and acceptance.   Dental advisory given  Plan Discussed with: CRNA, Anesthesiologist and Surgeon  Anesthesia Plan Comments:         Anesthesia Quick Evaluation

## 2016-01-07 NOTE — Anesthesia Procedure Notes (Addendum)
Procedure Name: Intubation Date/Time: 01/07/2016 10:59 AM Performed by: West Pugh Pre-anesthesia Checklist: Patient identified, Emergency Drugs available, Suction available, Patient being monitored and Timeout performed Patient Re-evaluated:Patient Re-evaluated prior to inductionOxygen Delivery Method: Circle system utilized Preoxygenation: Pre-oxygenation with 100% oxygen Intubation Type: IV induction Ventilation: Oral airway inserted - appropriate to patient size and Mask ventilation without difficulty Laryngoscope Size: Mac and 4 Grade View: Grade III Tube type: Oral Tube size: 7.5 mm Number of attempts: 2 Placement Confirmation: ETT inserted through vocal cords under direct vision,  positive ETCO2,  CO2 detector and breath sounds checked- equal and bilateral Secured at: 22 cm Tube secured with: Tape Dental Injury: Teeth and Oropharynx as per pre-operative assessment  Comments: DLx1 by SRNA will miller blade, no view. DL by anesthesiologist with Mac blade 4, Grade 3 view with successful placement of ETT.

## 2016-01-07 NOTE — Progress Notes (Signed)
Pt has multiple questions post op; pt has been resting comfortably post op and states pain woke him up and does not understand why he has pain post op; states the pain should be gone if he "removed what he should have done"; nurse tried to explain pt will have pain with surgery; pain meds given

## 2016-01-07 NOTE — Op Note (Addendum)
Preoperative diagnosis: squamous cell cancer  Postoperative diagnosis: same   Procedure: excision of posterior lymph node, reexcision of scalp mass Surgeon: Gurney Maxin, M.D.  Asst: none  Anesthesia: general  Indications for procedure: Johnny Mooney is a 45 y.o. year old male with symptoms of draining from cyst in scalp, he underwent initial excision which showed squamous cell cancer. After excision deep margin returned positive. In the meantime he also had enlarging nodes in the posterior right neck. He now presents for reexcision of squamous cell of scalp andexcisional lymph node biopsy of posterior neck.  Description of procedure: The patient was brought into the operative suite. Anesthesia was administered with General endotracheal anesthesia. WHO checklist was applied. The patient was then placed in prone position. The area was prepped and draped in the usual sterile fashion.   Oblique incision was made directly over the palpable masses in the posterior neck.  Cautery was used to create flaps and subcutaneous area. Cautery and blunt dissection were used to completely encircle the masses. The masses removed in entirety approximately 6cm in greatest dimension. Bleeding was controlled with cautery and 2 3-0 silk ties.  In palpating the area there were no further firm nodules. A  Portion of the mass was sent for frozen and returned as malignancy without signs of lymph node concerning for soft tissue met. Further dissection towards the posterior aspect of the field was performed to try and remove one additional lymph node however visualization of nerve-like structure led me to stop further dissection as positive for regional spread would likely require additional treatment and surgical eradication would not change the outcome. Therefore to avoid injury dissection stopped.  Next attention was turned towards previous dissection area and incision was made over the scar cautery was used to  dissect down through subcutaneous anus tissues and the central region under the scar was dissected free of underlying tissue down to periosteal tissue. The underlying tissue was removed in bulk, approximately 3cm by 2cm, and marked with short silk as superior, long silk as right, Vicryl as superficial. Irrigation occurred skin was freed up in both areas to allow closure. 3-0 nylon in vertical mattress and horizontal mattress was used to close skin in interrupted fashion.  Finally attention was turned back to the neck incision which was irrigated and then closed with 3-0 nylon in vertical mattress fashion. Bandages were applied over incisions.  Patient was brought to PACU in stable condition.  Findings: malignancy of neck mass  Specimen: frozen sample of neck mass, additional neck mass for permanent (5cm in size), reexcision of scalp squamous cell cancer (3cm in size)  Implant: none   Blood loss: 380m  Local anesthesia: 30 ml 0.25% marcaine with epinephrine  Complications: none  LGurney Maxin M.D. General, Bariatric, & Minimally Invasive Surgery [facility]

## 2016-01-07 NOTE — Progress Notes (Signed)
Pt states he doesn't think painful area on right neck was biopsied correctly. Requests to speak to surgeon before being discharged.  Dr Kieth Brightly paged in [place] and to see Pt when he is available.  Family at bedside. Report passed to [facility], RN.  Pt ambulates to bathroom and urinates.

## 2016-01-08 ENCOUNTER — Telehealth: Payer: Self-pay | Admitting: *Deleted

## 2016-01-08 ENCOUNTER — Encounter (HOSPITAL_COMMUNITY): Payer: Self-pay | Admitting: General Surgery

## 2016-01-08 ENCOUNTER — Encounter: Payer: Self-pay | Admitting: Radiation Oncology

## 2016-01-08 NOTE — Telephone Encounter (Signed)
Dr. 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 Lisbeth Renshaw had treated this gentleman in Epic, will give referral to our scheduler Threasa Beards, thanked MD for calling, forwarded this message to [facility] to  schedule with [name] who does head and neck, Dr. Lisbeth Renshaw is out of the office today and all next week 9:38 AM

## 2016-01-12 ENCOUNTER — Other Ambulatory Visit: Payer: Self-pay | Admitting: *Deleted

## 2016-01-12 ENCOUNTER — Encounter: Payer: Self-pay | Admitting: Radiation Oncology

## 2016-01-12 DIAGNOSIS — C779 Secondary and unspecified malignant neoplasm of lymph node, unspecified: Secondary | ICD-10-CM

## 2016-01-13 ENCOUNTER — Other Ambulatory Visit: Payer: Self-pay | Admitting: *Deleted

## 2016-01-13 ENCOUNTER — Telehealth: Payer: Self-pay | Admitting: *Deleted

## 2016-01-13 DIAGNOSIS — C770 Secondary and unspecified malignant neoplasm of lymph nodes of head, face and neck: Secondary | ICD-10-CM

## 2016-01-13 NOTE — Telephone Encounter (Signed)
Oncology Nurse Navigator Documentation  In follow-up to discussion at this morning's H&N Conference and per Dr. Constance Holster, LVM with his MA Olivia Mackie with request for next available appt with Dr. Constance Holster.  Requested call back with appt date/time.  Gayleen Orem, RN, BSN, [place] Neck Oncology Nurse [place] at [place] [phone]

## 2016-01-13 NOTE — Telephone Encounter (Signed)
Oncology Nurse Navigator Documentation  Received VMM from [facility], Dr. Janeice Robinson office, indicating patient has appt with Dr. Constance Holster 12/28 at 1:00 pm.  [name] informed.  Gayleen Orem, RN, BSN, [place] Neck Oncology Nurse [place] at [place] [phone]

## 2016-01-14 ENCOUNTER — Telehealth: Payer: Self-pay | Admitting: *Deleted

## 2016-01-14 ENCOUNTER — Other Ambulatory Visit: Payer: Self-pay | Admitting: *Deleted

## 2016-01-14 DIAGNOSIS — C770 Secondary and unspecified malignant neoplasm of lymph nodes of head, face and neck: Secondary | ICD-10-CM

## 2016-01-14 NOTE — Telephone Encounter (Signed)
Oncology Nurse Navigator Documentation  Called patient to make new referral introduction, confirm upcoming and pending appts.  LVM requesting call back.  Gayleen Orem, RN, BSN, [place] [facility]ck Oncology Nurse [place] at [place] [phone]

## 2016-01-15 ENCOUNTER — Encounter (HOSPITAL_COMMUNITY): Payer: Self-pay | Admitting: Emergency Medicine

## 2016-01-15 ENCOUNTER — Telehealth: Payer: Self-pay | Admitting: *Deleted

## 2016-01-15 ENCOUNTER — Emergency Department (HOSPITAL_COMMUNITY)
Admission: EM | Admit: 2016-01-15 | Discharge: 2016-01-15 | Disposition: A | Discharge status: Home / Self Care | Payer: Medicaid Other | Attending: Emergency Medicine | Admitting: Emergency Medicine

## 2016-01-15 DIAGNOSIS — T8132XA Disruption of internal operation (surgical) wound, not elsewhere classified, initial encounter: Secondary | ICD-10-CM | POA: Insufficient documentation

## 2016-01-15 DIAGNOSIS — Z79899 Other long term (current) drug therapy: Secondary | ICD-10-CM | POA: Insufficient documentation

## 2016-01-15 DIAGNOSIS — Y69 Unspecified misadventure during surgical and medical care: Secondary | ICD-10-CM | POA: Diagnosis not present

## 2016-01-15 DIAGNOSIS — T8130XA Disruption of wound, unspecified, initial encounter: Secondary | ICD-10-CM

## 2016-01-15 DIAGNOSIS — Z85828 Personal history of other malignant neoplasm of skin: Secondary | ICD-10-CM | POA: Insufficient documentation

## 2016-01-15 DIAGNOSIS — L03811 Cellulitis of head [any part, except face]: Secondary | ICD-10-CM

## 2016-01-15 DIAGNOSIS — F17290 Nicotine dependence, other tobacco product, uncomplicated: Secondary | ICD-10-CM | POA: Insufficient documentation

## 2016-01-15 MED ORDER — HYDROCODONE-ACETAMINOPHEN 5-325 MG PO TABS: 1.0000 | ORAL_TABLET | Freq: Four times a day (QID) | ORAL | 0 refills | Status: DC | PRN

## 2016-01-15 MED ORDER — CEPHALEXIN 500 MG PO CAPS: 500.0000 mg | ORAL_CAPSULE | Freq: Three times a day (TID) | ORAL | 0 refills | Status: DC

## 2016-01-15 MED ORDER — CEPHALEXIN 500 MG PO CAPS
500.0000 mg | ORAL_CAPSULE | Freq: Once | ORAL | Status: AC
Start: 2016-01-15 — End: 2016-01-15
  Administered 2016-01-15: 500 mg via ORAL
  Filled 2016-01-15: qty 1

## 2016-01-15 MED ORDER — HYDROCODONE-ACETAMINOPHEN 5-325 MG PO TABS
1.0000 | ORAL_TABLET | Freq: Once | ORAL | Status: AC
  Administered 2016-01-15: 1 via ORAL
  Filled 2016-01-15: qty 1

## 2016-01-15 NOTE — ED Triage Notes (Signed)
Patient wound open from surgery that he had on his neck last Thursday. It was a biopsy that was done.

## 2016-01-15 NOTE — ED Provider Notes (Signed)
[facility] DEPT Provider Note   CSN: [id number] Arrival date & time: 01/15/16  1903     History   Chief Complaint Chief Complaint  Patient presents with  . Wound Dehiscence    HPI Johnny Mooney is a 45 y.o. male.  The history is provided by the patient.  Wound Check  This is a new problem. The current episode started 6 to 12 hours ago. The problem occurs constantly. The problem has not changed since onset.Associated symptoms comments: Drainage from wounds. Nothing aggravates the symptoms. Nothing relieves the symptoms. He has tried nothing for the symptoms.    Past Medical History:  Diagnosis Date  . 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 Cellulitis of left lower extremity    dx 09/ 2017 at ED took antibiotic per is healing  . History of traumatic head injury    closed -- no residual  . Subcutaneous mass of head    RIGHT POSTERIOR SCALP--  I&D at ED 09-16-2015 and 10-10-2015    Patient Active Problem List   Diagnosis Date Noted  . Cellulitis 02/20/2012  . Tobacco abuse 02/20/2012    Past Surgical History:  Procedure Laterality Date  . EXCISION MASS HEAD N/A 11/06/2015   Procedure: EXCISION MASS HEAD;  Surgeon: Mickeal Skinner, MD;  Location: [facility];  Service: General;  Laterality: N/A;  . LYMPH NODE BIOPSY N/A 01/07/2016   Procedure: EXCISIONAL LYMPH NODE BIOPSY;  Surgeon: [name], MD;  Location: [facility];  Service: General;  Laterality: N/A;  . MASS EXCISION N/A 01/07/2016   Procedure: EXCISION OF SCALP MASS;  Surgeon: [name], MD;  Location: [facility];  Service: General;  Laterality: N/A;       Home Medications    Prior to Admission medications   Medication Sig Start Date End Date Taking? Authorizing Provider  HYDROcodone-acetaminophen (NORCO/VICODIN) 5-325 MG tablet Take 1-2 tablets by mouth every 6 (six) hours as needed for moderate pain. 01/07/16  Yes [name], MD  ibuprofen  (ADVIL,MOTRIN) 200 MG tablet Take 200-800 mg by mouth every 6 (six) hours as needed (pain).   Yes Historical Provider, MD  ibuprofen (ADVIL,MOTRIN) 800 MG tablet Take 1 tablet (800 mg total) by mouth every 8 (eight) hours as needed. 01/07/16  Yes Mickeal Skinner, MD    Family History Family History  Problem Relation Age of Onset  . Diabetes Mellitus II Mother   . Asthma Father     Social History Social History  Substance Use Topics  . Smoking status: Current Every Day Smoker    Packs/day: 0.33    Years: 5.00    Types: Cigars  . Smokeless tobacco: Never Used     Comment: quit cigarette smoking 2013, currently smokes 2 cigars daily  . Alcohol use 1.0 oz/week    2 Standard drinks or equivalent per week     Comment: couple of beers on the weekends     Allergies   Patient has no known allergies.   Review of Systems Review of Systems  Skin: Positive for wound.  All other systems reviewed and are negative.    Physical Exam Updated Vital Signs BP 103/65 (BP Location: Right Arm)   Pulse 90   Temp 99.6 F (37.6 C) (Oral)   Resp 16   Ht 5\' 10"  (1.778 m)   Wt 178 lb (80.7 kg)   SpO2 97%   BMI 25.54 kg/m   Physical Exam  Constitutional: He  is oriented to person, place, and time. He appears well-developed and well-nourished. No distress.  HENT:  Head: Normocephalic and atraumatic.  Nose: Nose normal.  Eyes: Conjunctivae are normal.  Neck: Neck supple. No tracheal deviation present.  Cardiovascular: Normal rate and regular rhythm.   Pulmonary/Chest: Effort normal. No respiratory distress.  Abdominal: Soft. He exhibits no distension.  Neurological: He is alert and oriented to person, place, and time.  Skin: Skin is warm and dry.  1 cm dehiscence of superior surgical wound with scant drainage. 3 cm dehiscence of inferior surgical wound with scant drainage and mild induration and erythema or lateral aspect.  Psychiatric: He has a normal mood and affect.        ED Treatments / Results  Labs (all labs ordered are listed, but only abnormal results are displayed) Labs Reviewed - No data to display  EKG  EKG Interpretation None       Radiology No results found.  Procedures Procedures (including critical care time)  Medications Ordered in ED Medications  HYDROcodone-acetaminophen (NORCO/VICODIN) 5-325 MG per tablet 1 tablet (not administered)    Initial Impression / Assessment and Plan / ED Course  I have reviewed the triage vital signs and the nursing notes.  Pertinent labs & imaging results that were available during my care of the patient were reviewed by me and considered in my medical decision making (see chart for details).  Clinical Course    45 y.o. male presents with dehiscence of surgical wounds that occurred today. He is 8 days s/p mass and lymph node removal on right posterior scalp and neck. Unable to close wounds 2/2 risk of infection, discussed at length with patient who is upset that he was told to come here for wound closure. I turned my head to cough during exam and Pt was concerned I coughed into wounds which I assured him I did not. I apologized for any misunderstanding.    I discussed the case with Dr Lucia Gaskins who is on call for general surgery group and he was able to review pictures. He agreed wounds would be unable to be closed given appearance and agreed with antibiotic therapy. He recommended cleaning the wounds and wet to dry dressings for secondary intention healing.   The patient is to follow up in [place] surgery clinic early next week after the holiday to have his wounds reassessed after ABx therapy and strict return precautions were discussed for increasing redness, fevers, purulence or other concerning signs or symptoms.   Final Clinical Impressions(s) / ED Diagnoses   Final diagnoses:  Wound dehiscence  Cellulitis of head except face    New Prescriptions Discharge Medication List as  of 01/15/2016 11:33 PM    START taking these medications   Details  cephALEXin (KEFLEX) 500 MG capsule Take 1 capsule (500 mg total) by mouth 3 (three) times daily., Starting Fri 01/15/2016, Print         Leo Grosser, MD 01/16/16 [phone]

## 2016-01-15 NOTE — Telephone Encounter (Signed)
CALLED PATIENT TO INFORM OF PET ON 01-29-16 AND HIS CTS ON 02-01-16 @ [facility], SPOKE WITH PATIENT AND HE IS AWARE OF THESE SCANS

## 2016-01-20 ENCOUNTER — Telehealth: Payer: Self-pay | Admitting: *Deleted

## 2016-01-20 NOTE — Telephone Encounter (Signed)
Oncology Nurse Navigator Documentation  Placed introductory call to new referral patient Johnny Mooney.  Introduced myself as the H&N oncology nurse navigator that works with [name] to whom he has been referred by Dr.   He confirmed his understanding of referral and appt date/time of 02/03/15 1300 NE, 1330 [name].  I briefly explained my role as his navigator, indicated that I would be joining him during his appt.    I confirmed his understanding of 1/5 1400 PET and 1/8 0930 at [facility].  I confirmed his understanding of [place] location, explained arrival and RadOnc registration process for appt.  I provided my contact information, encouraged him to call with questions/concerns.  He verbalized understanding of information provided, expressed appreciation for my call.  Gayleen Orem, RN, BSN, [place] Oncology Nurse [place] at [place] [phone]

## 2016-01-27 ENCOUNTER — Telehealth: Payer: Self-pay | Admitting: Surgery

## 2016-01-27 DIAGNOSIS — C4442 Squamous cell carcinoma of skin of scalp and neck: Secondary | ICD-10-CM

## 2016-01-27 NOTE — Progress Notes (Signed)
error 

## 2016-01-27 NOTE — Telephone Encounter (Signed)
AUDY DAUPHINE  [birth year] [id number]  Patient Care Team: No Pcp Per Patient as PCP - General (General Practice) Leota Sauers, RN as Oncology Nurse Navigator Eppie Gibson, MD as Attending Physician (Radiation Oncology) Heath Lark, MD as Consulting Physician (Hematology and Oncology) Karie Mainland, RD as Dietitian (Nutrition) Mickeal Skinner, MD as Consulting Physician (General Surgery)  This patient is a 46 y.o.male who calls today for surgical evaluation.   Date of procedure/visit: 01/07/2016  Surgery: Procedure: excision of posterior lymph node, reexcision of scalp mass Surgeon: Gurney Maxin, M.D.  Reason for call: Swelling of neck at incision.  The patient is a 46 year old male presenting for a post-operative visit. The patient is status post excision of posterior scalp cyst 10/13. 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 He had wound dehiscence after the first surgery and developed swollen lymph nodes in his posterior cervical region. He went back to the OR 12/14 for reexcision of the scalp squamous cell and a lymph node biopsy. He was seen in the ED 12/22 and noted to have wound dehiscence of both wounds.   He is followed up in clinic last week.  Had some necrotic fat excised.  Had a few stitches removed.  Wound left open.  Placed on doxycycline.  He calls [place] evening saying he has to be admitted as it has gotten a lot more swollen and he is concerned.  He was informing him is going to the emergency room.  I called and left a message for Elvina Sidle ED RN in charge the patient may be coming.  Evaluate the patient and see if he needs a CT scan or further intervention.  01/07/2016  Preoperative diagnosis: squamous cell cancer  Postoperative diagnosis: same   Procedure: excision of posterior lymph node, reexcision of scalp mass Surgeon: Gurney Maxin, M.D.  Asst: none  Anesthesia: general  Indications for procedure:  BENICIO MANNA is a 46 y.o. year old male with symptoms of draining from cyst in scalp, he underwent initial excision which showed squamous cell cancer. After excision deep margin returned positive. In the meantime he also had enlarging nodes in the posterior right neck. He now presents for reexcision of squamous cell of scalp andexcisional lymph node biopsy of posterior neck.  Description of procedure: The patient was brought into the operative suite. Anesthesia was administered with General endotracheal anesthesia. WHO checklist was applied. The patient was then placed in prone position. The area was prepped and draped in the usual sterile fashion.   Oblique incision was made directly over the palpable masses in the posterior neck.  Cautery was used to create flaps and subcutaneous area. Cautery and blunt dissection were used to completely encircle the masses. The masses removed in entirety approximately 6cm in greatest dimension. Bleeding was controlled with cautery and 2 3-0 silk ties.  In palpating the area there were no further firm nodules. A  Portion of the mass was sent for frozen and returned as malignancy without signs of lymph node concerning for soft tissue met. Further dissection towards the posterior aspect of the field was performed to try and remove one additional lymph node however visualization of nerve-like structure led me to stop further dissection as positive for regional spread would likely require additional treatment and surgical eradication would not change the outcome. Therefore to avoid injury dissection stopped.  Next attention was turned towards previous dissection area and incision was made over the scar cautery was used  to dissect down through subcutaneous anus tissues and the central region under the scar was dissected free of underlying tissue down to periosteal tissue. The underlying tissue was removed in bulk, approximately 3cm by 2cm, and marked with short silk  as superior, long silk as right, Vicryl as superficial. Irrigation occurred skin was freed up in both areas to allow closure. 3-0 nylon in vertical mattress and horizontal mattress was used to close skin in interrupted fashion.  Finally attention was turned back to the neck incision which was irrigated and then closed with 3-0 nylon in vertical mattress fashion. Bandages were applied over incisions.  Patient was brought to PACU in stable condition.  Findings: malignancy of neck mass  Specimen: frozen sample of neck mass, additional neck mass for permanent (5cm in size), reexcision of scalp squamous cell cancer (3cm in size)  Implant: none   Blood loss: 373m  Local anesthesia: 30 ml 0.25% marcaine with epinephrine  Complications: none  LGurney Maxin M.D. General, Bariatric, & Minimally Invasive [facility]   11/06/2015  Preoperative diagnosis: posterior scalp mass  Postoperative diagnosis: same   Procedure: excision of 5cm posterior scalp mass with complex closures Surgeon: LGurney Maxin M.D.  Asst: none  Anesthesia: general  Indications for procedure: FAVERY KLINGBEILis a 46y.o. year old male with symptoms of throbbing pain and enlarging mass in posterior scalp. The mass had been attempted drainage multiple times with minimal relief. He presents today for removal of the mass.  Description of procedure: The patient was brought into the operative suite. Anesthesia was administered with General endotracheal anesthesia. WHO checklist was applied. The patient was then placed in prone position. The area was prepped and draped in the usual sterile fashion.  Next elliptical incision was made over the mass.  Cautery was used to dissect down through the skin and then a mosquito was used to bluntly dissect the skin away from the mass as well as cautery.  In this fashion skin was completely removed from the mass mass appeared to be  superficial to the occipital frontalis muscle.  We were able to use cautery to remove the mass away from the muscle.  Once completely removed intact, the area was irrigated.  A portion of the skin appeared very thin and was removed by scalpel.  Next a 2-0 Vicryl interrupted suture was used to bring the deep dermal aspect to the base of the wound.  Finally 4-0 nylon was used to close skin in vertical mattress.  Bacitracin gauze placed for dressing.  Patient awoke from anesthesia PACU in stable condition.  Findings: 5cm posterior scalp mass with serous drainage  Specimen: 5cm posterior scalp mass with skin  Implant: none   Blood loss: 1081m Local anesthesia: 1666m.5% marcaine w epi  Complications: none  LukGurney Maxin.D. General, Bariatric, & Minimally Invasive Surgery [facility]   Patient Active Problem List   Diagnosis Date Noted  . Cellulitis 02/20/2012  . Tobacco abuse 02/20/2012    Past Medical History:  Diagnosis Date  . Cancer [place]   sq cell skin  . Cellulitis of left lower extremity    dx 09/ 2017 at ED took antibiotic per is healing  . History of traumatic head injury    closed -- no residual  . Subcutaneous mass of head    RIGHT POSTERIOR SCALP--  I&D at ED 09-16-2015 and 10-10-2015    Past Surgical History:  Procedure Laterality Date  .  EXCISION MASS HEAD N/A 11/06/2015   Procedure: EXCISION MASS HEAD;  Surgeon: Mickeal Skinner, MD;  Location: [facility];  Service: General;  Laterality: N/A;  . LYMPH NODE BIOPSY N/A 01/07/2016   Procedure: EXCISIONAL LYMPH NODE BIOPSY;  Surgeon: [name], MD;  Location: [facility];  Service: General;  Laterality: N/A;  . MASS EXCISION N/A 01/07/2016   Procedure: EXCISION OF SCALP MASS;  Surgeon: [name], MD;  Location: [facility];  Service: General;  Laterality: N/A;    Social History   Social History  . Marital status: Single    Spouse name: N/A   . Number of children: N/A  . Years of education: N/A   Occupational History  . Not on file.   Social History Main Topics  . Smoking status: Current Every Day Smoker    Packs/day: 0.33    Years: 5.00    Types: Cigars  . Smokeless tobacco: Never Used     Comment: quit cigarette smoking 2013, currently smokes 2 cigars daily  . Alcohol use 1.0 oz/week    2 Standard drinks or equivalent per week     Comment: couple of beers on the weekends  . Drug use: No     Comment: former use of marijuana  . Sexual activity: Not on file   Other Topics Concern  . Not on file   Social History Narrative  . No narrative on file    Family History  Problem Relation Age of Onset  . Diabetes Mellitus II Mother   . Asthma Father     Current Outpatient Prescriptions  Medication Sig Dispense Refill  . cephALEXin (KEFLEX) 500 MG capsule Take 1 capsule (500 mg total) by mouth 3 (three) times daily. 30 capsule 0  . HYDROcodone-acetaminophen (NORCO/VICODIN) 5-325 MG tablet Take 1 tablet by mouth every 6 (six) hours as needed for severe pain. 6 tablet 0  . ibuprofen (ADVIL,MOTRIN) 200 MG tablet Take 200-800 mg by mouth every 6 (six) hours as needed (pain).    Marland Kitchen ibuprofen (ADVIL,MOTRIN) 800 MG tablet Take 1 tablet (800 mg total) by mouth every 8 (eight) hours as needed. 30 tablet 0   No current facility-administered medications for this visit.      No Known Allergies  _0 @  No results found.  Note: This dictation was prepared with Dragon/digital dictation along with [employer]. Any transcriptional errors that result from this process are unintentional.   .Adin Hector, M.D., F.A.C.S. Gastrointestinal and Minimally Invasive Surgery [facility] [address] [place], [place] [id number] [phone] [address] / Paging  01/27/2016 5:50 PM

## 2016-01-28 ENCOUNTER — Inpatient Hospital Stay (HOSPITAL_COMMUNITY)
Admission: EM | Admit: 2016-01-28 | Discharge: 2016-02-02 | DRG: 863 | Disposition: A | Discharge status: Home / Self Care | Payer: Medicaid Other | Attending: General Surgery | Admitting: General Surgery

## 2016-01-28 ENCOUNTER — Encounter (HOSPITAL_COMMUNITY): Payer: Self-pay | Admitting: Emergency Medicine

## 2016-01-28 DIAGNOSIS — T814XXA Infection following a procedure, initial encounter: Principal | ICD-10-CM | POA: Diagnosis present

## 2016-01-28 DIAGNOSIS — F17290 Nicotine dependence, other tobacco product, uncomplicated: Secondary | ICD-10-CM | POA: Diagnosis present

## 2016-01-28 DIAGNOSIS — Z833 Family history of diabetes mellitus: Secondary | ICD-10-CM

## 2016-01-28 DIAGNOSIS — L089 Local infection of the skin and subcutaneous tissue, unspecified: Secondary | ICD-10-CM

## 2016-01-28 DIAGNOSIS — Z825 Family history of asthma and other chronic lower respiratory diseases: Secondary | ICD-10-CM

## 2016-01-28 DIAGNOSIS — K5900 Constipation, unspecified: Secondary | ICD-10-CM | POA: Diagnosis not present

## 2016-01-28 DIAGNOSIS — C770 Secondary and unspecified malignant neoplasm of lymph nodes of head, face and neck: Secondary | ICD-10-CM | POA: Diagnosis present

## 2016-01-28 DIAGNOSIS — T148XXA Other injury of unspecified body region, initial encounter: Secondary | ICD-10-CM

## 2016-01-28 DIAGNOSIS — Z85828 Personal history of other malignant neoplasm of skin: Secondary | ICD-10-CM

## 2016-01-28 LAB — COMPREHENSIVE METABOLIC PANEL
ALK PHOS: 100 U/L (ref 38–126)
ALT: 20 U/L (ref 17–63)
ANION GAP: 9 (ref 5–15)
AST: 18 U/L (ref 15–41)
Albumin: 3.5 g/dL (ref 3.5–5.0)
BILIRUBIN TOTAL: 0.3 mg/dL (ref 0.3–1.2)
BUN: 9 mg/dL (ref 6–20)
CALCIUM: 11.3 mg/dL — AB (ref 8.9–10.3)
CO2: 27 mmol/L (ref 22–32)
CREATININE: 0.74 mg/dL (ref 0.61–1.24)
Chloride: 102 mmol/L (ref 101–111)
GFR calc Af Amer: >60 mL/min (ref >60)
Glucose, Bld: 140 mg/dL — ABNORMAL HIGH (ref 65–99)
Potassium: 4 mmol/L (ref 3.5–5.1)
Sodium: 138 mmol/L (ref 135–145)
TOTAL PROTEIN: 8 g/dL (ref 6.5–8.1)

## 2016-01-28 LAB — CBC WITH DIFFERENTIAL/PLATELET
BASOS ABS: 0 10*3/uL (ref 0.0–0.1)
BASOS PCT: 0 %
EOS ABS: 0.2 10*3/uL (ref 0.0–0.7)
Eosinophils Relative: 1 %
HEMATOCRIT: 38.3 % — AB (ref 39.0–52.0)
HEMOGLOBIN: 12.3 g/dL — AB (ref 13.0–17.0)
Lymphocytes Relative: 16 %
Lymphs Abs: 2.7 10*3/uL (ref 0.7–4.0)
MCH: 26.9 pg (ref 26.0–34.0)
MCHC: 32.1 g/dL (ref 30.0–36.0)
MCV: 83.6 fL (ref 78.0–100.0)
MONOS PCT: 7 %
Monocytes Absolute: 1.2 10*3/uL — ABNORMAL HIGH (ref 0.1–1.0)
NEUTROS ABS: 12.9 10*3/uL — AB (ref 1.7–7.7)
Neutrophils Relative %: 76 %
Platelets: 344 10*3/uL (ref 150–400)
RBC: 4.58 MIL/uL (ref 4.22–5.81)
RDW: 14.4 % (ref 11.5–15.5)
WBC: 17.1 10*3/uL — ABNORMAL HIGH (ref 4.0–10.5)

## 2016-01-28 LAB — I-STAT CG4 LACTIC ACID, ED: LACTIC ACID, VENOUS: 1.28 mmol/L (ref 0.5–1.9)

## 2016-01-28 MED ORDER — ONDANSETRON HCL 4 MG/2ML IJ SOLN: 4.0000 mg | Freq: Four times a day (QID) | INTRAMUSCULAR | Status: DC | PRN

## 2016-01-28 MED ORDER — VANCOMYCIN HCL IN DEXTROSE 1-5 GM/200ML-% IV SOLN
1000.0000 mg | Freq: Once | INTRAVENOUS | Status: DC
  Filled 2016-01-28: qty 200

## 2016-01-28 MED ORDER — SODIUM CHLORIDE 0.9 % IV BOLUS (SEPSIS)
1000.0000 mL | Freq: Once | INTRAVENOUS | Status: AC
  Administered 2016-01-28: 1000 mL via INTRAVENOUS

## 2016-01-28 MED ORDER — VANCOMYCIN HCL IN DEXTROSE 1-5 GM/200ML-% IV SOLN
1000.0000 mg | Freq: Three times a day (TID) | INTRAVENOUS | Status: DC
  Administered 2016-01-29 – 2016-01-31 (×9): 1000 mg via INTRAVENOUS
  Filled 2016-01-28 (×11): qty 200

## 2016-01-28 MED ORDER — HEPARIN SODIUM (PORCINE) 5000 UNIT/ML IJ SOLN
5000.0000 [IU] | Freq: Three times a day (TID) | INTRAMUSCULAR | Status: DC
  Administered 2016-01-28 – 2016-02-01 (×9): 5000 [IU] via SUBCUTANEOUS
  Filled 2016-01-28 (×12): qty 1

## 2016-01-28 MED ORDER — HYDROMORPHONE HCL 2 MG/ML IJ SOLN
0.5000 mg | INTRAMUSCULAR | Status: DC | PRN
  Administered 2016-01-28 – 2016-02-01 (×21): 0.5 mg via INTRAVENOUS
  Filled 2016-01-28 (×21): qty 1

## 2016-01-28 MED ORDER — PIPERACILLIN-TAZOBACTAM 3.375 G IVPB 30 MIN
3.3750 g | Freq: Once | INTRAVENOUS | Status: AC
  Administered 2016-01-28: 3.375 g via INTRAVENOUS
  Filled 2016-01-28: qty 50

## 2016-01-28 MED ORDER — CEFAZOLIN IN D5W 1 GM/50ML IV SOLN
1.0000 g | Freq: Once | INTRAVENOUS | Status: AC
  Administered 2016-01-28: 1 g via INTRAVENOUS
  Filled 2016-01-28: qty 50

## 2016-01-28 MED ORDER — ONDANSETRON 4 MG PO TBDP: 4.0000 mg | ORAL_TABLET | Freq: Four times a day (QID) | ORAL | Status: DC | PRN

## 2016-01-28 MED ORDER — KCL IN DEXTROSE-NACL 20-5-0.45 MEQ/L-%-% IV SOLN
INTRAVENOUS | Status: DC
  Administered 2016-01-28 – 2016-02-02 (×6): via INTRAVENOUS
  Filled 2016-01-28 (×10): qty 1000

## 2016-01-28 MED ORDER — HYDROMORPHONE HCL 1 MG/ML IJ SOLN
1.0000 mg | Freq: Once | INTRAMUSCULAR | Status: AC
  Administered 2016-01-28: 1 mg via INTRAVENOUS
  Filled 2016-01-28: qty 1

## 2016-01-28 MED ORDER — PIPERACILLIN-TAZOBACTAM 3.375 G IVPB
3.3750 g | Freq: Three times a day (TID) | INTRAVENOUS | Status: DC
  Administered 2016-01-29 – 2016-02-02 (×13): 3.375 g via INTRAVENOUS
  Filled 2016-01-28 (×15): qty 50

## 2016-01-28 MED ORDER — VANCOMYCIN HCL IN DEXTROSE 1-5 GM/200ML-% IV SOLN
1000.0000 mg | Freq: Once | INTRAVENOUS | Status: AC
  Administered 2016-01-28: 1000 mg via INTRAVENOUS

## 2016-01-28 MED ORDER — OXYCODONE-ACETAMINOPHEN 5-325 MG PO TABS
1.0000 | ORAL_TABLET | ORAL | Status: DC | PRN
  Administered 2016-01-29 – 2016-02-02 (×10): 2 via ORAL
  Filled 2016-01-28 (×11): qty 2

## 2016-01-28 MED ORDER — NAPROXEN 500 MG PO TABS
500.0000 mg | ORAL_TABLET | Freq: Two times a day (BID) | ORAL | Status: DC | PRN
  Administered 2016-01-29: 500 mg via ORAL
  Filled 2016-01-28: qty 1

## 2016-01-28 NOTE — ED Provider Notes (Signed)
[facility] DEPT Provider Note   CSN: [id number] Arrival date & time: 01/28/16  1215     History   Chief Complaint Chief Complaint  Patient presents with  . Wound Infection    HPI Johnny Mooney is a 46 y.o. male.  The history is provided by the patient. No language interpreter was used.   Johnny Mooney is a 46 y.o. male who presents to the Emergency Department complaining of wound infection.  He had a lymph node biopsy performed by surgery in early December. He has experienced problems with 2 different infections requiring antibiotics. He continues to be on antibiotics (keflex) at home but over the last 2 days has experienced increased swelling, pain, drainage from the lower scalp wound. He does not report any fevers but is concerned for bacterial infection that needs IV antibiotics. He is scheduled tomorrow to initiate chemotherapy. Past Medical History:  Diagnosis Date  . Cancer ([place]) 11/2015   sq cell skin  . Cellulitis of left lower extremity    dx 09/ 2017 at ED took antibiotic per is healing  . History of traumatic head injury    closed -- no residual  . Subcutaneous mass of head    RIGHT POSTERIOR SCALP--  I&D at ED 09-16-2015 and 10-10-2015    Patient Active Problem List   Diagnosis Date Noted  . 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 Cellulitis 02/20/2012  . Tobacco abuse 02/20/2012    Past Surgical History:  Procedure Laterality Date  . EXCISION MASS HEAD N/A 11/06/2015   Procedure: EXCISION MASS HEAD;  Surgeon: Mickeal Skinner, MD;  Location: [facility];  Service: General;  Laterality: N/A;  . LYMPH NODE BIOPSY N/A 01/07/2016   Procedure: EXCISIONAL LYMPH NODE BIOPSY;  Surgeon: [name], MD;  Location: [facility];  Service: General;  Laterality: N/A;  . MASS EXCISION N/A 01/07/2016   Procedure: EXCISION OF SCALP MASS;  Surgeon: [name], MD;  Location: [facility];  Service: General;  Laterality: N/A;        Home Medications    Prior to Admission medications   Medication Sig Start Date End Date Taking? Authorizing Provider  doxycycline (VIBRA-TABS) 100 MG tablet TK 1 T PO BID 01/19/16  Yes Historical Provider, MD  ibuprofen (ADVIL,MOTRIN) 200 MG tablet Take 200-800 mg by mouth every 6 (six) hours as needed (pain).   Yes Historical Provider, MD  oxyCODONE (OXY IR/ROXICODONE) 5 MG immediate release tablet Take 5 mg by mouth every 4 (four) hours as needed for severe pain.   Yes Historical Provider, MD  cephALEXin (KEFLEX) 500 MG capsule Take 1 capsule (500 mg total) by mouth 3 (three) times daily. Patient not taking: Reported on 01/28/2016 01/15/16   Johnny Grosser, MD  HYDROcodone-acetaminophen (NORCO/VICODIN) 5-325 MG tablet Take 1 tablet by mouth every 6 (six) hours as needed for severe pain. Patient not taking: Reported on 01/28/2016 01/15/16   Johnny Grosser, MD  ibuprofen (ADVIL,MOTRIN) 800 MG tablet Take 1 tablet (800 mg total) by mouth every 8 (eight) hours as needed. Patient not taking: Reported on 01/28/2016 01/07/16   Mickeal Skinner, MD    Family History Family History  Problem Relation Age of Onset  . Diabetes Mellitus II Mother   . Asthma Father     Social History Social History  Substance Use Topics  . Smoking status: Current Every Day Smoker    Packs/day: 0.33    Years: 5.00    Types: Cigars  .  Smokeless tobacco: Never Used     Comment: quit cigarette smoking 2013, currently smokes 2 cigars daily  . Alcohol use 1.0 oz/week    2 Standard drinks or equivalent per week     Comment: couple of beers on the weekends     Allergies   Patient has no known allergies.   Review of Systems Review of Systems  All other systems reviewed and are negative.    Physical Exam Updated Vital Signs BP 111/73 (BP Location: Left Arm)   Pulse 106   Temp 100 F (37.8 C) (Oral)   Resp 12   Ht 5\' 11"  (1.803 m)   Wt 173 lb 7 oz (78.7 kg)   SpO2 97%   BMI 24.19 kg/m    Physical Exam  Constitutional: He is oriented to person, place, and time. He appears well-developed and well-nourished.  HENT:  Head: Normocephalic and atraumatic.  Neck:  Posterior scalp with 1 cm wound with granulation tissue in the base, minimal local swelling and tenderness. Large wound on the right posterior neck with exudates in the base and significant surrounding induration, erythema and tenderness. Induration tracks to the right lateral and anterior neck. Right TM is clear. No stridor.  Cardiovascular: Regular rhythm.   No murmur heard. Tachycardic  Pulmonary/Chest: Effort normal and breath sounds normal. No respiratory distress.  Abdominal: Soft. There is no tenderness. There is no rebound and no guarding.  Musculoskeletal: He exhibits no edema or tenderness.  Neurological: He is alert and oriented to person, place, and time.  Skin: Skin is warm and dry.  Psychiatric: He has a normal mood and affect. His behavior is normal.  Nursing note and vitals reviewed.    ED Treatments / Results  Labs (all labs ordered are listed, but only abnormal results are displayed) Labs Reviewed  COMPREHENSIVE METABOLIC PANEL  CBC WITH DIFFERENTIAL/PLATELET  I-STAT CG4 LACTIC ACID, ED    EKG  EKG Interpretation None       Radiology No results found.  Procedures Procedures (including critical care time)  Medications Ordered in ED Medications  sodium chloride 0.9 % bolus 1,000 mL (not administered)     Initial Impression / Assessment and Plan / ED Course  I have reviewed the triage vital signs and the nursing notes.  Pertinent labs & imaging results that were available during my care of the patient were reviewed by me and considered in my medical decision making (see chart for details).  Clinical Course     Pt here for poorly healing wound s/p surgery in early December with pathology c/w SCC. Pt is nontoxic on examination.  Wound appears significantly worse when compared  to images in Epic from 12/22.  Will initiate antibiotics for potential bacterial infection.  General Surgery consulted for evaluation and further treatment.    Final Clinical Impressions(s) / ED Diagnoses   Final diagnoses:  None    New Prescriptions New Prescriptions   No medications on file     Quintella Reichert, MD 01/29/16 [phone]

## 2016-01-28 NOTE — Progress Notes (Signed)
Johnny Mooney is a 46 y.o. male patient admitted from ED awake, alert - oriented  X 4 - no acute distress noted.  VSS - Blood pressure 137/75, pulse 97, temperature 99.8 F (37.7 C), temperature source Oral, resp. rate 18, height 5\' 11"  (1.803 m), weight 77.7 kg (171 lb 6.4 oz), SpO2 100 %.    IV in place, occlusive dsg intact without redness.  Orientation to room, and floor completed with information packet given to patient/family.  Patient declined safety video at this time.  Admission INP armband ID verified with patient/family, and in place.   SR up x 2, fall assessment complete, with patient and family able to verbalize understanding of risk associated with falls, and verbalized understanding to call nsg before up out of bed.  Call light within reach, patient able to voice, and demonstrate understanding.  Dressing clean-dry- intact. No evidence of skin break down noted on exam.    Will cont to eval and treat per MD orders.  Marcy Salvo, RN 01/28/2016 11:25 PM

## 2016-01-28 NOTE — H&P (Addendum)
Re:   Johnny Mooney DOB:   November 22, 1970 MRN:   [id number]   [facility] Admission Note  ASSESSMENT AND PLAN: 1.  Localized cellulitis/swelling in the wound of right posterior neck.  [Photo in PE]  Plan:  Admission, place on IV antibiotics, repeat labs in AM, discuss with Dr. Kieth Brightly about further management.  I will make him NPO past midnight in case Dr. Kieth Brightly is considering surgery.  2.  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 Wound Infection   PHYSICIAN REQUESTING CONSULTATION:  Dr. Quintella Reichert, [facility] ER  HISTORY OF PRESENT ILLNESS: Johnny Mooney is a 46 y.o. (DOB: 01-Nov-1970)  AA male whose primary care physician is No PCP Per Patient. He comes to the [facility] with worsening pain/swelling/infection in a posterior cervical wound. His girlfriend, [name], is at the bedside. He is somewhat difficult to talk to in that he does not answer questions straightforward.   He had an excision of a scalp lesion on 11/06/2015 by Dr. Kieth Brightly.which proved to be SCCa.  He then underwent a re-excision of a scalp wound and biopsy of two right posterior cervical nodes on 01/07/2016 by Dr. Kieth Brightly.   It looks like he called and spoke with Dr. Johney Maine last PM.  He called our office today and was told to go to the ER.  He thinks that the neck wound has become much worse over the last 48 hours - with increasing tenderness and swelling.  He saw Dr. Zella Richer in our office last week (12/29) and is on doxycycline.  Prior to that, he was on oral Keflex.   Per Epic - he had an appt with Dr. Constance Holster on 01/21/2016.  He has an appt with [name] on 02/03/2016.  He is for a PET scan on 01/29/2016.   Per the patient (if I have this correctly), he has not seen Dr. Constance Holster (?ENT?).  He did not know about the PET scan for  tomorrow (1/5).  He was aware that he was to see [name] - but did not understand whether he was looking at chemotx or rad tx.   Past Medical History:  Diagnosis Date  . Cancer ([place]) 11/2015   sq cell skin  . Cellulitis of left lower extremity    dx 09/ 2017 at ED took antibiotic per is healing  . History of traumatic head injury    closed -- no residual  . Subcutaneous mass of head    RIGHT POSTERIOR SCALP--  I&D at ED 09-16-2015 and 10-10-2015      Past Surgical History:  Procedure Laterality Date  . EXCISION MASS HEAD N/A 11/06/2015   Procedure: EXCISION MASS HEAD;  Surgeon: Mickeal Skinner, MD;  Location: [facility];  Service: General;  Laterality: N/A;  . LYMPH NODE BIOPSY N/A 01/07/2016   Procedure: EXCISIONAL LYMPH NODE BIOPSY;  Surgeon: [name], MD;  Location[facility];  Service: General;  Laterality: N/A;  . MASS EXCISION N/A 01/07/2016   Procedure: EXCISION OF SCALP MASS;  Surgeon: [name], MD;  Location: [facility];  Service: General;  Laterality: N/A;      Current Facility-Administered Medications  Medication Dose Route Frequency Provider Last Rate Last Dose  . ceFAZolin (ANCEF) IVPB 1 g/50 mL premix  1 g Intravenous Once Benjamine Mola  Ralene Bathe, MD 100 mL/hr at 01/28/16 1841 1 g at 01/28/16 1841  . vancomycin (VANCOCIN) IVPB 1000 mg/200 mL premix  1,000 mg Intravenous Once Quintella Reichert, MD       Current Outpatient Prescriptions  Medication Sig Dispense Refill  . doxycycline (VIBRA-TABS) 100 MG tablet TK 1 T PO BID  0  . ibuprofen (ADVIL,MOTRIN) 200 MG tablet Take 200-800 mg by mouth every 6 (six) hours as needed (pain).    Marland Kitchen oxyCODONE (OXY IR/ROXICODONE) 5 MG immediate release tablet Take 5 mg by mouth every 4 (four) hours as needed for severe pain.    . cephALEXin (KEFLEX) 500 MG capsule Take 1 capsule (500 mg total) by mouth 3 (three) times daily. (Patient not taking: Reported on 01/28/2016) 30 capsule 0  .  HYDROcodone-acetaminophen (NORCO/VICODIN) 5-325 MG tablet Take 1 tablet by mouth every 6 (six) hours as needed for severe pain. (Patient not taking: Reported on 01/28/2016) 6 tablet 0  . ibuprofen (ADVIL,MOTRIN) 800 MG tablet Take 1 tablet (800 mg total) by mouth every 8 (eight) hours as needed. (Patient not taking: Reported on 01/28/2016) 30 tablet 0     No Known Allergies  REVIEW OF SYSTEMS: Skin:  See HPI Infection:  See HPI Neurologic:  No history of stroke.  No history of seizure.  No history of headaches. Cardiac:  No history of hypertension. No history of heart disease.   Pulmonary:  Does not smoke cigarettes.   No OSA/CPAP.  Endocrine:  No diabetes. No thyroid disease. Gastrointestinal:  No history of stomach disease.  No history of liver disease.   No history of colon disease. Urologic:  No history of kidney stones.  No history of bladder infections. Musculoskeletal:  No history of joint or back disease. Hematologic:  No bleeding disorder.  No history of anemia.  Not anticoagulated. Psycho-social:  The patient is oriented.   The patient has no obvious psychologic or social impairment to understanding our conversation and plan.  SOCIAL and FAMILY HISTORY: He worked for [employer] in [place] up until October 2017.  He has been out of work since he had the surgery. His girlfriend is Financial trader.  PHYSICAL EXAM: BP 101/67 (BP Location: Left Arm)   Pulse 97   Temp 100 F (37.8 C) (Oral)   Resp 16   Ht 5\' 11"  (1.803 m)   Wt 78.7 kg (173 lb 7 oz)   SpO2 100%   BMI 24.19 kg/m   General: WN AA M who is alert.   He apparently got some Dilaudid before I saw him. Skin:  Inspection and palpation - no mass or rash. Eyes:  Conjunctiva and lids unremarkable.            Pupils are equal Ears, Nose, Mouth, and Throat:  Ears and nose unremarkable            Lips and teeth are unremarable. Neck: Open scalp wound posteriorly that looks okay.  Still has sutures.       Open neck wound - approx 4 x  8 cm with swelling  Lungs: Normal respiratory effort.  Clear to auscultation and symmetric breath sounds. Heart:  Palpation of the heart is normal.            Auscultation: RRR. No murmur or rub.  Abdomen: Soft. No mass. No tenderness. No hernia.             Normal bowel sounds.  No abdominal scars.  Musculoskeletal: Normal strength and ROM  in upper and  lower extremities. Neurologic:  Grossly intact to motor and sensory function. Psychiatric: Normal judgement and insight. Behavior is normal.   Neck wound - 01/28/2015  DATA REVIEWED, COUNSELING AND COORDINATION OF CARE: Epic notes reviewed. Counseling and coordination of care exceeded more than 50% of the time spent with patient. Total time spent with patient and charting: 45 minutes  Alphonsa Overall, MD,  [facility], [address]  [place], [place]    [place] Phone:  [phone] FAX:  [phone]

## 2016-01-28 NOTE — ED Notes (Addendum)
Unable to obtain lactic acid results for pt, the first and second samples were bad samples that needed to be recollected. Staff went to stick the pt a third time and was unsuccessful. The pt request to not be stuck for any more labs. RN made aware.

## 2016-01-28 NOTE — ED Triage Notes (Signed)
Pt reports suture site for skin cancer biopsy on back of neck opened up 2 days ago. Has had drainage and pain at site. Not on chemo yet, was scheduled to start tomorrow.

## 2016-01-28 NOTE — ED Notes (Signed)
Pt had a lymph node biopsy a month ago and was given and did not work. Pt was referred to [facility] who also gave antibiotics.  Wound ahs gotten much worse and is oozing pus and very painful.  Pt requesting IV abx.

## 2016-01-29 ENCOUNTER — Telehealth: Payer: Self-pay | Admitting: *Deleted

## 2016-01-29 ENCOUNTER — Ambulatory Visit: Payer: Self-pay | Attending: Radiation Oncology

## 2016-01-29 ENCOUNTER — Encounter (HOSPITAL_COMMUNITY): Payer: Self-pay

## 2016-01-29 DIAGNOSIS — Z85828 Personal history of other malignant neoplasm of skin: Secondary | ICD-10-CM | POA: Diagnosis not present

## 2016-01-29 DIAGNOSIS — K5900 Constipation, unspecified: Secondary | ICD-10-CM | POA: Diagnosis not present

## 2016-01-29 DIAGNOSIS — C770 Secondary and unspecified malignant neoplasm of lymph nodes of head, face and neck: Secondary | ICD-10-CM | POA: Diagnosis present

## 2016-01-29 DIAGNOSIS — Z825 Family history of asthma and other chronic lower respiratory diseases: Secondary | ICD-10-CM | POA: Diagnosis not present

## 2016-01-29 DIAGNOSIS — Z833 Family history of diabetes mellitus: Secondary | ICD-10-CM | POA: Diagnosis not present

## 2016-01-29 DIAGNOSIS — T814XXA Infection following a procedure, initial encounter: Secondary | ICD-10-CM | POA: Diagnosis not present

## 2016-01-29 DIAGNOSIS — F17290 Nicotine dependence, other tobacco product, uncomplicated: Secondary | ICD-10-CM | POA: Diagnosis present

## 2016-01-29 LAB — CBC WITH DIFFERENTIAL/PLATELET
BASOS PCT: 0 %
Basophils Absolute: 0 10*3/uL (ref 0.0–0.1)
EOS ABS: 0.2 10*3/uL (ref 0.0–0.7)
EOS PCT: 2 %
HCT: 33.2 % — ABNORMAL LOW (ref 39.0–52.0)
HEMOGLOBIN: 10.7 g/dL — AB (ref 13.0–17.0)
LYMPHS ABS: 2.5 10*3/uL (ref 0.7–4.0)
Lymphocytes Relative: 17 %
MCH: 27.1 pg (ref 26.0–34.0)
MCHC: 32.2 g/dL (ref 30.0–36.0)
MCV: 84.1 fL (ref 78.0–100.0)
MONO ABS: 1.3 10*3/uL — AB (ref 0.1–1.0)
MONOS PCT: 9 %
NEUTROS PCT: 72 %
Neutro Abs: 10.7 10*3/uL — ABNORMAL HIGH (ref 1.7–7.7)
Platelets: 298 10*3/uL (ref 150–400)
RBC: 3.95 MIL/uL — ABNORMAL LOW (ref 4.22–5.81)
RDW: 14.3 % (ref 11.5–15.5)
WBC: 14.7 10*3/uL — ABNORMAL HIGH (ref 4.0–10.5)

## 2016-01-29 LAB — VANCOMYCIN, TROUGH: VANCOMYCIN TR: 15 ug/mL (ref 15–20)

## 2016-01-29 MED ORDER — PREMIER PROTEIN SHAKE
11.0000 [oz_av] | Freq: Two times a day (BID) | ORAL | Status: DC
  Administered 2016-01-29 – 2016-02-01 (×5): 11 [oz_av] via ORAL

## 2016-01-29 NOTE — Progress Notes (Signed)
Pharmacy Antibiotic Note  Johnny Mooney is a 46 y.o. male admitted on 01/28/2016 with cellulitis of scalp and neck surgical wounds.  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   Pharmacy has been consulted for vancomycin and Zosyn dosing.  Day #2 vancomycin 1g IV q8h and Zosyn 3.375g IV q8h (4 hour infusion time).   Plan: Check vancomycin trough tonight (goal 10-15 mcg/ml). Continue Zosyn 3.375g IV q8h (4 hour infusion time).   Height: 5\' 11"  (180.3 cm) Weight: 171 lb 6.4 oz (77.7 kg) IBW/kg (Calculated) : 75.3  Temp (24hrs), Avg:99.9 F (37.7 C), Min:99.1 F (37.3 C), Max:101.2 F (38.4 C)   Recent Labs Lab 01/28/16 1646 01/28/16 1839 01/29/16 0505  WBC 17.1*  --  14.7*  CREATININE 0.74  --   --   LATICACIDVEN  --  1.28  --     Estimated Creatinine Clearance: 126.8 mL/min (by C-G formula based on SCr of 0.74 mg/dL).    No Known Allergies  Antimicrobials this admission:  1/4 Vanc >> 1/4 Zosyn >>  Dose adjustments this admission:  1/5 2100 VT: ____ on 1g q8h  Microbiology results:  1/5 BCx: sent  Thank you for allowing pharmacy to be a part of this patient's care.  Hershal Coria 01/29/2016 2:15 PM

## 2016-01-29 NOTE — Progress Notes (Signed)
Progress Note: General Surgery Service   Subjective: Pain improved. Patient also notes hearing sensitivity and fibrinous tissue around molar teeth  Objective: Vital signs in last 24 hours: Temp:  [99.1 F (37.3 C)-101.2 F (38.4 C)] 99.3 F (37.4 C) (01/05 1400) Pulse Rate:  [77-103] 83 (01/05 1400) Resp:  [15-18] 18 (01/05 1400) BP: (101-137)/(54-75) 110/68 (01/05 1400) SpO2:  [97 %-100 %] 100 % (01/05 1400) Weight:  [77.7 kg (171 lb 6.4 oz)] 77.7 kg (171 lb 6.4 oz) (01/04 2238)    Intake/Output from previous day: 01/04 0701 - 01/05 0700 In: 1828.8 [I.V.:478.8; IV Piggyback:1350] Out: -  Intake/Output this shift: Total I/O In: 600 [I.V.:600] Out: 0   Skin: R posterior neck incision open with fibrinous debris at base.  Neuro: AOx4  Lab Results: CBC   Recent Labs  01/28/16 1646 01/29/16 0505  WBC 17.1* 14.7*  HGB 12.3* 10.7*  HCT 38.3* 33.2*  PLT 344 298   BMET  Recent Labs  01/28/16 1646  NA 138  K 4.0  CL 102  CO2 27  GLUCOSE 140*  BUN 9  CREATININE 0.74  CALCIUM 11.3*   PT/INR No results for input(s): LABPROT, INR in the last 72 hours. ABG No results for input(s): PHART, HCO3 in the last 72 hours.  Invalid input(s): PCO2, PO2  Studies/Results:  Anti-infectives: Anti-infectives    Start     Dose/Rate Route Frequency Ordered Stop   01/29/16 0600  vancomycin (VANCOCIN) IVPB 1000 mg/200 mL premix     1,000 mg 200 mL/hr over 60 Minutes Intravenous Every 8 hours 01/28/16 2043     01/29/16 0600  piperacillin-tazobactam (ZOSYN) IVPB 3.375 g     3.375 g 12.5 mL/hr over 240 Minutes Intravenous Every 8 hours 01/28/16 2043     01/28/16 2045  vancomycin (VANCOCIN) IVPB 1000 mg/200 mL premix     1,000 mg 200 mL/hr over 60 Minutes Intravenous  Once 01/28/16 2043 01/28/16 2225   01/28/16 2045  piperacillin-tazobactam (ZOSYN) IVPB 3.375 g     3.375 g 100 mL/hr over 30 Minutes Intravenous  Once 01/28/16 2043 01/28/16 2120   01/28/16 1730  ceFAZolin  (ANCEF) IVPB 1 g/50 mL premix     1 g 100 mL/hr over 30 Minutes Intravenous  Once 01/28/16 1720 01/28/16 1955   01/28/16 1730  vancomycin (VANCOCIN) IVPB 1000 mg/200 mL premix  Status:  Discontinued     1,000 mg 200 mL/hr over 60 Minutes Intravenous  Once 01/28/16 1720 01/28/16 2041      Medications: Scheduled Meds: . heparin  5,000 Units Subcutaneous Q8H  . piperacillin-tazobactam (ZOSYN)  IV  3.375 g Intravenous Q8H  . protein supplement shake  11 oz Oral BID BM  . vancomycin  1,000 mg Intravenous Q8H   Continuous Infusions: . dextrose 5 % and 0.45 % NaCl with KCl 20 mEq/L 75 mL/hr at 01/29/16 0600   PRN Meds:.HYDROmorphone (DILAUDID) injection, naproxen, ondansetron **OR** ondansetron (ZOFRAN) IV, oxyCODONE-acetaminophen  Assessment/Plan: Patient Active Problem List   Diagnosis Date Noted  . Neck infection 01/28/2016  . 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 Cellulitis 02/20/2012  . Tobacco abuse 02/20/2012   s/p  Neck LN biopsy and SCC cancer removed. Spoke with H+N nurse navigator who will speak with him. -IV abx for weekend -continue dressing changes -if incision doesn't improve may require washout next week -ok for diet     LOS: 0 days   Mickeal Skinner, MD Pg# [phone] [facility]

## 2016-01-29 NOTE — Progress Notes (Signed)
Notified Dr. Lucia Gaskins of pt temp of 101.2, BP 102/58, pulse 103. Received verbal orders for CBC with differential.

## 2016-01-29 NOTE — Progress Notes (Signed)
Initial Nutrition Assessment  DOCUMENTATION CODES:   Not applicable  INTERVENTION:   Premier Protein- BID which provides 160kcal and 30g protein per serving   snacks  NUTRITION DIAGNOSIS:   Inadequate oral intake related to inability to eat as evidenced by NPO status.  GOAL:   Patient will meet greater than or equal to 90% of their needs  MONITOR:   PO intake, Supplement acceptance  REASON FOR ASSESSMENT:   Malnutrition Screening Tool    ASSESSMENT:   46 y/o male presenting for wound infection and cellulitis s/p excision of a scalp lesion on 11/06/2015 by Dr. Kieth Brightly.which proved to be SCCa.  He then underwent a re-excision of a scalp wound and biopsy of two right posterior cervical nodes on 01/07/2016 by Dr. Kieth Brightly.   Met with pt in room today. Pt agitated because he was NPO. Pt reports good appetite pta and good appetite today. Pt is wanting to eat but was NPO r/t possible surgery. Pt has lost 7lbs(4%) in 2 weeks. This is significant. Pt willing to try premier protein. Pt just advanced to regular diet today. Will go ahead and order supplements.   Medications reviewed and include: heparin, zosyn, vancomycin, hydromorphone, naproxen  Labs reviewed: Ca 11.3(H) VAN-19.7(H)  Nutrition-Focused physical exam completed. Findings are no fat depletion, no muscle depletion, and no edema.   Diet Order:  Diet regular Room service appropriate? Yes; Fluid consistency: Thin  Skin:  Wound (see comment) (incision neck and head)  Last BM:  none since admit  Height:   Ht Readings from Last 1 Encounters:  01/28/16 5' 11"  (1.803 m)    Weight:   Wt Readings from Last 1 Encounters:  01/28/16 171 lb 6.4 oz (77.7 kg)    Ideal Body Weight:  78.1 kg  BMI:  Body mass index is 23.91 kg/m.  Estimated Nutritional Needs:   Kcal:  2000-2400kcal/day   Protein:  86-101g/day   Fluid:  2L/day   EDUCATION NEEDS:   No education needs identified at this time  Koleen Distance, RD, LDN Pager #[phone]

## 2016-01-29 NOTE — Progress Notes (Signed)
Brief Pharmacy Antibiotic Note  Johnny Mooney is a 46 y.o. male admitted on 01/28/2016 with cellulitis of scalp and neck surgical wounds on day #2 of vancomycin and Zosyn.  Plan: Continue Zosyn 3.375g IV Q8H infused over 4hrs. Continue Vancomycin 1g IV q8h. Recheck Vanc trough as needed. Goal VT 10-15.  Follow up renal fxn, culture results, and clinical course.   Height: 5\' 11"  (180.3 cm) Weight: 171 lb 6.4 oz (77.7 kg) IBW/kg (Calculated) : 75.3  Temp (24hrs), Avg:99.7 F (37.6 C), Min:98.8 F (37.1 C), Max:101.2 F (38.4 C)   Recent Labs Lab 01/28/16 1646 01/28/16 1839 01/29/16 0505  WBC 17.1*  --  14.7*  CREATININE 0.74  --   --   LATICACIDVEN  --  1.28  --     Estimated Creatinine Clearance: 126.8 mL/min (by C-G formula based on SCr of 0.74 mg/dL).    No Known Allergies  Antimicrobials this admission:  1/4 Vanc >> 1/4 Zosyn >>  Dose adjustments this admission:  1/5 2100 VT: 15 on 1g q8h prior to the 4th dose, upper end of goal range.  Microbiology results:  1/5 BCx: ngtd  Thank you for allowing pharmacy to be a part of this patient's care.  Gretta Arab PharmD, BCPS Pager [phone] 01/29/2016 9:02 PM

## 2016-01-29 NOTE — Telephone Encounter (Signed)
Oncology Nurse Navigator Documentation  In context of 1/11 consult appt, LVM for ENT Dr. Constance Holster notifying him of [facility]. [facility] admission [facility] [phone].  Gayleen Orem, RN, BSN, [place] Oncology Nurse [place] at [place] [phone]

## 2016-01-30 LAB — CBC
HEMATOCRIT: 32.8 % — AB (ref 39.0–52.0)
HEMOGLOBIN: 10.8 g/dL — AB (ref 13.0–17.0)
MCH: 27.4 pg (ref 26.0–34.0)
MCHC: 32.9 g/dL (ref 30.0–36.0)
MCV: 83.2 fL (ref 78.0–100.0)
Platelets: 332 10*3/uL (ref 150–400)
RBC: 3.94 MIL/uL — AB (ref 4.22–5.81)
RDW: 14.1 % (ref 11.5–15.5)
WBC: 13.3 10*3/uL — ABNORMAL HIGH (ref 4.0–10.5)

## 2016-01-30 NOTE — Progress Notes (Signed)
  General Surgery [facility]  Assessment & Plan:  Squamous cell carcinoma Wound infection, cellulitis  IV Zosyn, Vanco  Pain Rx  Dressing changes  Dr. Kieth Brightly to assess on Monday.  Arrangements for consultation with Rad Onc and ENT ongoing.        [name], MD, [facility]       Office: [phone]    Subjective: Patient in bed, request pain Rx.  States wound feels better.  Dressing changed last night.  Objective: Vital signs in last 24 hours: Temp:  [98.8 F (37.1 C)-99.9 F (37.7 C)] 99.7 F (37.6 C) (01/06 0617) Pulse Rate:  [72-92] 92 (01/06 0617) Resp:  [17-18] 18 (01/06 0617) BP: (104-116)/(54-72) 104/61 (01/06 0617) SpO2:  [97 %-100 %] 99 % (01/06 0617) Weight:  [102.6 kg (226 lb 3.2 oz)] 102.6 kg (226 lb 3.2 oz) (01/06 0630)    Intake/Output from previous day: 01/05 0701 - 01/06 0700 In: 2280 [P.O.:480; I.V.:1800] Out: 0  Intake/Output this shift: No intake/output data recorded.  Physical Exam: HEENT - sclerae clear, mucous membranes moist Neck - dressing dry and intact right posterior neck and occipital scalp  Lab Results:   Recent Labs  01/29/16 0505 01/30/16 0531  WBC 14.7* 13.3*  HGB 10.7* 10.8*  HCT 33.2* 32.8*  PLT 298 332   BMET  Recent Labs  01/28/16 1646  NA 138  K 4.0  CL 102  CO2 27  GLUCOSE 140*  BUN 9  CREATININE 0.74  CALCIUM 11.3*   PT/INR No results for input(s): LABPROT, INR in the last 72 hours. Comprehensive Metabolic Panel:    Component Value Date/Time   NA 138 01/28/2016 1646   NA 140 10/05/2015 2354   K 4.0 01/28/2016 1646   K 4.2 10/05/2015 2354   CL 102 01/28/2016 1646   CL 105 10/05/2015 2354   CO2 27 01/28/2016 1646   CO2 27 10/05/2015 2354   BUN 9 01/28/2016 1646   BUN 11 10/05/2015 2354   CREATININE 0.74 01/28/2016 1646   CREATININE 0.90 10/05/2015 2354   GLUCOSE 140 (H) 01/28/2016 1646   GLUCOSE 101 (H) 10/05/2015 2354   CALCIUM  11.3 (H) 01/28/2016 1646   CALCIUM 8.8 (L) 10/05/2015 2354   AST 18 01/28/2016 1646   ALT 20 01/28/2016 1646   ALKPHOS 100 01/28/2016 1646   BILITOT 0.3 01/28/2016 1646   PROT 8.0 01/28/2016 1646   ALBUMIN 3.5 01/28/2016 1646    Studies/Results: No results found.    Johnny Mooney M 01/30/2016  Patient ID: Johnny Mooney, male   DOB: 01/11/1971, 46 y.o.   MRN: [id number]

## 2016-01-31 LAB — BASIC METABOLIC PANEL
ANION GAP: 8 (ref 5–15)
BUN: <5 mg/dL — ABNORMAL LOW (ref 6–20)
CHLORIDE: 100 mmol/L — AB (ref 101–111)
CO2: 26 mmol/L (ref 22–32)
Calcium: 11.3 mg/dL — ABNORMAL HIGH (ref 8.9–10.3)
Creatinine, Ser: 0.83 mg/dL (ref 0.61–1.24)
Glucose, Bld: 124 mg/dL — ABNORMAL HIGH (ref 65–99)
POTASSIUM: 4.2 mmol/L (ref 3.5–5.1)
SODIUM: 134 mmol/L — AB (ref 135–145)

## 2016-01-31 LAB — CBC
HCT: 35.5 % — ABNORMAL LOW (ref 39.0–52.0)
HEMOGLOBIN: 11.6 g/dL — AB (ref 13.0–17.0)
MCH: 27.2 pg (ref 26.0–34.0)
MCHC: 32.7 g/dL (ref 30.0–36.0)
MCV: 83.1 fL (ref 78.0–100.0)
Platelets: 327 10*3/uL (ref 150–400)
RBC: 4.27 MIL/uL (ref 4.22–5.81)
RDW: 14.3 % (ref 11.5–15.5)
WBC: 15.7 10*3/uL — AB (ref 4.0–10.5)

## 2016-01-31 MED ORDER — ALUM & MAG HYDROXIDE-SIMETH 200-200-20 MG/5ML PO SUSP
30.0000 mL | ORAL | Status: DC | PRN
  Administered 2016-02-01: 30 mL via ORAL
  Filled 2016-01-31: qty 30

## 2016-01-31 NOTE — Progress Notes (Signed)
  General Surgery [facility]  Assessment & Plan:  Squamous cell carcinoma Wound infection, cellulitis             IV Zosyn, Vanco  WBC slight up to 15K             Pain Rx             Dressing changes  Dr. Kieth Brightly to assess on Monday.  Arrangements for consultation with Rad Onc and ENT ongoing.         [name], MD, [facility]       Office: [phone]    Subjective: Patient up at bedside, sleeping family member in room.  Seen with nurse.  Dressing dry and intact - changed at 4AM today.  Complains of heartburn symptoms, mild constipation.  Objective: Vital signs in last 24 hours: Temp:  [98.4 F (36.9 C)-100.1 F (37.8 C)] 100.1 F (37.8 C) (01/07 0534) Pulse Rate:  [85-96] 96 (01/07 0534) Resp:  [18] 18 (01/07 0534) BP: (103-127)/(58-89) 103/58 (01/07 0534) SpO2:  [98 %-100 %] 98 % (01/07 0534) Last BM Date: 01/30/16  Intake/Output from previous day: 01/06 0701 - 01/07 0700 In: 3657.5 [P.O.:600; I.V.:1557.5; IV Piggyback:1500] Out: 0  Intake/Output this shift: No intake/output data recorded.  Physical Exam: HEENT - sclerae clear, mucous membranes moist Neck - dressing posterior neck and scalp dry and intact; complains of pain with light touch  Lab Results:   Recent Labs  01/30/16 0531 01/31/16 0523  WBC 13.3* 15.7*  HGB 10.8* 11.6*  HCT 32.8* 35.5*  PLT 332 327   BMET  Recent Labs  01/28/16 1646 01/31/16 0523  NA 138 134*  K 4.0 4.2  CL 102 100*  CO2 27 26  GLUCOSE 140* 124*  BUN 9 <5*  CREATININE 0.74 0.83  CALCIUM 11.3* 11.3*   PT/INR No results for input(s): LABPROT, INR in the last 72 hours. Comprehensive Metabolic Panel:    Component Value Date/Time   NA 134 (L) 01/31/2016 0523   NA 138 01/28/2016 1646   K 4.2 01/31/2016 0523   K 4.0 01/28/2016 1646   CL 100 (L) 01/31/2016 0523   CL 102 01/28/2016 1646   CO2 26 01/31/2016 0523   CO2 27 01/28/2016 1646   BUN <5 (L)  01/31/2016 0523   BUN 9 01/28/2016 1646   CREATININE 0.83 01/31/2016 0523   CREATININE 0.74 01/28/2016 1646   GLUCOSE 124 (H) 01/31/2016 0523   GLUCOSE 140 (H) 01/28/2016 1646   CALCIUM 11.3 (H) 01/31/2016 0523   CALCIUM 11.3 (H) 01/28/2016 1646   AST 18 01/28/2016 1646   ALT 20 01/28/2016 1646   ALKPHOS 100 01/28/2016 1646   BILITOT 0.3 01/28/2016 1646   PROT 8.0 01/28/2016 1646   ALBUMIN 3.5 01/28/2016 1646    Studies/Results: No results found.    Vasiliki Smaldone M 01/31/2016  Patient ID: Johnny Mooney, male   DOB: [birth year], 46 y.o.   MRN: [id number]

## 2016-02-01 ENCOUNTER — Ambulatory Visit (HOSPITAL_COMMUNITY): Admission: RE | Admit: 2016-02-01 | Payer: Self-pay | Source: Ambulatory Visit

## 2016-02-01 ENCOUNTER — Encounter (HOSPITAL_COMMUNITY): Payer: Self-pay

## 2016-02-01 ENCOUNTER — Encounter: Payer: Self-pay | Admitting: *Deleted

## 2016-02-01 LAB — CBC
HCT: 36.3 % — ABNORMAL LOW (ref 39.0–52.0)
Hemoglobin: 11.9 g/dL — ABNORMAL LOW (ref 13.0–17.0)
MCH: 27.5 pg (ref 26.0–34.0)
MCHC: 32.8 g/dL (ref 30.0–36.0)
MCV: 83.8 fL (ref 78.0–100.0)
PLATELETS: 335 10*3/uL (ref 150–400)
RBC: 4.33 MIL/uL (ref 4.22–5.81)
RDW: 14.1 % (ref 11.5–15.5)
WBC: 17.3 10*3/uL — AB (ref 4.0–10.5)

## 2016-02-01 LAB — VANCOMYCIN, TROUGH: VANCOMYCIN TR: 19 ug/mL (ref 15–20)

## 2016-02-01 MED ORDER — VANCOMYCIN HCL IN DEXTROSE 1-5 GM/200ML-% IV SOLN
1000.0000 mg | Freq: Two times a day (BID) | INTRAVENOUS | Status: DC
  Administered 2016-02-01 – 2016-02-02 (×3): 1000 mg via INTRAVENOUS
  Filled 2016-02-01 (×4): qty 200

## 2016-02-01 NOTE — Progress Notes (Signed)
Oncology Nurse Navigator Documentation  Visited Mr. Johnny Mooney [id number] to check on his well-being.  He was sleeping.  Provide NT my card, asked her to have patient call me when he was available for a visit.  Gayleen Orem, RN, BSN, [place] Oncology Nurse [place] at [place] [phone]

## 2016-02-01 NOTE — Progress Notes (Signed)
Progress Note: General Surgery Service   Subjective: Pain improved, eating well  Objective: Vital signs in last 24 hours: Temp:  [99.1 F (37.3 C)-100 F (37.8 C)] 99.1 F (37.3 C) (01/08 0551) Pulse Rate:  [91-96] 96 (01/08 0551) Resp:  [16-18] 18 (01/08 0551) BP: (98-126)/(58-78) 126/71 (01/08 0551) SpO2:  [96 %-100 %] 96 % (01/08 0551) Last BM Date: 01/30/16  Intake/Output from previous day: 01/07 0701 - 01/08 0700 In: 3317 [P.O.:1017; I.V.:1800; IV Piggyback:500] Out: 0  Intake/Output this shift: Total I/O In: 240 [P.O.:240] Out: -   Lungs: CTAB  Cardiovascular: RRR  Abd: soft, NT, ND  Extremities: no edema  Neck: posterior neck open with fibrinous debri but purulent drainage, inflamed with some granulation tissue present.  Lab Results: CBC   Recent Labs  01/31/16 0523 02/01/16 0535  WBC 15.7* 17.3*  HGB 11.6* 11.9*  HCT 35.5* 36.3*  PLT 327 335   BMET  Recent Labs  01/31/16 0523  NA 134*  K 4.2  CL 100*  CO2 26  GLUCOSE 124*  BUN <5*  CREATININE 0.83  CALCIUM 11.3*   PT/INR No results for input(s): LABPROT, INR in the last 72 hours. ABG No results for input(s): PHART, HCO3 in the last 72 hours.  Invalid input(s): PCO2, PO2  Studies/Results:  Anti-infectives: Anti-infectives    Start     Dose/Rate Route Frequency Ordered Stop   02/01/16 0800  vancomycin (VANCOCIN) IVPB 1000 mg/200 mL premix     1,000 mg 200 mL/hr over 60 Minutes Intravenous Every 12 hours 02/01/16 0631     01/29/16 0600  vancomycin (VANCOCIN) IVPB 1000 mg/200 mL premix  Status:  Discontinued     1,000 mg 200 mL/hr over 60 Minutes Intravenous Every 8 hours 01/28/16 2043 02/01/16 0631   01/29/16 0600  piperacillin-tazobactam (ZOSYN) IVPB 3.375 g     3.375 g 12.5 mL/hr over 240 Minutes Intravenous Every 8 hours 01/28/16 2043     01/28/16 2045  vancomycin (VANCOCIN) IVPB 1000 mg/200 mL premix     1,000 mg 200 mL/hr over 60 Minutes Intravenous  Once 01/28/16 2043  01/28/16 2225   01/28/16 2045  piperacillin-tazobactam (ZOSYN) IVPB 3.375 g     3.375 g 100 mL/hr over 30 Minutes Intravenous  Once 01/28/16 2043 01/28/16 2120   01/28/16 1730  ceFAZolin (ANCEF) IVPB 1 g/50 mL premix     1 g 100 mL/hr over 30 Minutes Intravenous  Once 01/28/16 1720 01/28/16 1955   01/28/16 1730  vancomycin (VANCOCIN) IVPB 1000 mg/200 mL premix  Status:  Discontinued     1,000 mg 200 mL/hr over 60 Minutes Intravenous  Once 01/28/16 1720 01/28/16 2041      Medications: Scheduled Meds: . heparin  5,000 Units Subcutaneous Q8H  . piperacillin-tazobactam (ZOSYN)  IV  3.375 g Intravenous Q8H  . protein supplement shake  11 oz Oral BID BM  . vancomycin  1,000 mg Intravenous Q12H   Continuous Infusions: . dextrose 5 % and 0.45 % NaCl with KCl 20 mEq/L 75 mL/hr at 02/01/16 0843   PRN Meds:.alum & mag hydroxide-simeth, HYDROmorphone (DILAUDID) injection, naproxen, ondansetron **OR** ondansetron (ZOFRAN) IV, oxyCODONE-acetaminophen  Assessment/Plan: Patient Active Problem List   Diagnosis Date Noted  . Neck infection 01/28/2016  . 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 Cellulitis 02/20/2012  . Tobacco abuse 02/20/2012  s/p  Lymph node biopsy, WBC 17 from 15 -continue abx, do not think debridement is warranted, WBC may rise given malignancy in area causing worse  inflammatory response, plan for abx as outpatient and wound care.     LOS: 3 days   Mickeal Skinner, MD Pg# [phone] [facility]

## 2016-02-01 NOTE — Progress Notes (Signed)
Brief Pharmacy Antibiotic Note  Johnny Mooney is a 46 y.o. male admitted on 01/28/2016 with cellulitis of scalp and neck surgical wounds on day #2 of vancomycin and Zosyn.  Assessement:  VT=19 mg/L Plan: Continue Zosyn 3.375g IV Q8H infused over 4hrs. Decrease Vancomycin to 1 Gm IV q12h Recheck Vanc trough as needed. Goal VT 10-15.  Follow up renal fxn, culture results, and clinical course.   Height: 5\' 11"  (180.3 cm) Weight: 226 lb 3.2 oz (102.6 kg) IBW/kg (Calculated) : 75.3  Temp (24hrs), Avg:99.5 F (37.5 C), Min:99.1 F (37.3 C), Max:100 F (37.8 C)   Recent Labs Lab 01/28/16 1646 01/28/16 1839 01/29/16 0505 01/29/16 2127 01/30/16 0531 01/31/16 0523 02/01/16 0535  WBC 17.1*  --  14.7*  --  13.3* 15.7* 17.3*  CREATININE 0.74  --   --   --   --  0.83  --   LATICACIDVEN  --  1.28  --   --   --   --   --   VANCOTROUGH  --   --   --  15  --   --  19    Estimated Creatinine Clearance: 139.9 mL/min (by C-G formula based on SCr of 0.83 mg/dL).    No Known Allergies  Antimicrobials this admission:  1/4 Vanc >> 1/4 Zosyn >>  Dose adjustments this admission:  1/5 2100 VT: 15 on 1g q8h prior to the 4th dose, upper end of goal range.  Microbiology results:  1/5 BCx: ngtd  Thank you for allowing pharmacy to be a part of this patient's care.   Dorrene German 02/01/2016 6:31 AM

## 2016-02-01 NOTE — Progress Notes (Signed)
Patient upset because he dressing hadn't been changed since 3 pm. I explained to pt that the order was for it to be changed every 8 hrs, and the night nurse coming on would change it after report.  He stated that their wasn't any time period ordered... It was just when he said.  Pt also wanted the dressing on his IV changed. He became angry when we asked him to wait a few minutes before we changed his dressing.  After a few minutes, the girlfriend came out and aid she would change it  because she new how, and that pt was getting mad having to wait.  Oncoming night nurse agreed to let girlfriend change the dressing.

## 2016-02-02 LAB — CULTURE, BLOOD (ROUTINE X 2): Culture: NO GROWTH

## 2016-02-02 MED ORDER — ONDANSETRON 4 MG PO TBDP: 4.0000 mg | ORAL_TABLET | Freq: Four times a day (QID) | ORAL | 0 refills | Status: DC | PRN

## 2016-02-02 MED ORDER — IBUPROFEN 800 MG PO TABS: 800.0000 mg | ORAL_TABLET | Freq: Three times a day (TID) | ORAL | 0 refills | Status: DC | PRN

## 2016-02-02 MED ORDER — OXYCODONE HCL 5 MG PO TABS: 5.0000 mg | ORAL_TABLET | ORAL | 0 refills | Status: DC | PRN

## 2016-02-02 MED ORDER — KETOROLAC TROMETHAMINE 30 MG/ML IJ SOLN: 30.0000 mg | Freq: Once | INTRAMUSCULAR | Status: DC

## 2016-02-02 MED ORDER — AMOXICILLIN-POT CLAVULANATE 875-125 MG PO TABS: 1.0000 | ORAL_TABLET | Freq: Two times a day (BID) | ORAL | 0 refills | Status: DC

## 2016-02-02 NOTE — Progress Notes (Signed)
Discharge instructions discussed with patient and mother, verbalized agreement and understanding.  Patent requests alternate pain medication than what is written at discharge( Oxycodone)  Attempted to explain to patient that du to it being 1730, I was not able to get a new hard script from MD, encouraged to fill current prescription and notify MD in am.  Verbalized agreement and understanding

## 2016-02-02 NOTE — Discharge Summary (Signed)
Physician Discharge Summary  Patient ID: Johnny Mooney MRN: [id number] DOB/AGE: 09/27/1970 46 y.o.  Admit date: 01/28/2016 Discharge date: 02/02/2016  Admission Diagnoses:  Discharge Diagnoses:  Active Problems:   Neck infection   Discharged Condition: stable  Hospital Course: 46 yo male with SCC s/p lymph node biopsy presented with dehisced neck incision. During hospitalization he underwent wound care and IV abx with some improvement. The debri of the incision cleaned up with wound care. He continued to have neck pain and some nausea throughout the hospitalization. He was discharged home on oral medications on 02/02/15  Consults: None  Significant Diagnostic Studies:   Treatments: IV abx, pain meds, IV hydration, wound care  Discharge Exam: Blood pressure 116/87, pulse 95, temperature 99.3 F (37.4 C), temperature source Oral, resp. rate 16, height 5\' 11"  (1.803 m), weight 102.6 kg (226 lb 3.2 oz), SpO2 99 %. General appearance: alert and cooperative Head: right posterior scalp incision open with healthy base, right neck wound with minimal fibrinous tissue, area bulging from underlying tumor. Resp: clear to auscultation bilaterally Cardio: regular rate and rhythm, S1, S2 normal, no murmur, click, rub or gallop GI: soft, non-tender; bowel sounds normal; no masses,  no organomegaly  Disposition: 01-Home or Self Care  Discharge Instructions    Call MD for:  persistant nausea and vomiting    Complete by:  As directed    Call MD for:  severe uncontrolled pain    Complete by:  As directed    Diet - low sodium heart healthy    Complete by:  As directed    Discharge wound care:    Complete by:  As directed    Twice a day dressing changes with saline moistened gauze to neck incision.   Increase activity slowly    Complete by:  As directed      Allergies as of 02/02/2016   No Known Allergies     Medication List    STOP taking these medications   cephALEXin 500 MG  capsule Commonly known as:  KEFLEX   doxycycline 100 MG tablet Commonly known as:  VIBRA-TABS   HYDROcodone-acetaminophen 5-325 MG tablet Commonly known as:  NORCO/VICODIN     TAKE these medications   amoxicillin-clavulanate 875-125 MG tablet Commonly known as:  AUGMENTIN Take 1 tablet by mouth every 12 (twelve) hours.   ibuprofen 800 MG tablet Commonly known as:  ADVIL,MOTRIN Take 1 tablet (800 mg total) by mouth every 8 (eight) hours as needed. What changed:  Another medication with the same name was removed. Continue taking this medication, and follow the directions you see here.   ondansetron 4 MG disintegrating tablet Commonly known as:  ZOFRAN-ODT Take 1 tablet (4 mg total) by mouth every 6 (six) hours as needed for nausea.   oxyCODONE 5 MG immediate release tablet Commonly known as:  Oxy IR/ROXICODONE Take 1 tablet (5 mg total) by mouth every 4 (four) hours as needed for severe pain.        Signed: Arta Bruce Zimere Dunlevy 02/02/2016, 1:07 PM

## 2016-02-02 NOTE — Progress Notes (Signed)
Upon time for a dressing change patient refused to allow nurse to remove tape/abd dressing, went into bathroom, and was found to be scrubbing it with a wash cloth. Pt educated on risks of infection but patient states he knows what he is doing and he has been doing this the whole time. Sent nurse away so that he could finish until time to be dressed.

## 2016-02-03 ENCOUNTER — Telehealth: Payer: Self-pay | Admitting: *Deleted

## 2016-02-03 ENCOUNTER — Ambulatory Visit
Admission: RE | Admit: 2016-02-03 | Discharge: 2016-02-03 | Disposition: A | Discharge status: Home / Self Care | Payer: Medicaid Other | Source: Ambulatory Visit | Attending: Radiation Oncology | Admitting: Radiation Oncology

## 2016-02-03 ENCOUNTER — Telehealth: Payer: Self-pay

## 2016-02-03 ENCOUNTER — Ambulatory Visit: Payer: Medicaid Other | Admitting: Radiation Oncology

## 2016-02-03 ENCOUNTER — Ambulatory Visit: Admission: RE | Admit: 2016-02-03 | Payer: Medicaid Other | Source: Ambulatory Visit

## 2016-02-03 DIAGNOSIS — C4442 Squamous cell carcinoma of skin of scalp and neck: Secondary | ICD-10-CM | POA: Insufficient documentation

## 2016-02-03 DIAGNOSIS — Z833 Family history of diabetes mellitus: Secondary | ICD-10-CM | POA: Insufficient documentation

## 2016-02-03 DIAGNOSIS — Z510 Encounter for antineoplastic radiation therapy: Secondary | ICD-10-CM | POA: Insufficient documentation

## 2016-02-03 DIAGNOSIS — Z825 Family history of asthma and other chronic lower respiratory diseases: Secondary | ICD-10-CM | POA: Insufficient documentation

## 2016-02-03 LAB — CULTURE, BLOOD (ROUTINE X 2): CULTURE: NO GROWTH

## 2016-02-03 NOTE — Telephone Encounter (Signed)
CALLED PATIENT TO INFORM OF PET SCAN AND CT ON 02-17-16 @ [facility], MAIL BOX FULL, WILL CALL LATER

## 2016-02-03 NOTE — Telephone Encounter (Signed)
I called and spoke to Johnny Mooney regarding his appointment today. He is at home having just being discharged from the hospital yesterday, and reports he is feeling badly. He feels like he is not any better than when he first went into the hospital. He reports being unhappy with the care he received in the hospital regarding his wound care. I suggested he return to the Emergency Room if he felt like he needed continued care regarding his infection. He declined to do this as he feels like he would not be able to be helped. I told him that I would inform Gayleen Orem RN (Head and Neck Navigator) of our conversation, and that Liliane Channel would call him and speak to him. He voiced his appreciation of my phone call and informed me that he would not be coming in today and would like to reschedule.

## 2016-02-03 NOTE — Telephone Encounter (Signed)
Oncology Nurse Navigator Documentation  Spoke with Mr. Ramakrishnan at his home s/p his DC yesterday. I acknowledged his expressed concerns re his recent admission. He acknowledged he is keeping his appt with Dr. Constance Holster tomorrow. I noted his PET and CT neck have been rescheduled for 02/17/15, that an appt with [name] will be rescheduled pending the outcome of this appt with Dr. Constance Holster. I asked him to contact me tomorrow with an update, he agreed.  Gayleen Orem, RN, BSN, [place] Neck Oncology Nurse [place] at [place] [phone]

## 2016-02-04 ENCOUNTER — Telehealth: Payer: Self-pay | Admitting: *Deleted

## 2016-02-05 ENCOUNTER — Telehealth: Payer: Self-pay | Admitting: *Deleted

## 2016-02-05 NOTE — Telephone Encounter (Signed)
Oncology Nurse Navigator Documentation  Received call from Johnny Mooney's mother.  She explained he missed yesterday's appt with Dr. Constance Holster bc she was unaware and she is providing him transportation. I answered her questions re purpose of appt with Dr. Constance Holster, importance of treatment option discussion, after which an appt with [name] can be scheduled as necessary.  I provided her [facility] phone number, encouraged her to dass ASAP.  She agreed to call me with appt date/time.  I then spoke with Dr. Janeice Robinson MA, Olivia Mackie, to provide patient update.  She indicated she would notify Scheduling to arrange appt.    Johnny Orem, RN, BSN, [place] Neck Oncology Nurse [place] at [place] [phone]

## 2016-02-05 NOTE — Telephone Encounter (Signed)
Oncology Nurse Navigator Documentation   Returned Mr. [facility] call.  He stated he did not have appt with ENT Dr. Constance Holster this afternoon, called "all my doctors" to check.  I indicated I would follow-up with Dr. Janeice Robinson office tomorrow.  I later received VM from Dr. Janeice Robinson office indicating Mr. Radermacher was a No Show.  Gayleen Orem, RN, BSN, [place] Neck Oncology Nurse [place] at [place] [phone] -

## 2016-02-05 NOTE — Telephone Encounter (Signed)
Oncology Nurse Navigator Documentation  Received call from patient, I indicated his appt with Dr. Constance Holster has been rescheduled for 1/18 2:10 per his mother's message.  I confirmed his understanding of [facility] location, provided address.  Gayleen Orem, RN, BSN, [place] Neck Oncology Nurse [place] at [place] [phone]

## 2016-02-05 NOTE — Telephone Encounter (Signed)
Oncology Nurse Navigator Documentation  Received VM from patient's mother indicating he has an appt with ENT Dr. Constance Holster 1/18 2:10 PM.  Gayleen Orem, RN, BSN, [place] at [place] [phone]

## 2016-02-11 ENCOUNTER — Telehealth: Payer: Self-pay | Admitting: *Deleted

## 2016-02-11 NOTE — Telephone Encounter (Signed)
Oncology Nurse Navigator Documentation  Received call from Johnny Mooney questioning: 1.  Necessity of appt with ENT Dr. Constance Holster scheduled for this afternoon.  I indicated appt important for treatment planning per discussion at recent H&N TB, strongly encouraged him to attend.  I asked him to call me following appt to provide update, he agreed. 2.  Can he take medications today before ENT appt.  He stated he was told not to eat/drink 6 hrs prior to appt.  I explained this is guidance for next week Wednesday's PET and CT scans.  He voiced understanding.  Gayleen Orem, RN, BSN, [place] Neck Oncology Nurse [place] at [place] [phone]

## 2016-02-15 ENCOUNTER — Telehealth: Payer: Self-pay | Admitting: *Deleted

## 2016-02-15 NOTE — Telephone Encounter (Signed)
Oncology Nurse Navigator Documentation  Returned call to patient's mother, provided clarification of 1/24 PET and CT scans with 0830 arrival to [facility], NPO status after midnight.  I explained pre-payment not necessary, a payment plan can be established.  I LVMM on patient phone with same information.  Gayleen Orem, RN, BSN, [place] Neck Oncology Nurse [place] at [place] [phone]

## 2016-02-16 ENCOUNTER — Telehealth: Payer: Self-pay | Admitting: *Deleted

## 2016-02-16 NOTE — Progress Notes (Signed)
error 

## 2016-02-16 NOTE — Telephone Encounter (Signed)
Oncology Nurse Navigator Documentation  Unable to reach patient, called/spoke with his mother to inform of appts scheduled for this Friday:  [place], 1300 [name], 1400 radiation planning.  She understands these appts will be cancelled if results of tomorrow's PET and CT Head indicate surgery with Dr. Constance Holster is appropriate.  I reiterated importance of tomorrow's 0900 PET (with 0830 arrival, NPO after midnight tonight) to determine treatment plan.  She voiced understanding.  She understands to call me with questions/concerns.  Gayleen Orem, RN, BSN, [place] Neck Oncology Nurse [place] at [place] [phone]

## 2016-02-17 ENCOUNTER — Telehealth: Payer: Self-pay | Admitting: *Deleted

## 2016-02-17 ENCOUNTER — Encounter: Payer: Self-pay | Admitting: *Deleted

## 2016-02-17 ENCOUNTER — Ambulatory Visit (HOSPITAL_COMMUNITY): Payer: Self-pay

## 2016-02-17 ENCOUNTER — Ambulatory Visit (HOSPITAL_COMMUNITY)
Admission: RE | Admit: 2016-02-17 | Discharge: 2016-02-17 | Disposition: A | Discharge status: Home / Self Care | Payer: Medicaid Other | Source: Ambulatory Visit | Attending: Radiation Oncology | Admitting: Radiation Oncology

## 2016-02-17 ENCOUNTER — Encounter (HOSPITAL_COMMUNITY): Payer: Self-pay

## 2016-02-17 ENCOUNTER — Encounter (HOSPITAL_COMMUNITY)
Admission: RE | Admit: 2016-02-17 | Discharge: 2016-02-17 | Disposition: A | Discharge status: Home / Self Care | Payer: Medicaid Other | Source: Ambulatory Visit | Attending: Radiation Oncology | Admitting: Radiation Oncology

## 2016-02-17 DIAGNOSIS — C4442 Squamous cell carcinoma of skin of scalp and neck: Secondary | ICD-10-CM | POA: Diagnosis not present

## 2016-02-17 DIAGNOSIS — C779 Secondary and unspecified malignant neoplasm of lymph node, unspecified: Secondary | ICD-10-CM

## 2016-02-17 DIAGNOSIS — R590 Localized enlarged lymph nodes: Secondary | ICD-10-CM | POA: Insufficient documentation

## 2016-02-17 DIAGNOSIS — C770 Secondary and unspecified malignant neoplasm of lymph nodes of head, face and neck: Secondary | ICD-10-CM | POA: Insufficient documentation

## 2016-02-17 LAB — GLUCOSE, CAPILLARY: GLUCOSE-CAPILLARY: 118 mg/dL — AB (ref 65–99)

## 2016-02-17 MED ORDER — IOPAMIDOL (ISOVUE-300) INJECTION 61%
INTRAVENOUS | Status: AC
  Administered 2016-02-17: 75 mL
  Filled 2016-02-17: qty 75

## 2016-02-17 MED ORDER — FLUDEOXYGLUCOSE F - 18 (FDG) INJECTION
11.3000 | Freq: Once | INTRAVENOUS | Status: AC | PRN
  Administered 2016-02-17: 11.3 via INTRAVENOUS

## 2016-02-17 NOTE — Telephone Encounter (Signed)
Oncology Nurse Navigator Documentation  LVMM for Dr. Constance Holster indicating Mr. Donisi arrived for PET and CTs, family requests call with results.  Gayleen Orem, RN, BSN, [place] Neck Oncology Nurse [place] at [place] [phone]

## 2016-02-17 NOTE — Telephone Encounter (Signed)
Oncology Nurse Navigator Documentation  Returned patient mother's call, confirmed appts scheduled for Friday.  Gayleen Orem, RN, BSN, [place] Neck Oncology Nurse [place] at [place] [phone]

## 2016-02-17 NOTE — Telephone Encounter (Signed)
Oncology Nurse Navigator Documentation  Called patient's girlfriend, indicated Mr Millison should call [facility], ask for Nurse Triage, to request medication refill originally issue by Dr. Kieth Brightly.  She voiced understanding.  Gayleen Orem, RN, BSN, [place] Neck Oncology Nurse [place] at [place] [phone]

## 2016-02-17 NOTE — Progress Notes (Signed)
Oncology Nurse Navigator Documentation  Met with Mr. Hanshaw girlfriend [facility] while he was having PET and CTs. Introduced myself, provided contact information. She understands he has appts this Friday to see [name], Radiation Oncology, pending results of today's PET. She stated he needs analgesic refill originally issued s/p surgery 12/2015, has been directed by Dr. Janeice Robinson office to call [facility] but does not know surgeon's name.  I indicated I would contact [facility] on his behalf.  Gayleen Orem, RN, BSN, [place] Neck Oncology Nurse [place] at [place] [phone]

## 2016-02-19 ENCOUNTER — Ambulatory Visit
Admission: RE | Admit: 2016-02-19 | Discharge: 2016-02-19 | Disposition: A | Discharge status: Home / Self Care | Payer: Medicaid Other | Source: Ambulatory Visit | Attending: Radiation Oncology | Admitting: Radiation Oncology

## 2016-02-19 ENCOUNTER — Encounter: Payer: Self-pay | Admitting: *Deleted

## 2016-02-19 ENCOUNTER — Encounter: Payer: Self-pay | Admitting: Radiation Oncology

## 2016-02-19 VITALS — BP 114/83 | HR 89 | Temp 98.2°F | Ht 71.0 in | Wt 155.2 lb

## 2016-02-19 DIAGNOSIS — C4442 Squamous cell carcinoma of skin of scalp and neck: Secondary | ICD-10-CM

## 2016-02-19 DIAGNOSIS — Z833 Family history of diabetes mellitus: Secondary | ICD-10-CM | POA: Diagnosis not present

## 2016-02-19 DIAGNOSIS — Z825 Family history of asthma and other chronic lower respiratory diseases: Secondary | ICD-10-CM | POA: Diagnosis not present

## 2016-02-19 DIAGNOSIS — Z510 Encounter for antineoplastic radiation therapy: Secondary | ICD-10-CM | POA: Diagnosis present

## 2016-02-19 NOTE — Progress Notes (Addendum)
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 An Aquaplast head and shoulder mask was custom fitted to the patient's anatomy. High-resolution CT axial imaging was obtained of the head and neck without contrast. His IV access could not be maintained, and the patient was in too much pain to attempt IV access for a fourth time. I verified that the quality of the imaging is good for treatment planning. 2 Medically Necessary Treatment Device were fabricated and supervised by me: Aquaplast mask and custom bolus.   Treatment planning note I plan to treat the patient with IMRT. I plan to treat the patient's tumor and bilateral neck nodes. I plan to treat to a total dose of 70 Gray in 35  fractions. Dose calculation was ordered from dosimetry.  IMRT planning Note  IMRT is medically necessary and an important modality to deliver adequate dose to the patient's at risk tissues while sparing the patient's normal structures, including the: esophagus, parotid tissue, mandible, brain stem, brain, spinal cord, oral cavity, brachial plexus.  This justifies the use of IMRT in the patient's treatment.    -----------------------------------  Eppie Gibson, MD

## 2016-02-19 NOTE — Progress Notes (Addendum)
Radiation Oncology         [phone] ________________________________  Initial Outpatient Consultation  Name: Johnny Mooney MRN: [id number]  Date: 02/19/2016  DOB: 07-Mar-1970  CC:No PCP Per Patient  Kinsinger, Arta Bruce, *   REFERRING PHYSICIAN: Kinsinger, Arta Bruce, *  DIAGNOSIS:    ICD-9-CM ICD-10-CM   1. 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 male who presented in October 2017 with throbbing pain and an enlarging mass in posterior scalp. Several subsequent attempts were made to drain the mass with minimal relief. Biopsy of the mass revealed invasive squamous cell carcinoma of posterior scalp soft tissue mass, as well as posterior lymph nodes positive for poorly differentiated squamous cell carcinoma. Resection was attempted by general surgery late last year, but once the extent of disease was determined in the OR, the operation was aborted.  The patient was reviewed at ENT tumor board and soon after saw Dr. Constance Holster of otolaryngology on 02/11/16, for consultation. The patient is a candidate for excision and posterolateral neck dissection provided that the patient's PET scan was negative for distant metastatic disease. PET scan on 02/17/16 showed scalp squamous cell carcinoma with metastatic lymph nodes to bilateral neck but not distantly. There has been remarkable progression of the suboccipital and cervical adenopathy on the right with bulky nodal mass measuring up to 10 cm and showing deep ulceration. New malignant adenopathy in the right parotid bed and left neck  noted. Stable appearance of scalp resection site.   No bone erosion or intracranial metastasis noted.  I have spoken with Dr Constance Holster about the PET results.  Dr Constance Holster has met with the patient and scheduled him for plastic surgery consultation as surgery would require significant reconstruction.   Unfortunately, the patient has demonstrated suboptimal attendance with his appointments thus far and is challenging to get in touch with per our navigator. Patient was discussed at tumor board and is not a candidate for chemotherapy currently .  On review of systems, the patient reports losing 50 lbs since his diagnosis. He reports having pain. Additionally, he reports he is very sensitive to sound. He reports difficulty swallowing because of a dry mouth with thick saliva. He reports feeling that he aspirates his saliva. His partner reports a rattle in his throat which is worse at night. The patient reports he is not sleeping well at night. The patient reports he is a current everyday smoker of cigars.  The patient reports to the clinic today to discuss the role that radiation may play in the treatment of his disease. He is accompanied by his mother and partner. We are joined today by Gayleen Orem, RN, Head and Neck Nurse Navigator.   PREVIOUS RADIATION THERAPY: No  PAST MEDICAL HISTORY:  has a past medical history of Cancer ([place]) (11/2015); Cellulitis of left lower extremity; History of traumatic head injury; and Subcutaneous mass of head.    PAST SURGICAL HISTORY: Past Surgical History:  Procedure Laterality Date  . EXCISION MASS HEAD N/A 11/06/2015   Procedure: EXCISION MASS HEAD;  Surgeon: [name], MD;  Location: [facility];  Service: General;  Laterality: N/A;  . LYMPH NODE BIOPSY N/A 01/07/2016   Procedure: EXCISIONAL LYMPH NODE BIOPSY;  Surgeon: [name], MD;  Location: [facility];  Service: General;  Laterality: N/A;  . MASS EXCISION N/A 01/07/2016   Procedure:  EXCISION OF SCALP MASS;  Surgeon: [name], MD;  Location: [facility];  Service: General;  Laterality: N/A;    FAMILY HISTORY: family history includes Asthma in his father; Diabetes Mellitus II in his mother.  SOCIAL HISTORY:  reports that he has been smoking Cigars.  He has a 1.65 pack-year smoking history. He has never used smokeless tobacco. He reports that he drinks about 1.0 oz of alcohol per week . He reports that he does not use drugs.  ALLERGIES: Patient has no known allergies.  MEDICATIONS:  Current Outpatient Prescriptions  Medication Sig Dispense Refill  . ibuprofen (ADVIL,MOTRIN) 800 MG tablet Take 1 tablet (800 mg total) by mouth every 8 (eight) hours as needed. 30 tablet 0  . ondansetron (ZOFRAN-ODT) 4 MG disintegrating tablet Take 1 tablet (4 mg total) by mouth every 6 (six) hours as needed for nausea. 20 tablet 0  . oxyCODONE (OXY IR/ROXICODONE) 5 MG immediate release tablet Take 1 tablet (5 mg total) by mouth every 4 (four) hours as needed for severe pain. 30 tablet 0   No current facility-administered medications for this encounter.     REVIEW OF SYSTEMS:  Notable for that above.   PHYSICAL EXAM:  height is 5' 11"  (1.803 m) and weight is 155 lb 3.2 oz (70.4 kg). His temperature is 98.2 F (36.8 C). His blood pressure is 114/83 and his pulse is 89. His oxygen saturation is 98%.   General: Alert and oriented, in mild distress, in Wheelchair HEENT: Extraocular movements are intact. Oropharynx and oral cavity are clear. Some fullness to the tonsils bilaterally. A healed scar on the right occipital scalp about 2.5 cm in greatest dimension is noted - no sign of local recurrence there. Neck: Along the right posterior neck he has a mass which is about the diameter of a softball with central necrosis, and the surface is ulcerated and bleeding and there are several nodules emanating from the dominate mass. Throughout the rest of the cervical and supraclavicular neck there are  no other obvious masses, though I am not palpating very hard due to tenderness. Heart: Regular in rate and rhythm with no murmurs. Chest: No rhonchi, wheezes, or rales appreciated bilaterally. Limited inspiratory effort. Lymphatics: see Neck Exam Skin: see Neck Exam. Psychiatric: He is alert and conversant, and able to provide a history.      ECOG = 3  0 - Asymptomatic (Fully active, able to carry on all predisease activities without restriction)  1 - Symptomatic but completely ambulatory (Restricted in physically strenuous activity but ambulatory and able to carry out work of a light or sedentary nature. For example, light housework, office work)  2 - Symptomatic, <50% in bed during the day (Ambulatory and capable of all self care but unable to carry out any work activities. Up and about more than 50% of waking hours)  3 - Symptomatic, >50% in bed, but not bedbound (Capable of only limited self-care, confined to bed or chair 50% or more of waking hours)  4 - Bedbound (Completely disabled. Cannot carry on any self-care. Totally confined to bed or chair)  5 - Death   Gaspar Bidding [facility],  [place], et al. [phone]). "Toxicity and response criteria of the [facility]". [place] Oncol. 5 (6): 649-55   LABORATORY DATA:  Lab Results  Component Value Date   WBC 17.3 (H) 02/01/2016   HGB 11.9 (L) 02/01/2016   HCT 36.3 (L) 02/01/2016   MCV 83.8 02/01/2016   PLT 335 02/01/2016   CMP     Component Value Date/Time   NA 134 (L) 01/31/2016 0523   K 4.2 01/31/2016 0523   CL 100 (L) 01/31/2016 0523   CO2 26 01/31/2016 0523   GLUCOSE 124 (H) 01/31/2016 0523   BUN <5 (L) 01/31/2016 0523   CREATININE 0.83 01/31/2016 0523   CALCIUM 11.3 (H) 01/31/2016 0523   PROT 8.0 01/28/2016 1646   ALBUMIN 3.5 01/28/2016 1646   AST 18 01/28/2016 1646   ALT 20 01/28/2016 1646   ALKPHOS 100 01/28/2016 1646   BILITOT 0.3 01/28/2016 1646   GFRNONAA >60 01/31/2016 0523   GFRAA  >60 01/31/2016 0523         RADIOGRAPHY: Ct Head W Contrast  Result Date: 02/17/2016 CLINICAL DATA:  Metastatic squamous cell carcinoma. EXAM: CT HEAD WITH CONTRAST CT NECK WITH CONTRAST TECHNIQUE: Contiguous axial images were obtained from the base of the skull through the vertex with intravenous contrast. Multidetector CT imaging of the and neck was performed using the standard protocol following the bolus administration of intravenous contrast. CONTRAST:  75 cc Isovue 300 intravenous COMPARISON:  12/10/2015 FINDINGS: CT HEAD FINDINGS Brain: No abnormal intracranial enhancement or swelling to suggest cerebral metastatic disease. Vascular: Major vessels are patent. Skull: Persistent broad area of enhancement in the posterior right scalp at site of squamous cell carcinoma resection. No bone erosion noted. There is a subgaleal avidly enhancing nodule along the left occipital bone measuring 11 mm, similar to prior and likely non hypermetabolic. Sinuses/Orbits: Negative CT NECK FINDINGS Pharynx and larynx: Normal. No mass or swelling. Salivary glands: There is a centrally necrotic mass in the right parotid tail, given constellation of findings consistent with adenopathy. The lesion measures 21 mm. No primary mass suspected. Thyroid: Negative Lymph nodes: Previously identified lymphadenopathy in the right suboccipital region and posterior triangle has enlarged markedly, with nodal conglomerate measuring up to 10 cm today. There is gross extracapsular spread with invasion of superficial and deep neck musculature. As above, there is new right parotid bed necrotic lymphadenopathy with extracapsular disease measuring 21 mm. Avidly enhancing lymph nodes extend throughout the right neck, but not reach the supraclavicular fossa. There are 2 avidly enhancing, lobulated lymph nodes in the left cervical chain along the posterior margin of the sternocleidomastoid, consistent with metastatic involvement. These are newly  patent and measure up to 1 cm in long axis. Submental lymph nodes highlighted previously do not appear involved today. Vascular: Effaced appearance of the right internal jugular vein superiorly, without thrombosis. Major vessels are patent Mastoids and visualized paranasal sinuses: Clear. Skeleton: No erosion at the right stylomastoid foramen. No osseous metastasis noted. Periapical erosion around the low left terminal molar, which has undergone endodontal repair. C3-4 non segmentation. Upper chest: No apical lung nodules.  Contemporaneous PET-CT. IMPRESSION: 1. 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 There has been remarkable progression of the suboccipital and cervical adenopathy on the right with bulky nodal mass measuring up to 10 cm and showing deep ulceration. New malignant adenopathy in the right parotid bed and left neck. 2. Stable appearance of scalp resection site. CT cannot differentiate between residual tumor  and granulation tissue, reference contemporaneous PET-CT. No bone erosion or intracranial metastasis. Electronically Signed   By: Monte Fantasia M.D.   On: 02/17/2016 12:37   Ct Soft Tissue Neck W Contrast  Result Date: 02/17/2016 CLINICAL DATA:  Metastatic squamous cell carcinoma. EXAM: CT HEAD WITH CONTRAST CT NECK WITH CONTRAST TECHNIQUE: Contiguous axial images were obtained from the base of the skull through the vertex with intravenous contrast. Multidetector CT imaging of the and neck was performed using the standard protocol following the bolus administration of intravenous contrast. CONTRAST:  75 cc Isovue 300 intravenous COMPARISON:  12/10/2015 FINDINGS: CT HEAD FINDINGS Brain: No abnormal intracranial enhancement or swelling to suggest cerebral metastatic disease. Vascular: Major vessels are patent. Skull: Persistent broad area of enhancement in the posterior right scalp at site of squamous cell carcinoma resection. No bone erosion noted. There is a subgaleal avidly  enhancing nodule along the left occipital bone measuring 11 mm, similar to prior and likely non hypermetabolic. Sinuses/Orbits: Negative CT NECK FINDINGS Pharynx and larynx: Normal. No mass or swelling. Salivary glands: There is a centrally necrotic mass in the right parotid tail, given constellation of findings consistent with adenopathy. The lesion measures 21 mm. No primary mass suspected. Thyroid: Negative Lymph nodes: Previously identified lymphadenopathy in the right suboccipital region and posterior triangle has enlarged markedly, with nodal conglomerate measuring up to 10 cm today. There is gross extracapsular spread with invasion of superficial and deep neck musculature. As above, there is new right parotid bed necrotic lymphadenopathy with extracapsular disease measuring 21 mm. Avidly enhancing lymph nodes extend throughout the right neck, but not reach the supraclavicular fossa. There are 2 avidly enhancing, lobulated lymph nodes in the left cervical chain along the posterior margin of the sternocleidomastoid, consistent with metastatic involvement. These are newly patent and measure up to 1 cm in long axis. Submental lymph nodes highlighted previously do not appear involved today. Vascular: Effaced appearance of the right internal jugular vein superiorly, without thrombosis. Major vessels are patent Mastoids and visualized paranasal sinuses: Clear. Skeleton: No erosion at the right stylomastoid foramen. No osseous metastasis noted. Periapical erosion around the low left terminal molar, which has undergone endodontal repair. C3-4 non segmentation. Upper chest: No apical lung nodules.  Contemporaneous PET-CT. IMPRESSION: 1. 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 There has been remarkable progression of the suboccipital and cervical adenopathy on the right with bulky nodal mass measuring up to 10 cm and showing deep ulceration. New malignant adenopathy in the right parotid bed and left  neck. 2. Stable appearance of scalp resection site. CT cannot differentiate between residual tumor and granulation tissue, reference contemporaneous PET-CT. No bone erosion or intracranial metastasis. Electronically Signed   By: Monte Fantasia M.D.   On: 02/17/2016 12:37   Nm Pet Image Initial (pi) Skull Base To Thigh  Result Date: 02/17/2016 CLINICAL DATA:  Initial treatment strategy for squamous cell carcinoma of the right occipital scalp with metastatic disease posterior cervical lymph nodes. EXAM: NUCLEAR MEDICINE PET SKULL BASE TO THIGH TECHNIQUE: 11.3 mCi F-18 FDG was injected intravenously. Full-ring PET imaging was performed from the skull vertex to the thigh after the radiotracer. CT data was obtained and used for attenuation correction and anatomic localization. FASTING BLOOD GLUCOSE:  Value: 118 mg/dl COMPARISON:  CT scan from 12/10/2015 FINDINGS: HEAD AND NECK Occipital node/mass measuring 9.7 by 6.2 cm with maximum SUV 49.5, and associated cutaneous ulceration along with some central necrosis. Right infra-auricular lymph node observed, maximum SUV 38.3.  Small superficial lymph nodes superficial to the sternocleidomastoid are observed on the right, measuring up to 0.8 cm on image 50/4 with maximum SUV 8.0. Lobularity of the dominant occipital nodal mass is probably due to confluent adenopathy. Symmetric tonsillar activity is probably physiologic. A contralateral (left-sided) level IIb lymph node measures 7 mm in short axis on image 58/4 and has a maximum standard uptake value of 4.6, much less than the contralateral nodes but enough to stand out against the background tissues. CHEST No hypermetabolic mediastinal or hilar nodes. No suspicious pulmonary nodules on the CT data. ABDOMEN/PELVIS No abnormal hypermetabolic activity within the liver, pancreas, adrenal glands, or spleen. No hypermetabolic lymph nodes in the abdomen or pelvis. SKELETON Diffuse hypermetabolic activity in the skeleton without  CT correlate. IMPRESSION: 1. Occipital node/mass measuring up to 9.7 cm in long axis, maximum SUV 49.5, with cutaneous ulceration and central necrosis. Adjacent pathologic and enlarged infra auricular lymph node and faintly enlarged superficial lymph nodes on the right (superficial to the sternocleidomastoid). 2. Very faintly hypermetabolic contralateral (left-sided) level IIb lymph node measures 7 mm in short axis. 3. Diffuse hypermetabolic activity in the skeleton without CT correlate, query granulocyte stimulation. 4. No current scalp lesion or metastatic lesion to the chest, abdomen, or pelvis. Electronically Signed   By: Van Clines M.D.   On: 02/17/2016 12:27      IMPRESSION/PLAN: Locally advanced skin cancer.    We discussed surgical options and he understands initial resection of gross disease is standard of care.  However, he is adamant that he does not want to undergo additional surgical excision at this time. Based on the concerns and goals the patient has expressed, I would therefore recommend radiotherapy for this patient urgently, instead. He wants to be as aggressive as possible with radiotherapy.  Surgical resection may be considered later.  This was discussed with Dr. Constance Holster on speaker phone during the consultation. Dr Constance Holster and I will review his status together at future tumor boards as needed.  We discussed the potential risks, benefits, and side effects of radiotherapy. We talked in detail about acute and late effects. We discussed that some of the most bothersome acute effects may be mucositis, dysgeusia, salivary changes, skin irritation, hair loss, dehydration, weight loss and fatigue. We talked about late effects which include but are not necessarily limited to dysphagia, hypothyroidism, nerve injury, spinal cord injury, xerostomia, trismus, and neck edema. No guarantees of treatment were given. A consent form was signed and placed in the patient's medical record. The patient is  enthusiastic about proceeding with treatment. I look forward to participating in the patient's care.   CT Simulation (treatment planning) will take place today, with the intention to begin radiation treatment as soon as possible.   We also discussed that the treatment of head and neck cancer is a multidisciplinary process to maximize treatment outcomes and quality of life. For this reasons the following referrals will be made:  1) Nutritionist for nutrition support during and after treatment.   2) Speech language pathology for swallowing therapy.  3) Social work for social support.   4) Physical therapy due to risk of lymphedema in neck and posture issues, deconditioning.   5) Baseline labs including TSH. No results found for: TSH  We discussed that dental evaluation, possible extractions in the radiation fields, and /or advice on reducing risk of cavities, osteoradionecrosis, or other oral issues is very important. However, I will not make a referral to these services at this time  due to urgency of treatment. We may revisit this at the conclusion of the patient's treatment, or sooner if indicated. For now I will do my best to mold the RT dose around the tooth roots.  Additionally, we discussed that at this time Medical Oncology does not recommend chemotherapy at this time per tumor board discussion. The patient understands and agrees.  Over 60 minutes spent face to face with patient, over 50% of which was spent on counseling and care coordination.  __________________________________________   Eppie Gibson, MD  This document serves as a record of services personally performed by Eppie Gibson, MD. It was created on her behalf by Maryla Morrow, a trained medical scribe. The creation of this record is based on the scribe's personal observations and the provider's statements to them. This document has been checked and approved by the attending provider.

## 2016-02-19 NOTE — Progress Notes (Addendum)
Head and Neck Cancer Location of Tumor / Histology:  11/06/15 Diagnosis Soft tissue mass, simple excision, posterior scalp INVASIVE SQUAMOUS CELL CARCINOMA MARGIN OF RESECTION IS POSITIVE FOR CARCINOMA (1.3 CM)  01/07/16 Diagnosis 1. Lymph node, biopsy, posterior - POSITIVE FOR POORLY DIFFERENTIATED SQUAMOUS CELL CARCINOMA. - SEE COMMENT. 2. Lymph node, biopsy, posterior - POSITIVE FOR POORLY DIFFERENTIATED SQUAMOUS CELL CARCINOMA. - SEE COMMENT. 3. Soft tissue mass, simple excision, posterior scalp - BENIGN FIBROVASCULAR AND MUSCULAR SOFT TISSUE. - SCATTERED FOCI OF CHRONIC INFLAMMATION WITH GIANT CELL REACTION. - NO ATYPIA OR TUMOR SEEN.  Patient presented  with symptoms of: Symptoms of throbbing pain and enlarging mass in posterior scalp. The mass had been attempted drainage multiple times with minimal relief.   Biopsies of  revealed:  Invasive squamous cell carcinoma of posterior scalp soft tissue mass. Posterior Lymph nodes positive for poorly differentiated squamous cell carcinoma  Nutrition Status Yes No Comments  Weight changes? [x]  []  He reports losing weight recently. His family reports he has lost 50 lbs since his diagnosis  Swallowing concerns? [x]  []  He is unable to swallow because of pain related to a dry mouth. He has thick saliva, and feels like he has been aspirating. His partner can hear a rattle in his throat. It is especially worse at night.   PEG? []  [x]     Referrals Yes No Comments  Social Work? []  [x]    Dentistry? []  [x]    Swallowing therapy? []  [x]    Nutrition? []  [x]    Med/Onc? []  [x]     Safety Issues Yes No Comments  Prior radiation? []  []    Pacemaker/ICD? []  [x]    Possible current pregnancy? []  [x]    Is the patient on methotrexate? []  [x]     Tobacco/Marijuana/Snuff/ETOH use: Current everyday smoker of cigars. No smokeless tobacco use, alcohol use on the weekends.   Past/Anticipated interventions by otolaryngology, if any:  Dr. Constance Holster  02/11/16  Past/Anticipated interventions by medical oncology, if any: None  Current Complaints / other details:  He is having pain. He is very sensitive to sound. He is not sleeping well at night.  11/06/15 Procedure: excision of 5cm posterior scalp mass with complex closures Surgeon: Gurney Maxin, M.D. 01/07/16 Procedure: excision of posterior lymph node, reexcision of scalp mass Surgeon: Gurney Maxin, M.D.  BP 114/83   Pulse 89   Temp 98.2 F (36.8 C)   Ht 5\' 11"  (1.803 m)   Wt 155 lb 3.2 oz (70.4 kg)   SpO2 98% Comment: room air  BMI 21.65 kg/m    Wt Readings from Last 3 Encounters:  02/19/16 155 lb 3.2 oz (70.4 kg)  01/30/16 226 lb 3.2 oz (102.6 kg)  01/15/16 178 lb (80.7 kg)   His family tells me that the weight of 01/30/16 was put in incorrectly, it should be for 2017.

## 2016-02-20 ENCOUNTER — Encounter: Payer: Self-pay | Admitting: *Deleted

## 2016-02-20 NOTE — Progress Notes (Signed)
Oncology Nurse Navigator Documentation  Accompanied Johnny Mooney to CT [facility] following IV Start.  [name] and mother present.  He tolerated SIM moderately well, interrupted procedure so he could go to bathroom.  Contrast cancelled d/t IV failure (required 3 attempts to start). I escorted him to lobby for departure. He understands to contact me with needs/concerns.  Gayleen Orem, RN, BSN, [place] Neck Oncology Nurse [place] [facility] [place] [phone]

## 2016-02-20 NOTE — Progress Notes (Addendum)
A user error has taken place: encounter opened in error, closed for administrative reasons.

## 2016-02-20 NOTE — Progress Notes (Addendum)
Oncology Nurse Navigator Documentation  Met with Mr Kasson patient during initial consult with Dr. Squire.  He was accompanied by his girlfriend Leslie and mother.   1. Further introduced myself as his Navigator, explained my role as a member of the Care Team.   2. Provided New Patient Information packet, discussed contents:  Contact information for physician(s), myself, other members of the Care Team.  Advance Directive information ([facility] blue pamphlet with LCSW contact info)  Fall Prevention Patient Safety Plan  Appointment Guideline  Financial Assistance Information sheet  [facility] map with highlight of [facility] 3. Provided introductory explanation of radiation treatment including SIM planning and purpose of Aquaplast head and shoulder mask, showed them example.   4. Per Leslie, he reports today with 50 lb weight loss since October last year, has not been eating much.  I obtained case of Ensure from Barb Neff RD pending scheduling of appt with Nutrition. 5. He indicated he wanted to proceed with RT, did not want surgery at this time.  Dr. Squire spoke with ENT Dr. Rosen by speaker phone to inform, Mr. Werk reiterated his decision, Dr. Rosen supported his choice. 6. Leslie changed scalp dressing (Mr. Colarusso's preference as she has been doing so since initial bx).  I later accompanied him to SIM.  Rick Diehl, RN, BSN, CHPN Head & Neck Oncology Nurse Navigator [facility] at [place] [phone]  

## 2016-02-24 ENCOUNTER — Telehealth: Payer: Self-pay

## 2016-02-24 ENCOUNTER — Telehealth: Payer: Self-pay | Admitting: *Deleted

## 2016-02-24 DIAGNOSIS — Z510 Encounter for antineoplastic radiation therapy: Secondary | ICD-10-CM | POA: Diagnosis not present

## 2016-02-24 NOTE — Telephone Encounter (Signed)
I received a phone call from [place] who his Mr. Gildea girlfriend. She reports that he has not had anything to drink in 2 days. He has not eaten in several days and also had diarrhea this morning. She reports that he is becoming weaker and weaker. I advised that she have him evaluated in the Emergency room at [facility]. She voiced her understanding and knows to call me back if she has any further questions or concerns. I will make [name] aware of the above.

## 2016-02-24 NOTE — Telephone Encounter (Signed)
02-24-16 fax medical records to disability determination services , it was consult note, sim and planning note

## 2016-02-25 ENCOUNTER — Ambulatory Visit: Payer: Medicaid Other | Admitting: Nutrition

## 2016-02-25 ENCOUNTER — Encounter (HOSPITAL_COMMUNITY): Payer: Self-pay

## 2016-02-25 ENCOUNTER — Ambulatory Visit
Admission: RE | Admit: 2016-02-25 | Discharge: 2016-02-25 | Disposition: A | Discharge status: Home / Self Care | Payer: Medicaid Other | Source: Ambulatory Visit | Attending: Radiation Oncology | Admitting: Radiation Oncology

## 2016-02-25 ENCOUNTER — Telehealth: Payer: Self-pay | Admitting: *Deleted

## 2016-02-25 ENCOUNTER — Inpatient Hospital Stay (HOSPITAL_COMMUNITY)
Admission: EM | Admit: 2016-02-25 | Discharge: 2016-03-07 | DRG: 640 | Disposition: A | Discharge status: Home / Self Care | Payer: Medicaid Other | Attending: Family Medicine | Admitting: Family Medicine

## 2016-02-25 ENCOUNTER — Encounter: Payer: Self-pay | Admitting: *Deleted

## 2016-02-25 ENCOUNTER — Other Ambulatory Visit (HOSPITAL_COMMUNITY): Payer: Self-pay

## 2016-02-25 ENCOUNTER — Other Ambulatory Visit: Payer: Self-pay

## 2016-02-25 DIAGNOSIS — Z6821 Body mass index (BMI) 21.0-21.9, adult: Secondary | ICD-10-CM | POA: Diagnosis not present

## 2016-02-25 DIAGNOSIS — C4442 Squamous cell carcinoma of skin of scalp and neck: Secondary | ICD-10-CM | POA: Diagnosis present

## 2016-02-25 DIAGNOSIS — F17290 Nicotine dependence, other tobacco product, uncomplicated: Secondary | ICD-10-CM | POA: Diagnosis present

## 2016-02-25 DIAGNOSIS — R509 Fever, unspecified: Secondary | ICD-10-CM | POA: Diagnosis not present

## 2016-02-25 DIAGNOSIS — N179 Acute kidney failure, unspecified: Secondary | ICD-10-CM | POA: Diagnosis present

## 2016-02-25 DIAGNOSIS — Z87820 Personal history of traumatic brain injury: Secondary | ICD-10-CM

## 2016-02-25 DIAGNOSIS — E440 Moderate protein-calorie malnutrition: Secondary | ICD-10-CM | POA: Insufficient documentation

## 2016-02-25 DIAGNOSIS — B353 Tinea pedis: Secondary | ICD-10-CM | POA: Diagnosis not present

## 2016-02-25 DIAGNOSIS — E43 Unspecified severe protein-calorie malnutrition: Secondary | ICD-10-CM | POA: Diagnosis present

## 2016-02-25 DIAGNOSIS — T402X5A Adverse effect of other opioids, initial encounter: Secondary | ICD-10-CM | POA: Diagnosis present

## 2016-02-25 DIAGNOSIS — E876 Hypokalemia: Secondary | ICD-10-CM | POA: Diagnosis not present

## 2016-02-25 DIAGNOSIS — G9341 Metabolic encephalopathy: Secondary | ICD-10-CM | POA: Diagnosis present

## 2016-02-25 DIAGNOSIS — F419 Anxiety disorder, unspecified: Secondary | ICD-10-CM | POA: Diagnosis not present

## 2016-02-25 DIAGNOSIS — K5909 Other constipation: Secondary | ICD-10-CM | POA: Diagnosis present

## 2016-02-25 DIAGNOSIS — R110 Nausea: Secondary | ICD-10-CM | POA: Diagnosis present

## 2016-02-25 DIAGNOSIS — E860 Dehydration: Secondary | ICD-10-CM | POA: Diagnosis present

## 2016-02-25 DIAGNOSIS — R5081 Fever presenting with conditions classified elsewhere: Secondary | ICD-10-CM | POA: Diagnosis not present

## 2016-02-25 DIAGNOSIS — N19 Unspecified kidney failure: Secondary | ICD-10-CM

## 2016-02-25 DIAGNOSIS — A419 Sepsis, unspecified organism: Secondary | ICD-10-CM | POA: Diagnosis not present

## 2016-02-25 DIAGNOSIS — C770 Secondary and unspecified malignant neoplasm of lymph nodes of head, face and neck: Secondary | ICD-10-CM | POA: Diagnosis present

## 2016-02-25 DIAGNOSIS — Z720 Tobacco use: Secondary | ICD-10-CM | POA: Diagnosis present

## 2016-02-25 LAB — BASIC METABOLIC PANEL
ANION GAP: 9 (ref 5–15)
ANION GAP: 8 (ref 5–15)
BUN: 64 mg/dL — AB (ref 6–20)
BUN: 62 mg/dL — ABNORMAL HIGH (ref 6–20)
CALCIUM: 12.6 mg/dL — AB (ref 8.9–10.3)
CO2: 28 mmol/L (ref 22–32)
CO2: 28 mmol/L (ref 22–32)
Calcium: 13.2 mg/dL (ref 8.9–10.3)
Chloride: 100 mmol/L — ABNORMAL LOW (ref 101–111)
Chloride: 100 mmol/L — ABNORMAL LOW (ref 101–111)
Creatinine, Ser: 1.8 mg/dL — ABNORMAL HIGH (ref 0.61–1.24)
Creatinine, Ser: 1.74 mg/dL — ABNORMAL HIGH (ref 0.61–1.24)
GFR calc Af Amer: 52 mL/min — ABNORMAL LOW (ref >60)
GFR calc Af Amer: 54 mL/min — ABNORMAL LOW (ref >60)
GFR calc non Af Amer: 44 mL/min — ABNORMAL LOW (ref >60)
GFR calc non Af Amer: 46 mL/min — ABNORMAL LOW (ref >60)
GLUCOSE: 98 mg/dL (ref 65–99)
GLUCOSE: 97 mg/dL (ref 65–99)
POTASSIUM: 3.2 mmol/L — AB (ref 3.5–5.1)
Potassium: 3.1 mmol/L — ABNORMAL LOW (ref 3.5–5.1)
Sodium: 137 mmol/L (ref 135–145)
Sodium: 136 mmol/L (ref 135–145)

## 2016-02-25 LAB — COMPREHENSIVE METABOLIC PANEL
ALBUMIN: 3 g/dL — AB (ref 3.5–5.0)
ALK PHOS: 194 U/L — AB (ref 40–150)
ALT: 38 U/L (ref 0–55)
AST: 61 U/L — AB (ref 5–34)
Anion Gap: 14 mEq/L — ABNORMAL HIGH (ref 3–11)
BUN: 72.3 mg/dL — AB (ref 7.0–26.0)
CO2: 30 meq/L — AB (ref 22–29)
Calcium: >16 mg/dL (ref 8.4–10.4)
Chloride: 95 mEq/L — ABNORMAL LOW (ref 98–109)
Creatinine: 2.2 mg/dL — ABNORMAL HIGH (ref 0.7–1.3)
EGFR: 41 mL/min/{1.73_m2} — ABNORMAL LOW (ref >90)
GLUCOSE: 118 mg/dL (ref 70–140)
POTASSIUM: 3.5 meq/L (ref 3.5–5.1)
SODIUM: 138 meq/L (ref 136–145)
Total Bilirubin: 0.43 mg/dL (ref 0.20–1.20)
Total Protein: 9.1 g/dL — ABNORMAL HIGH (ref 6.4–8.3)

## 2016-02-25 LAB — CBC WITH DIFFERENTIAL/PLATELET
BASO%: 0.3 % (ref 0.0–2.0)
BASOS ABS: 0.1 10*3/uL (ref 0.0–0.1)
Basophils Absolute: 0 10*3/uL (ref 0.0–0.1)
Basophils Relative: 0 %
EOS%: 0.1 % (ref 0.0–7.0)
Eosinophils Absolute: 0 10*3/uL (ref 0.0–0.5)
Eosinophils Absolute: 0 10*3/uL (ref 0.0–0.7)
Eosinophils Relative: 0 %
HCT: 35.4 % — ABNORMAL LOW (ref 38.4–49.9)
HEMATOCRIT: 28.8 % — AB (ref 39.0–52.0)
HEMOGLOBIN: 9.7 g/dL — AB (ref 13.0–17.0)
HGB: 11.7 g/dL — ABNORMAL LOW (ref 13.0–17.1)
LYMPH%: 6.6 % — AB (ref 14.0–49.0)
LYMPHS ABS: 2.2 10*3/uL (ref 0.7–4.0)
LYMPHS PCT: 7 %
MCH: 26.6 pg — ABNORMAL LOW (ref 27.2–33.4)
MCH: 26.4 pg (ref 26.0–34.0)
MCHC: 33 g/dL (ref 32.0–36.0)
MCHC: 33.7 g/dL (ref 30.0–36.0)
MCV: 80.8 fL (ref 79.3–98.0)
MCV: 78.5 fL (ref 78.0–100.0)
MONO#: 3 10*3/uL — AB (ref 0.1–0.9)
MONO%: 7.7 % (ref 0.0–14.0)
MONOS PCT: 7 %
Monocytes Absolute: 2.1 10*3/uL — ABNORMAL HIGH (ref 0.1–1.0)
NEUT#: 32.7 10*3/uL — ABNORMAL HIGH (ref 1.5–6.5)
NEUT%: 85.3 % — AB (ref 39.0–75.0)
NEUTROS ABS: 28.2 10*3/uL — AB (ref 1.7–7.7)
NEUTROS PCT: 87 %
PLATELETS: 414 10*3/uL — AB (ref 140–400)
Platelets: 394 10*3/uL (ref 150–400)
RBC: 4.38 10*6/uL (ref 4.20–5.82)
RBC: 3.67 MIL/uL — ABNORMAL LOW (ref 4.22–5.81)
RDW: 13.7 % (ref 11.0–14.6)
RDW: 13.7 % (ref 11.5–15.5)
WBC: 38.3 10*3/uL — ABNORMAL HIGH (ref 4.0–10.3)
WBC: 32.6 10*3/uL — ABNORMAL HIGH (ref 4.0–10.5)
lymph#: 2.5 10*3/uL (ref 0.9–3.3)

## 2016-02-25 LAB — URINALYSIS, ROUTINE W REFLEX MICROSCOPIC
BACTERIA UA: NONE SEEN
BILIRUBIN URINE: NEGATIVE
GLUCOSE, UA: NEGATIVE mg/dL
HGB URINE DIPSTICK: NEGATIVE
KETONES UR: NEGATIVE mg/dL
NITRITE: NEGATIVE
PROTEIN: NEGATIVE mg/dL
Specific Gravity, Urine: 1.008 (ref 1.005–1.030)
Squamous Epithelial / LPF: NONE SEEN
pH: 7 (ref 5.0–8.0)

## 2016-02-25 LAB — MAGNESIUM
Magnesium: 1.6 mg/dL — ABNORMAL LOW (ref 1.7–2.4)
Magnesium: 1.6 mg/dL — ABNORMAL LOW (ref 1.7–2.4)

## 2016-02-25 LAB — CBC
HEMATOCRIT: 27 % — AB (ref 39.0–52.0)
Hemoglobin: 9.4 g/dL — ABNORMAL LOW (ref 13.0–17.0)
MCH: 27.6 pg (ref 26.0–34.0)
MCHC: 34.8 g/dL (ref 30.0–36.0)
MCV: 79.2 fL (ref 78.0–100.0)
Platelets: 371 10*3/uL (ref 150–400)
RBC: 3.41 MIL/uL — ABNORMAL LOW (ref 4.22–5.81)
RDW: 13.7 % (ref 11.5–15.5)
WBC: 31.1 10*3/uL — AB (ref 4.0–10.5)

## 2016-02-25 LAB — I-STAT TROPONIN, ED: TROPONIN I, POC: 0.01 ng/mL (ref 0.00–0.08)

## 2016-02-25 LAB — TSH: TSH: 2.206 m(IU)/L (ref 0.320–4.118)

## 2016-02-25 LAB — TECHNOLOGIST REVIEW

## 2016-02-25 LAB — TROPONIN I: TROPONIN I: 0.06 ng/mL — AB (ref <0.03)

## 2016-02-25 MED ORDER — HYDROMORPHONE HCL 2 MG/ML IJ SOLN
0.5000 mg | INTRAMUSCULAR | Status: DC | PRN
  Administered 2016-02-25 – 2016-03-02 (×14): 0.5 mg via INTRAVENOUS
  Filled 2016-02-25 (×14): qty 1

## 2016-02-25 MED ORDER — SODIUM CHLORIDE 0.9 % IV SOLN
Freq: Once | INTRAVENOUS | Status: AC
Start: 2016-02-25 — End: 2016-02-26
  Administered 2016-02-25: 150 mL/h via INTRAVENOUS

## 2016-02-25 MED ORDER — SODIUM CHLORIDE 0.9% FLUSH
3.0000 mL | Freq: Two times a day (BID) | INTRAVENOUS | Status: DC
  Administered 2016-02-25 – 2016-03-07 (×14): 3 mL via INTRAVENOUS

## 2016-02-25 MED ORDER — ZOLEDRONIC ACID 4 MG/5ML IV CONC
4.0000 mg | Freq: Once | INTRAVENOUS | Status: AC
  Administered 2016-02-25: 4 mg via INTRAVENOUS
  Filled 2016-02-25: qty 5

## 2016-02-25 MED ORDER — ONDANSETRON HCL 4 MG PO TABS: 4.0000 mg | ORAL_TABLET | Freq: Four times a day (QID) | ORAL | Status: DC | PRN

## 2016-02-25 MED ORDER — HYDROCODONE-ACETAMINOPHEN 5-325 MG PO TABS
1.0000 | ORAL_TABLET | ORAL | Status: DC | PRN
  Administered 2016-02-26 – 2016-02-27 (×2): 2 via ORAL
  Filled 2016-02-25 (×2): qty 2

## 2016-02-25 MED ORDER — CALCITONIN (SALMON) 200 UNIT/ML IJ SOLN
280.0000 [IU] | Freq: Two times a day (BID) | INTRAMUSCULAR | Status: DC
  Administered 2016-02-26 – 2016-03-02 (×8): 280 [IU] via INTRAMUSCULAR
  Filled 2016-02-25 (×11): qty 1.4

## 2016-02-25 MED ORDER — ONDANSETRON HCL 4 MG/2ML IJ SOLN: 4.0000 mg | Freq: Four times a day (QID) | INTRAMUSCULAR | Status: DC | PRN

## 2016-02-25 MED ORDER — HEPARIN SODIUM (PORCINE) 5000 UNIT/ML IJ SOLN
5000.0000 [IU] | Freq: Three times a day (TID) | INTRAMUSCULAR | Status: DC
  Administered 2016-02-25 – 2016-03-06 (×25): 5000 [IU] via SUBCUTANEOUS
  Filled 2016-02-25 (×25): qty 1

## 2016-02-25 MED ORDER — CALCITONIN (SALMON) 200 UNIT/ML IJ SOLN
280.0000 [IU] | Freq: Once | INTRAMUSCULAR | Status: AC
  Administered 2016-02-25: 280 [IU] via INTRAMUSCULAR
  Filled 2016-02-25: qty 1.4

## 2016-02-25 MED ORDER — SODIUM CHLORIDE 0.9 % IV BOLUS (SEPSIS)
2000.0000 mL | Freq: Once | INTRAVENOUS | Status: AC
  Administered 2016-02-25: 2000 mL via INTRAVENOUS

## 2016-02-25 MED ORDER — SODIUM CHLORIDE 0.9 % IV SOLN
INTRAVENOUS | Status: DC
  Administered 2016-02-25 – 2016-02-26 (×2): via INTRAVENOUS

## 2016-02-25 NOTE — ED Notes (Signed)
Pt difficult to arouse. Refuses to answer questions.

## 2016-02-25 NOTE — ED Provider Notes (Addendum)
[place] [facility] DEPT Provider Note   CSN: [id number] Arrival date & time: 02/25/16  1516     History   Chief Complaint Chief Complaint  Patient presents with  . Abnormal Lab    HPI Johnny Mooney is a 46 y.o. male.  HPI  Level 5 caveat for confusion. LEVEL 5 CAVEAT - PT IS COMATOSE Pt comes in with cc of weakness. 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 Pt had radiation tx today. Pt's girl friend and mother giving history. They report that pt was doing fine on New Years eve, but since then he has declined. Pt is no longer eating or drinking, and he is profoundly weak where the girl friend has been taking complete care of him. Pt went to his radiation therapy and was asked to come to the ER. Pt has had no falls. There is no fevers. + diarrhea on ROS.   Past Medical History:  Diagnosis Date  . Cancer ([place]) 11/2015   sq cell skin  . Cellulitis of left lower extremity    dx 09/ 2017 at ED took antibiotic per is healing  . History of traumatic head injury    closed -- no residual  . Subcutaneous mass of head    RIGHT POSTERIOR SCALP--  I&D at ED 09-16-2015 and 10-10-2015    Patient Active Problem List   Diagnosis Date Noted  . Hypercalcemia of malignancy 02/25/2016  . Hypercalcemia 02/25/2016  . AKI (acute kidney injury) ([place]) 02/25/2016  . 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 Tobacco abuse 02/20/2012    Past Surgical History:  Procedure Laterality Date  . EXCISION MASS HEAD N/A 11/06/2015   Procedure: EXCISION MASS HEAD;  Surgeon: Mickeal Skinner, MD;  Location: [facility];  Service: General;  Laterality: N/A;  . LYMPH NODE BIOPSY N/A 01/07/2016   Procedure: EXCISIONAL LYMPH NODE BIOPSY;  Surgeon: [name], MD;  [facility];  Service: General;  Laterality: N/A;  . MASS EXCISION N/A 01/07/2016   Procedure: EXCISION OF SCALP MASS;  Surgeon: [name], MD;  [facility];  Service: General;   Laterality: N/A;       Home Medications    Prior to Admission medications   Medication Sig Start Date End Date Taking? Authorizing Provider  ibuprofen (ADVIL,MOTRIN) 800 MG tablet Take 1 tablet (800 mg total) by mouth every 8 (eight) hours as needed. 02/02/16  Yes [name], MD  ondansetron (ZOFRAN-ODT) 4 MG disintegrating tablet Take 1 tablet (4 mg total) by mouth every 6 (six) hours as needed for nausea. 02/02/16  Yes [name], MD  oxyCODONE (OXY IR/ROXICODONE) 5 MG immediate release tablet Take 1 tablet (5 mg total) by mouth every 4 (four) hours as needed for severe pain. 02/02/16  Yes Mickeal Skinner, MD    Family History Family History  Problem Relation Age of Onset  . Diabetes Mellitus II Mother   . Asthma Father     Social History Social History  Substance Use Topics  . Smoking status: Current Every Day Smoker    Packs/day: 0.33    Years: 5.00    Types: Cigars  . Smokeless tobacco: Never Used     Comment: quit cigarette smoking 2013, currently smokes 2 cigars daily  . Alcohol use 1.0 oz/week    2 Standard drinks or equivalent per week     Comment: couple of beers on the weekends     Allergies  Patient has no known allergies.   Review of Systems Review of Systems  ROS 10 Systems reviewed and are negative for acute change except as noted in the HPI.     Physical Exam Updated Vital Signs BP 120/96   Pulse 97   Temp 98.4 F (36.9 C) (Oral)   Resp 16   Ht 5\' 11"  (1.803 m)   Wt 155 lb (70.3 kg)   SpO2 96%   BMI 21.62 kg/m   Physical Exam  Constitutional: He appears well-developed.  HENT:  Head: Atraumatic.  Eyes: Pupils are equal, round, and reactive to light.  Neck: Neck supple.  Cardiovascular: Normal rate and regular rhythm.   Pulmonary/Chest: Effort normal.  Abdominal: Soft. Bowel sounds are normal. He exhibits no distension. There is no tenderness.  Neurological:  Somnolent, low tone, poor reflex bilaterally  Skin:  Skin is warm.  Nursing note and vitals reviewed.    ED Treatments / Results  Labs (all labs ordered are listed, but only abnormal results are displayed) Labs Reviewed  [place], ED    EKG  EKG Interpretation None     ED ECG REPORT   Date: 02/25/2016  Rate: 94  Rhythm: normal sinus rhythm  QRS Axis: normal  Intervals: normal  ST/T Wave abnormalities: ST elevations anteriorly and ST elevations laterally  Conduction Disutrbances:none  Narrative Interpretation:   Old EKG Reviewed: none available  I have personally reviewed the EKG tracing and agree with the computerized printout as noted.    Date: 02/25/2016  Rate: 89  Rhythm: normal sinus rhythm  QRS Axis: normal  Intervals: normal  ST/T Wave abnormalities: ST elevations anteriorly and ST elevations laterally  Conduction Disutrbances:none  Narrative Interpretation:   Old EKG Reviewed: none available  I have personally reviewed the EKG tracing and agree with the computerized printout as noted.    Radiology No results found.  Procedures Procedures (including critical care time)  CRITICAL CARE Performed by: Varney Biles   Total critical care time: 45 minutes  Critical care time was exclusive of separately billable procedures and treating other patients.  Critical care was necessary to treat or prevent imminent or life-threatening deterioration.  Critical care was time spent personally by me on the following activities: development of treatment plan with patient and/or surrogate as well as nursing, discussions with consultants, evaluation of patient's response to treatment, examination of patient, obtaining history from patient or surrogate, ordering and performing treatments and interventions, ordering and review of laboratory studies, ordering and review of radiographic studies, pulse oximetry and re-evaluation of patient's condition.   Medications  Ordered in ED Medications  0.9 %  sodium chloride infusion (not administered)  sodium chloride 0.9 % bolus 2,000 mL (2,000 mLs Intravenous New Bag/Given 02/25/16 1724)  calcitonin (MIACALCIN) injection 280 Units (280 Units Intramuscular Given 02/25/16 1817)     Initial Impression / Assessment and Plan / ED Course  I have reviewed the triage vital signs and the nursing notes.  Pertinent labs & imaging results that were available during my care of the patient were reviewed by me and considered in my medical decision making (see chart for details).  Clinical Course as of Feb 25 1932  Thu Feb 25, 2016  1826 I had to order EKG again, and was presented an EKG that shows J waves, short QTc and ST elevation in the inferior and lateral waves. Pt reassessed and he is now talking. He is feeling constipated  -  but has no chest pain. Repeat EKG ordered.  [AN]  1836 Repeat ekg unchanged. Trops ordered. Will speak with Cardiology to see if pt needs consultation.  [AN]  61 Spoke with Dr. Radford Pax. Closer literature review shows that profound hypercalcemia can cause ST elevation that mimic MI. EKG x 2 are neg. No need for cards involvement. Trops pending  [AN]    Clinical Course User Index [AN] Varney Biles, MD    Pt comes in with altered mental status. He has hx of neck CA and his labs show HyperCA - which is likely related to malignancy. Also pt has AKI and uremia - which go with the poor po intake. Family informed. We will admit to medicine. Fluid started. Calcitonin given.  Final Clinical Impressions(s) / ED Diagnoses   Final diagnoses:  AKI (acute kidney injury) ([place])  Hypercalcemia  Uremia    New Prescriptions New Prescriptions   No medications on file     Varney Biles, MD 02/25/16 [place], MD 02/25/16 Russell, MD 02/25/16 [id number]

## 2016-02-25 NOTE — ED Notes (Signed)
Will give report at bedside.

## 2016-02-25 NOTE — ED Notes (Signed)
Pt refused EKG prior to now.

## 2016-02-25 NOTE — Progress Notes (Signed)
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 He is a patient of [name].  Patient presented in October 2017 with throbbing pain and an enlarging mass in posterior scalp. Several subsequent attempts were made to drain the mass with minimal relief. Biopsy of the mass revealed invasive squamous cell carcinoma of posterior scalp soft tissue mass, as well as posterior lymph nodes positive for poorly differentiated squamous cell carcinoma. Resection was attempted by general surgery late last year, but once the extent of disease was determined in the OR, the operation was aborted.  Past medical history includes cellulitis, and tobacco.  Medications include Zofran.  Labs include BUN 72.3, creatinine 2.2, albumin 3.0, and calcium greater than 16.  Height: 5 feet 11 inches. Weight: 155.2 pounds January 26. Usual body weight: 180 pounds, 01/07/2016. BMI: 21.65.  I attempted to speak with patient however patient was unable to provide any history.  Girlfriend reports patient will not allow her to help him or clean him up after bowel movement.  He refuses to eat or drink.  Physical exam was deferred.  Plan is to attempt to get patient admission after radiation therapy today.  Nutrition diagnosis: Severe malnutrition related to stage IV cancer as evidenced by 14% weight loss in less than 3 months and less than 50% of energy intake for greater than or equal to 5 days.  Intervention: Provided support and encouragement to girlfriend.  Provided basic fact sheets. I will communicate with inpatient RD regarding patient's admission.  Patient has increased nutritional needs. Expect patient to exhibit signs of refeeding syndrome when patient begins to take oral intake.  Monitoring, evaluation, goals: Patient will tolerate increased oral intake to promote repletion.  Next visit: To be scheduled.  **Disclaimer:  This note was dictated with voice recognition software. Similar sounding words can inadvertently be transcribed and this note may contain transcription errors which may not have been corrected upon publication of note.**

## 2016-02-25 NOTE — ED Triage Notes (Addendum)
Pt presents from the cancer with c/o generalized pain and abnormal lab. Pt is being treated for a scalp carcinoma and was at the cancer center for treatment today when he was found to have a critical calcium level. Pt reports that his pain is all over and that it is a 12. Pt has not been able to eat or drink anything recently either. Pt's family remember reports that he has also recently had episodes of cognitive impairment and has not been able to properly perform his ADL's.

## 2016-02-25 NOTE — ED Notes (Addendum)
Pt refusing blood draw at this time. He asked staff to come back in a few minutes. This is the second time he has asked staff to return

## 2016-02-25 NOTE — Progress Notes (Signed)
Oncology Nurse Navigator Documentation  Met Mr. Drone in [facility] lobby to transport him after labs to RadOnc for [facility] RT.  He was in wheelchair, accompanied by his [place], his mother later arrived. He appeared lethargic, very soft spoken. Assisted with [facility] RT, he managed procedure without incident/difficulty. I expressed concern about reported lack of nutritional intake, hydration, diarrhea, ongoing weakness; emphasized importance of nutrition/hydration to healing as he proceeds with radiation. He agreed to ED visit in context of above and concerning lab results just received.  RN Anderson Malta spoke with Charge Nurse to make him aware of arrival. I escorted him to [facility] ED, provided report to admitting RN.  Gayleen Orem, RN, BSN, [place] [facility]ck Oncology Nurse [place] at [place] [phone]

## 2016-02-25 NOTE — H&P (Signed)
History and Physical    Johnny Mooney [id number] DOB: 01-20-1971 DOA: 02/25/2016  PCP: No PCP Per Patient  Patient coming from: Home  Chief Complaint: decreased oral intake   HPI: Johnny Mooney is a 46 y.o. 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 History is mainly gathered from mother and ED notes. He has apparently had decreased food and liquid intake over the past 2-3 days. He has refused to come into the ED for assistance. He was seen at radiation appointment today and was urged to be evaluated in the ED . He currently complains of pain all over, esp in his abdomen due to constipation. He does not readily participate in conversation but is able to tell me that he feels chilled, no chest pain or shortness of breath, has had episode of productive cough yesterday, and admits to nausea, feels like he is getting a stomach ulcer, constipation, decreased urination, no peripheral edema.    ED Course: Patient was found to have Ca > 16. Started in IVF and given calcitonin. TRH asked for admission.   Review of Systems: As per HPI otherwise 10 point review of systems negative.   Past Medical History:  Diagnosis Date  . Cancer ([place]) 11/2015   sq cell skin  . Cellulitis of left lower extremity    dx 09/ 2017 at ED took antibiotic per is healing  . History of traumatic head injury    closed -- no residual  . Subcutaneous mass of head    RIGHT POSTERIOR SCALP--  I&D at ED 09-16-2015 and 10-10-2015    Past Surgical History:  Procedure Laterality Date  . EXCISION MASS HEAD N/A 11/06/2015   Procedure: EXCISION MASS HEAD;  Surgeon: Mickeal Skinner, MD;  Location: [facility];  Service: General;  Laterality: N/A;  . LYMPH NODE BIOPSY N/A  01/07/2016   Procedure: EXCISIONAL LYMPH NODE BIOPSY;  Surgeon: [name], MD;  [facility];  Service: General;  Laterality: N/A;  . MASS EXCISION N/A 01/07/2016   Procedure: EXCISION OF SCALP MASS;  Surgeon: [name], MD;  [facility];  Service: General;  Laterality: N/A;     reports that he has been smoking Cigars.  He has a 1.65 pack-year smoking history. He has never used smokeless tobacco. He reports that he drinks about 1.0 oz of alcohol per week . He reports that he does not use drugs.  No Known Allergies  Family History  Problem Relation Age of Onset  . Diabetes Mellitus II Mother   . Asthma Father     Prior to Admission medications   Medication Sig Start Date End Date Taking? Authorizing Provider  ibuprofen (ADVIL,MOTRIN) 800 MG tablet Take 1 tablet (800 mg total) by mouth every 8 (eight) hours as needed. 02/02/16  Yes [name], MD  ondansetron (ZOFRAN-ODT) 4 MG disintegrating tablet Take 1 tablet (4 mg total) by mouth every 6 (six) hours as needed for nausea. 02/02/16  Yes [name], MD  oxyCODONE (OXY IR/ROXICODONE) 5 MG immediate release tablet Take 1 tablet (5 mg total) by mouth every 4 (four) hours as needed for severe pain. 02/02/16  Yes Mickeal Skinner, MD    Physical Exam: Vitals:   02/25/16 1526 02/25/16 1533  BP:  116/86  Pulse:  99  Resp:  16  Temp:  98.4 F (36.9 C)  TempSrc:  Oral  SpO2:  100%  Weight: 70.3 kg (155 lb)   Height: 5\' 11"  (1.803 m)     Constitutional: NAD, calm, appears uncomfortable  Eyes: PERRL, lids and conjunctivae with injection  ENMT: Mucous membranes are dry. +posterior scalp squamous cell cancer is large and protruding, currently covered with clean gauze and patient refusing me to examine  Neck: normal, supple, no masses, no thyromegaly Respiratory: clear to auscultation bilaterally, no wheezing, no crackles. Normal respiratory effort. No accessory muscle use.  Cardiovascular:  Regular rate and rhythm, no murmurs / rubs / gallops. No extremity edema. 2+ pedal pulses.  Abdomen: +tender diffusely, +soft, No hepatosplenomegaly. Bowel sounds hypoactive.  Musculoskeletal: no clubbing / cyanosis. No joint deformity upper and lower extremities. Good ROM, no contractures. Normal muscle tone.  Skin: no rashes, lesions, ulcers on exposed skin  Neurologic: CN 2-12 grossly intact. Sensation intact, DTR normal. Strength 5/5 in all 4.  Psychiatric: Judgment and insight poor. Alert and oriented x 3.   Labs on Admission: I have personally reviewed following labs and imaging studies  CBC:  Recent Labs Lab 02/25/16 1339  WBC 38.3*  NEUTROABS 32.7*  HGB 11.7*  HCT 35.4*  MCV 80.8  PLT [id number]*   Basic Metabolic Panel:  Recent Labs Lab 02/25/16 1339  NA 138  K 3.5  CO2 30*  GLUCOSE 118  BUN 72.3*  CREATININE 2.2*  CALCIUM >16.0*   GFR: Estimated Creatinine Clearance: 43 mL/min (by C-G formula based on SCr of 2.2 mg/dL (H)). Liver Function Tests:  Recent Labs Lab 02/25/16 1339  AST 61*  ALT 38  ALKPHOS 194*  BILITOT 0.43  PROT 9.1*  ALBUMIN 3.0*   No results for input(s): LIPASE, AMYLASE in the last 168 hours. No results for input(s): AMMONIA in the last 168 hours. Coagulation Profile: No results for input(s): INR, PROTIME in the last 168 hours. Cardiac Enzymes: No results for input(s): CKTOTAL, CKMB, CKMBINDEX, TROPONINI in the last 168 hours. BNP (last 3 results) No results for input(s): PROBNP in the last 8760 hours. HbA1C: No results for input(s): HGBA1C in the last 72 hours. CBG: No results for input(s): GLUCAP in the last 168 hours. Lipid Profile: No results for input(s): CHOL, HDL, LDLCALC, TRIG, CHOLHDL, LDLDIRECT in the last 72 hours. Thyroid Function Tests:  Recent Labs  02/25/16 1339  TSH 2.206   Anemia Panel: No results for input(s): VITAMINB12, FOLATE, FERRITIN, TIBC, IRON, RETICCTPCT in the last 72 hours. Urine analysis:      Component Value Date/Time   COLORURINE YELLOW 02/20/2012 1200   APPEARANCEUR CLEAR 02/20/2012 1200   LABSPEC 1.021 02/20/2012 1200   PHURINE 6.5 02/20/2012 1200   GLUCOSEU NEGATIVE 02/20/2012 1200   HGBUR NEGATIVE 02/20/2012 1200   BILIRUBINUR NEGATIVE 02/20/2012 1200   KETONESUR NEGATIVE 02/20/2012 1200   PROTEINUR NEGATIVE 02/20/2012 1200   UROBILINOGEN 1.0 02/20/2012 1200   NITRITE NEGATIVE 02/20/2012 1200   LEUKOCYTESUR NEGATIVE 02/20/2012 1200   Sepsis Labs: !!!!!!!!!!!!!!!!!!!!!!!!!!!!!!!!!!!!!!!!!!!! @LABRCNTIP (procalcitonin:4,lacticidven:4) ) Recent Results (from the past 240 hour(s))  TECHNOLOGIST REVIEW     Status: None   Collection Time: 02/25/16  1:39 PM  Result Value Ref Range Status   Technologist Review few target cells  Final     Radiological Exams on Admission: No results found.  Assessment/Plan Principal Problem:   Hypercalcemia of malignancy Active Problems:   Squamous cell carcinoma of neck   Hypercalcemia   AKI (acute  kidney injury) ([place])  Hypercalcemia of malignancy -IVF and calcitonin 4IU/kg IM q12h, zoledronic acid 4mg  IV once  -Trend BMP, Mg  -Strict I/Os   Acute metabolic encephalopathy -Likely secondary to hypercalcemia, uremia  -Monitor    AKI  -Baseline Cr around 0.7-0.8 -Due to pre-renal azotemia, dehydration  -IVF as above -Check UA  -Trend BMP   Metastatic squamous cell carcinoma of head/scalp, stage IV -Follows with radiation oncology  -Pain control    DVT prophylaxis: heparin subq  Code Status: Full  Family Communication: mom at bedside Disposition Plan: pending further improvement, stabilization Consults called: none   Admission status: Inpatient to telemetry    Dessa Phi, DO [facility] [email or web address] Password [email or web address] 02/25/2016, 5:55 PM

## 2016-02-25 NOTE — ED Notes (Addendum)
Pt requesting to use the bedpan before fluids started.

## 2016-02-25 NOTE — ED Notes (Signed)
Pt allowed MD in to room

## 2016-02-25 NOTE — ED Notes (Signed)
This writer attempted to draw blood from this pt, as soon as this Probation officer placed tourniquet and was about to stick pt, pt stated he needed to use the bedpan and that this writer needed to leave the room. This Probation officer then attempted to assist pt off from the bedpan, to which pt refused stating "I want my nurse".

## 2016-02-25 NOTE — Telephone Encounter (Signed)
Oncology Nurse Navigator Documentation  Received call from Mr. Johnny Mooney, Johnny Mooney.  She reported Mr. Johnny Mooney refused EMT assistance to the ED last HS per earlier guidance from [employer].  Later, she and patient friend brought him to the ED ("we had to trick him") but he refused to enter.  Consequently, Johnny Mooney locked her out of the house last HS.  She reiterated information she called to [employer] yesterday afternoon, including:  He is refusing to eat/drink, having bouts of diarrhea, very weak, refusing hygiene, "cognitively in and out", "shaking violently".    She has not talked with or seen him this morning but is enroute to his home to bring him to this afternoon's RT appt.  She noted he stated yesterday he would come to this appt. I asked her to call me when they arrive to [facility].  She agreed.  Gayleen Orem, RN, BSN, [place] Oncology Nurse [place] at [place] [phone]

## 2016-02-25 NOTE — ED Notes (Signed)
Pt refuses to allow this RN to help him. I was able to get the bed pan out from under him with his permission, but he wont allow me to clean him up. The MD is waiting outside the door and every time she tries to go in, he requests privacy and states that he doesn't want to be treated this way. Pt mom currently talking to pt.

## 2016-02-25 NOTE — ED Notes (Signed)
Attempted to medicate patient now that med has arrived from pharmacy. Pt states that we cannot do anything to him until he is on the bedpan.

## 2016-02-25 NOTE — ED Notes (Signed)
Pt refused blood draw from this writer for a second time.

## 2016-02-26 ENCOUNTER — Encounter: Payer: Self-pay | Admitting: *Deleted

## 2016-02-26 ENCOUNTER — Ambulatory Visit: Admission: RE | Admit: 2016-02-26 | Payer: Medicaid Other | Source: Ambulatory Visit

## 2016-02-26 ENCOUNTER — Ambulatory Visit
Admission: RE | Admit: 2016-02-26 | Discharge: 2016-02-26 | Disposition: A | Discharge status: Home / Self Care | Payer: Medicaid Other | Source: Ambulatory Visit | Attending: Radiation Oncology | Admitting: Radiation Oncology

## 2016-02-26 LAB — CBC
HCT: 25.1 % — ABNORMAL LOW (ref 39.0–52.0)
HEMOGLOBIN: 8.6 g/dL — AB (ref 13.0–17.0)
MCH: 27.3 pg (ref 26.0–34.0)
MCHC: 34.3 g/dL (ref 30.0–36.0)
MCV: 79.7 fL (ref 78.0–100.0)
PLATELETS: 361 10*3/uL (ref 150–400)
RBC: 3.15 MIL/uL — AB (ref 4.22–5.81)
RDW: 13.9 % (ref 11.5–15.5)
WBC: 26.6 10*3/uL — AB (ref 4.0–10.5)

## 2016-02-26 MED ORDER — POLYETHYLENE GLYCOL 3350 17 G PO PACK
17.0000 g | PACK | Freq: Three times a day (TID) | ORAL | Status: DC
  Administered 2016-02-26 – 2016-03-06 (×6): 17 g via ORAL
  Filled 2016-02-26 (×16): qty 1

## 2016-02-26 MED ORDER — POLYETHYLENE GLYCOL 3350 17 G PO PACK: 17.0000 g | PACK | Freq: Two times a day (BID) | ORAL | Status: DC

## 2016-02-26 MED ORDER — SODIUM CHLORIDE 0.9 % IV SOLN
INTRAVENOUS | Status: DC
  Administered 2016-02-26: 1000 mL via INTRAVENOUS
  Administered 2016-02-26 – 2016-02-27 (×3): via INTRAVENOUS

## 2016-02-26 MED ORDER — MAGNESIUM SULFATE 2 GM/50ML IV SOLN
2.0000 g | Freq: Once | INTRAVENOUS | Status: AC
  Administered 2016-02-26: 2 g via INTRAVENOUS
  Filled 2016-02-26: qty 50

## 2016-02-26 NOTE — Progress Notes (Signed)
Oncology Nurse Navigator Documentation  Visited Mr. Johnny Mooney [id number] to check on his well-being.  He was asleep, I spoke with girlfriend [name]. She expressed concern:  He has had minimal nutritional intake, asked about PEG tube.  She voiced understanding of need for improved nutrition as he undergoes RT.  I encouraged her to speak to his physician and nutritionist in this regard.  Regarding his ability to be care for himself or others if he is discharged home.  He has been sleeping a lot, is weak, balance unsteady, needs assistance with ADLs; she is unable to safely provide this at home.  I encouraged he to request SNF placement when she speaks with his physician. She understands I can be contacted further concerns, that I will continue to follow during this admission.  Gayleen Orem, RN, BSN, [place] [facility] Nurse [place] [facility] [place] [phone]

## 2016-02-26 NOTE — Progress Notes (Signed)
Patient started radiotherapy on Thursday 02-25-16. The radiation team, including myself, is concerned that patient is extremely malnourished, weak, lethargic. Intake has been poor for a long time per family. I recommend that he is initiated with PEG /  tube feedings ASAP. And, consider SNF placement upon eventual discharge. -----------------------------------  Eppie Gibson, MD

## 2016-02-26 NOTE — Progress Notes (Signed)
Wife requested for wound consult  For patient's R posterior scalp wants it ASAP,MD aware and order placed.

## 2016-02-26 NOTE — Progress Notes (Signed)
Wife notified re: patient's  radiation appointment at 1400,verbalized understanding.

## 2016-02-26 NOTE — Progress Notes (Signed)
[facility] PROGRESS NOTE    Progress Note  Johnny Mooney  [id number] DOB: [birth year] DOA: 02/25/2016 PCP: No PCP Per Patient     Brief Narrative:   Johnny Mooney is an 46 y.o. male past medical history of squamous cell skin cancer stage IV he was seen by radiation oncology appointment and was urged to come to the ED due to constipation, decreased oral intake for the past several days, in the ED he was found to have a calcium of greater than 16 and acute kidney injury.  Assessment/Plan:   Hypercalcemia of malignancy: He was started on IV fluids calcitonin and zoledronic acid. His calcium today is 12.6, continue calcitonin and IV fluid hydration. Check calcium in am.  Acute encephalopathy: Resolved, the patient does not wish to talk to anybody.  Acute kidney injury: With baseline creatinine of less than 1, likely due to renal azotemia. Likely pre-renal.  Constipation: Hypercalcemia started on MiraLAX 3 times a day.  Squamous cell carcinoma of neck Follow-up with radiation oncology as an outpatient. Continue current pain regimen.  Hypomagnesemia: Replete magnesium checked in the morning.   DVT prophylaxis: heparin Family Communication:none Disposition Plan/Barrier to D/C: home in 1-2 days Code Status:     Code Status Orders        Start     Ordered   02/25/16 2224  Full code  Continuous     02/25/16 2223    Code Status History    Date Active Date Inactive Code Status Order ID Comments User Context   01/28/2016 10:31 PM 02/02/2016  9:29 PM Full Code [id number]  Alphonsa Overall, MD Inpatient   02/20/2012 12:59 AM 02/22/2012  3:31 PM Full Code [id number]  Rise Patience, MD Inpatient        IV Access:    Peripheral IV   Procedures and diagnostic studies:   No results found.   Medical Consultants:    None.  Anti-Infectives:   None  Subjective:    [name] pt refuses to talk  Objective:    Vitals:   02/25/16 1942 02/25/16 2000 02/25/16 2203 02/26/16 0552  BP: 124/88 127/90 118/85 110/62  Pulse: 88  95 (!) 107  Resp: 16 14  18   Temp:   98.1 F (36.7 C) 98.8 F (37.1 C)  TempSrc:   Oral Axillary  SpO2: 96%  100% 97%  Weight:      Height:        Intake/Output Summary (Last 24 hours) at 02/26/16 0830 Last data filed at 02/26/16 0600  Gross per 24 hour  Intake             3135 ml  Output             1200 ml  Net             1935 ml   Filed Weights   02/25/16 1526  Weight: 70.3 kg (155 lb)    Exam: General exam: In no acute distress. Respiratory system: refused physical exam  Data Reviewed:    Labs: Basic Metabolic Panel:  Recent Labs Lab 02/25/16 1339 02/25/16 2051 02/25/16 2305  NA 138 137 136  K 3.5 3.2* 3.1*  CL  --  100* 100*  CO2 30* 28 28  GLUCOSE 118 98 97  BUN 72.3* 64* 62*  CREATININE 2.2* 1.80* 1.74*  CALCIUM >16.0* 13.2* 12.6*  MG  --  1.6* 1.6*   GFR Estimated Creatinine Clearance: 54.4 mL/min (by C-G formula based  on SCr of 1.74 mg/dL (H)). Liver Function Tests:  Recent Labs Lab 02/25/16 1339  AST 61*  ALT 38  ALKPHOS 194*  BILITOT 0.43  PROT 9.1*  ALBUMIN 3.0*   No results for input(s): LIPASE, AMYLASE in the last 168 hours. No results for input(s): AMMONIA in the last 168 hours. Coagulation profile No results for input(s): INR, PROTIME in the last 168 hours.  CBC:  Recent Labs Lab 02/25/16 1339 02/25/16 2046 02/25/16 2305 02/26/16 0554  WBC 38.3* 32.6* 31.1* 26.6*  NEUTROABS 32.7* 28.2*  --   --   HGB 11.7* 9.7* 9.4* 8.6*  HCT 35.4* 28.8* 27.0* 25.1*  MCV 80.8 78.5 79.2 79.7  PLT 414* 394 371 361   Cardiac Enzymes:  Recent Labs Lab 02/25/16 2046  TROPONINI 0.06*   BNP (last 3 results) No results for input(s): PROBNP in the last 8760 hours. CBG: No results for input(s): GLUCAP in the last 168 hours. D-Dimer: No results for input(s): DDIMER in the last 72 hours. Hgb A1c: No results for input(s): HGBA1C in  the last 72 hours. Lipid Profile: No results for input(s): CHOL, HDL, LDLCALC, TRIG, CHOLHDL, LDLDIRECT in the last 72 hours. Thyroid function studies:  Recent Labs  02/25/16 1339  TSH 2.206   Anemia work up: No results for input(s): VITAMINB12, FOLATE, FERRITIN, TIBC, IRON, RETICCTPCT in the last 72 hours. Sepsis Labs:  Recent Labs Lab 02/25/16 1339 02/25/16 2046 02/25/16 2305 02/26/16 0554  WBC 38.3* 32.6* 31.1* 26.6*   Microbiology Recent Results (from the past 240 hour(s))  TECHNOLOGIST REVIEW     Status: None   Collection Time: 02/25/16  1:39 PM  Result Value Ref Range Status   Technologist Review few target cells  Final     Medications:   . calcitonin  280 Units Intramuscular Q12H  . heparin  5,000 Units Subcutaneous Q8H  . sodium chloride flush  3 mL Intravenous Q12H   Continuous Infusions: . sodium chloride 150 mL/hr at 02/26/16 0501    Time spent: 25 min   LOS: 1 day   [name]  [facility] Pager [phone]  *Please refer to [email or web address], password [email or web address] to get updated schedule on who will round on this patient, as hospitalists switch teams weekly. If 7PM-7AM, please contact night-coverage at [email or web address], password [email or web address] for any overnight needs.  02/26/2016, 8:30 AM

## 2016-02-26 NOTE — Progress Notes (Signed)
Initial Nutrition Assessment  DOCUMENTATION CODES:   Severe malnutrition in context of acute illness/injury  INTERVENTION:  - Monitor phosphorous, magnesium, and potassium daily for at least 3 days. MD to replete as needed, as pt is at risk for refeeding syndrome - If PEG placement is within [place], TF recommendations of starting with Jevity 1.2 @ 20 mL increasing by 10 mL every 12 hrs to reach goal rate of Jevity 1.2 @ 70 mL/hr + Pro-stat BID, which would equate to 2216 calories and 123 grams protein (100% of needs met)   NUTRITION DIAGNOSIS:   Malnutrition related to acute illness, cancer and cancer related treatments as evidenced by energy intake < or equal to 50% for > or equal to 5 days, percent weight loss.  GOAL:   Patient will meet greater than or equal to 90% of their needs  MONITOR:   PO intake, Weight trends, I & O's, Skin  REASON FOR ASSESSMENT:   Malnutrition Screening Tool    ASSESSMENT:   46 y.o. 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  Pt has a BMI of 21.7, normal. Attempted to speak with pt but he was unable to provide history.  Reviewed [place] RD note to gather additional information.  Pt is undergoing radiation and his next treatment is at 1400 today.  Pt's girlfriend reports pt has had decreased po intake. She reports the consumption of 1/2 a peanut butter sandwich 9 days ago, few bites of pear and orange 5 days ago and that is all he has consumed. This equates to less than 50% of his energy intake.   Pt's girlfriend also reports wt loss. Per chart review pt weighed 180 lb 12/14, and has a current wt of 155 lbs. This is a 14% wt loss in less than 2 months (significant for time frame).   Pt's girlfriend reported pt needs assistance  with ADLs and she is "there for him 24/7".  Pt's girlfriend stated pt's mouth was very dry upon admission and he currently receiving NS @ 150 mL/hr. Will monitor wt closely once pt is rehydrated.   Pt's girlfriend stated they have an abundance of Ensure at home but pt will not consume at this time.   Nutrition focused physical exam was not appropriate at the time of visit.   Pt's girlfriend expressed wanting the placement of feeding tube. If PEG placement is within [place], recommendations would include starting with Jevity 1.2 @ 20 mL increasing by 10 mL every 12 hrs to reach goal rate of Jevity 1.2 @ 70 mL/hr + Pro-stat BID, which would equate to 2216 calories and 123 grams protein (100% of needs met)   Labs reviewed; Ca (12.6), BUN (62), Creatinine (1.74), Hemoglobin (8.6), HCT (25.1) Medications reviewed; 280 units Calcitonin, 2 g Magnesium sulfate, Miralax BID  Diet Order:  Diet regular Room service appropriate? Yes; Fluid consistency: Thin  Skin:  Wound (see comment) (Right Head Abscess)  Last BM:  2/1  Height:   Ht Readings from Last 1 Encounters:  02/25/16 5' 11"  (1.803 m)    Weight:   Wt Readings from Last 1 Encounters:  02/25/16 155 lb (70.3 kg)    Ideal Body Weight:  78 kg  BMI:  Body mass index is 21.62 kg/m.  Estimated Nutritional Needs:   Kcal:  2100-2300 (30-33 kcal/kg)  Protein:  115-125 (1.6-1.8 g/kg)  Fluid:  >/= 2 L/d  EDUCATION NEEDS:   Education needs no appropriate at this time  [employer] Intern

## 2016-02-26 NOTE — Progress Notes (Signed)
Per wife ,patient had diarrhea yesterday, refused Miralax to be given to patient. Explained that patient is currently taking pain meds and needed the Miralax to prevent constipation,she said she understood it.

## 2016-02-26 NOTE — Consult Note (Signed)
[place] Nurse wound consult note Reason for Consult: Neoplastic lesion on right neck with full thickness wound at the center Wound type:Neoplasm Pressure Injury POA: No Measurement:Lesion measures 12cm x 16cm with full thickness wound measures 6.5cm x 8cm x 1.5cm. Wound bed is 40% yellow slough, 60% red friable tissue. Serous exudate in a small amount. Wound bed:As described above Drainage (amount, consistency, odor) See above Periwound: Intact Dressing procedure/placement/frequency: I will continue the POC being used as an outpatient that is, saline moistened gauze. I will increase the frequency slightly to twice daily rather than once daily to avoid dressing adherence. [place] nursing team will not follow, but will remain available to this patient, the nursing and medical teams.  Please re-consult if needed. Thanks, Maudie Flakes, MSN, RN, [place], Arther Abbott  Pager# [phone]

## 2016-02-26 NOTE — Progress Notes (Signed)
Patient took Miralax this afternoon. Will continue to monitor.

## 2016-02-27 ENCOUNTER — Inpatient Hospital Stay (HOSPITAL_COMMUNITY): Payer: Medicaid Other

## 2016-02-27 DIAGNOSIS — R509 Fever, unspecified: Secondary | ICD-10-CM

## 2016-02-27 DIAGNOSIS — E440 Moderate protein-calorie malnutrition: Secondary | ICD-10-CM | POA: Insufficient documentation

## 2016-02-27 DIAGNOSIS — A419 Sepsis, unspecified organism: Secondary | ICD-10-CM

## 2016-02-27 LAB — BASIC METABOLIC PANEL
Anion gap: 9 (ref 5–15)
BUN: 30 mg/dL — AB (ref 6–20)
CALCIUM: 9.7 mg/dL (ref 8.9–10.3)
CO2: 25 mmol/L (ref 22–32)
CREATININE: 1.43 mg/dL — AB (ref 0.61–1.24)
Chloride: 106 mmol/L (ref 101–111)
GFR calc Af Amer: >60 mL/min (ref >60)
GFR, EST NON AFRICAN AMERICAN: 59 mL/min — AB (ref >60)
GLUCOSE: 111 mg/dL — AB (ref 65–99)
Potassium: 3 mmol/L — ABNORMAL LOW (ref 3.5–5.1)
SODIUM: 140 mmol/L (ref 135–145)

## 2016-02-27 LAB — MAGNESIUM: Magnesium: 1.6 mg/dL — ABNORMAL LOW (ref 1.7–2.4)

## 2016-02-27 MED ORDER — SODIUM CHLORIDE 0.9 % IV SOLN
INTRAVENOUS | Status: DC
  Administered 2016-02-27 – 2016-02-28 (×2): via INTRAVENOUS

## 2016-02-27 MED ORDER — OXYCODONE HCL 5 MG/5ML PO SOLN
5.0000 mg | ORAL | Status: DC | PRN
  Administered 2016-03-02 – 2016-03-07 (×13): 5 mg via ORAL
  Filled 2016-02-27 (×17): qty 5

## 2016-02-27 MED ORDER — POTASSIUM CHLORIDE CRYS ER 20 MEQ PO TBCR
40.0000 meq | EXTENDED_RELEASE_TABLET | Freq: Three times a day (TID) | ORAL | Status: AC
  Administered 2016-02-27: 40 meq via ORAL
  Filled 2016-02-27 (×2): qty 2

## 2016-02-27 MED ORDER — DEXTROSE 5 % IV SOLN
1.0000 g | INTRAVENOUS | Status: DC
  Administered 2016-02-27: 1 g via INTRAVENOUS
  Filled 2016-02-27 (×2): qty 10

## 2016-02-27 MED ORDER — VANCOMYCIN HCL IN DEXTROSE 750-5 MG/150ML-% IV SOLN
750.0000 mg | Freq: Two times a day (BID) | INTRAVENOUS | Status: DC
Start: 2016-02-27 — End: 2016-03-01
  Administered 2016-02-27 – 2016-03-01 (×6): 750 mg via INTRAVENOUS
  Filled 2016-02-27 (×7): qty 150

## 2016-02-27 MED ORDER — ACETAMINOPHEN 325 MG PO TABS
650.0000 mg | ORAL_TABLET | Freq: Four times a day (QID) | ORAL | Status: DC | PRN
  Administered 2016-02-28 – 2016-03-07 (×6): 650 mg via ORAL
  Filled 2016-02-27 (×8): qty 2

## 2016-02-27 MED ORDER — ACETAMINOPHEN 650 MG RE SUPP
650.0000 mg | Freq: Once | RECTAL | Status: AC
  Administered 2016-02-27: 650 mg via RECTAL
  Filled 2016-02-27: qty 1

## 2016-02-27 MED ORDER — PIPERACILLIN-TAZOBACTAM 3.375 G IVPB 30 MIN
3.3750 g | Freq: Once | INTRAVENOUS | Status: AC
  Administered 2016-02-27: 3.375 g via INTRAVENOUS
  Filled 2016-02-27 (×2): qty 50

## 2016-02-27 MED ORDER — PIPERACILLIN-TAZOBACTAM 3.375 G IVPB
3.3750 g | Freq: Three times a day (TID) | INTRAVENOUS | Status: DC
Start: 2016-02-27 — End: 2016-02-27
  Administered 2016-02-27: 3.375 g via INTRAVENOUS
  Filled 2016-02-27: qty 50

## 2016-02-27 NOTE — Progress Notes (Signed)
SLP Cancellation Note  Patient Details Name: Johnny Mooney MRN: [id number] DOB: 1970-08-17   Cancelled treatment:       Reason Eval/Treat Not Completed: Other (comment) (Patient refused stating that he needed to talk to his family.). SLP spoke with patient's mother in hallway regarding his PO intake, swallowing. She does not feel that patient has any difficulty swallowing at this time, as she saw him drink three large drinks today without any coughing. She also said she has been trying to get him to eat more. When SLP walked into patient's room, he was noticeably upset about something and there were multiple family members in room. After SLP introduced himself and explained reason for coming to see patient, he refused saying, "My mouth is just dry, I need to talk to my family." Although SLP waited outside patient's room completing documentation, after 20 minutes, it was evident that patient did not want to have BSE at this time.   Sonia Baller, MA, CCC-SLP Speech Therapy [facility]

## 2016-02-27 NOTE — Progress Notes (Signed)
Pharmacy Antibiotic Note  Johnny Mooney is a 46 y.o. male  with squamous skin cancer of the neck/scalp currently undergoing XRT, presented to the ED on 02/25/16 with c/o generalized pain and was found to have hypercalcemia of malignancy.  Patient was found to be febrile on 02/27/16 with Tmax 103.  Zosyn was started early this morning -- change abx to vancomycin and ceftriaxone for fever and necrotic / ulceration of lymph nodes in head/neck.  - Tmax 103, wbc 26.6, scr down 1.43 (crcl~66)   Plan: - vancomycin 750 mg IV q12h - ceftriaxone 1 gm IV q24h per MD - monitor renal function closely  _______________________________________  Height: 5\' 11"  (180.3 cm) Weight: 155 lb (70.3 kg) IBW/kg (Calculated) : 75.3  Temp (24hrs), Avg:99.4 F (37.4 C), Min:97.8 F (36.6 C), Max:103 F (39.4 C)   Recent Labs Lab 02/25/16 1339 02/25/16 1339 02/25/16 2046 02/25/16 2051 02/25/16 2305 02/26/16 0554 02/27/16 0245  WBC  --  38.3* 32.6*  --  31.1* 26.6*  --   CREATININE 2.2*  --   --  1.80* 1.74*  --  1.43*    Estimated Creatinine Clearance: 66.2 mL/min (by C-G formula based on SCr of 1.43 mg/dL (H)).    No Known Allergies   Antimicrobials this admission:  2/3 vanc >>  2/3 zosyn >> 2/3 2/3 CTX>>  Dose adjustments this admission:  n/a  Microbiology results:  2/3 BCx x2:  Thank you for allowing pharmacy to be a part of this patient's care.  Dia Sitter P 02/27/2016 10:20 AM

## 2016-02-27 NOTE — Progress Notes (Addendum)
[facility] PROGRESS NOTE    Progress Note  Johnny Mooney  [id number] DOB: 01/03/1971 DOA: 02/25/2016 PCP: No PCP Per Patient     Brief Narrative:   Johnny Mooney is an 46 y.o. male past medical history of squamous cell skin cancer stage IV he was seen by radiation oncology appointment and was urged to come to the ED due to constipation, decreased oral intake for the past several days, in the ED he was found to have a calcium of greater than 16 and acute kidney injury.  Assessment/Plan:   Hypercalcemia of malignancy: He was started on IV fluids calcitonin and zoledronic acid. Calcium is improved today she seems to be euvolemic. Continue IV fluids.  Acute encephalopathy: Resolved, Today is that a lot of complains of tingling to talk.  Acute kidney injury: With baseline creatinine of less than 1, likely due to hypercalcemia. Creatinine is slowly improving. Continue aggressive fluid hydration.  Constipation: Due to hypercalcemia Cont. on MiraLAX 3 times a day.  Squamous cell carcinoma of neck Follow-up with radiation oncology as an outpatient. Continue current pain regimen.  Hypomagnesemia: Replete magnesium checked in the morning.  Severe protein caloric malnutrition: Cont Jevity and Regular diet. His girl friend has been eating his food. He would like to think about the PEG tube placement as he relates this is a big step. I have encouraged him to eat his meal and [name] that he doesn't like it are wanted. Further discuss PEG tube placement over the weekend.  Sepsis and fever and leukocytosis due to head and neck wound infection: Started empirically overnight on zosyn blood cultures were ordered. I will change this IV vancomycin and Rocephin, get blood cultures if he spikes a fever.  DVT prophylaxis: heparin Family Communication:none Disposition Plan/Barrier to D/C: home in once fever source identify Code Status:     Code Status Orders        Start     Ordered   02/25/16 2224  Full code  Continuous     02/25/16 2223    Code Status History    Date Active Date Inactive Code Status Order ID Comments User Context   01/28/2016 10:31 PM 02/02/2016  9:29 PM Full Code [id number]  Alphonsa Overall, MD Inpatient   02/20/2012 12:59 AM 02/22/2012  3:31 PM Full Code [id number]  Rise Patience, MD Inpatient        IV Access:    Peripheral IV   Procedures and diagnostic studies:   Dg Chest Port 1 View  Result Date: 02/27/2016 CLINICAL DATA:  Fever. Smoker. Squamous cell carcinoma of the right occipital scalp. EXAM: PORTABLE CHEST 1 VIEW COMPARISON:  PET-CT 02/17/2016.  Radiographs 02/23/2009. FINDINGS: 0444 hours. The heart size and mediastinal contours are normal. The lungs are clear. There is no pleural effusion or pneumothorax. No acute osseous findings are identified. IMPRESSION: No active cardiopulmonary process. Electronically Signed   By: Richardean Sale M.D.   On: 02/27/2016 07:22     Medical Consultants:    None.  Anti-Infectives:   None  Subjective:    Johnny Mooney patient is more talkative today. I will have a straight a long conversation about his needs and wants.  Objective:    Vitals:   02/27/16 0142 02/27/16 0251 02/27/16 0400 02/27/16 0500  BP: 115/67  106/71   Pulse: (!) 114     Resp:   18   Temp:  99 F (37.2 C)  97.8 F (36.6 C)  TempSrc:  Oral  Oral   SpO2: 96%  95%   Weight:      Height:        Intake/Output Summary (Last 24 hours) at 02/27/16 0948 Last data filed at 02/27/16 0602  Gross per 24 hour  Intake             2445 ml  Output             3250 ml  Net             -805 ml   Filed Weights   02/25/16 1526  Weight: 70.3 kg (155 lb)    Exam: General exam: In no acute distress.Cachectic appearing Respiratory system: Good air movement and clear to auscultation. Cardiovascular system: S1 & S2 heard, RRR. No JVD. Gastrointestinal system: Abdomen is nondistended, soft and  nontender.  Extremities: No pedal edema. Skin: Large mass on the occipital area, ulcerated with purulent drainage Psychiatry: Judgement and insight appear normal. Mood & affect appropriate.   Data Reviewed:    Labs: Basic Metabolic Panel:  Recent Labs Lab 02/25/16 1339  02/25/16 2051 02/25/16 2305 02/27/16 0245  NA 138  --  137 136 140  K 3.5  < > 3.2* 3.1* 3.0*  CL  --   --  100* 100* 106  CO2 30*  --  28 28 25   GLUCOSE 118  --  98 97 111*  BUN 72.3*  --  64* 62* 30*  CREATININE 2.2*  --  1.80* 1.74* 1.43*  CALCIUM >16.0*  --  13.2* 12.6* 9.7  MG  --   --  1.6* 1.6* 1.6*  < > = values in this interval not displayed. GFR Estimated Creatinine Clearance: 66.2 mL/min (by C-G formula based on SCr of 1.43 mg/dL (H)). Liver Function Tests:  Recent Labs Lab 02/25/16 1339  AST 61*  ALT 38  ALKPHOS 194*  BILITOT 0.43  PROT 9.1*  ALBUMIN 3.0*   No results for input(s): LIPASE, AMYLASE in the last 168 hours. No results for input(s): AMMONIA in the last 168 hours. Coagulation profile No results for input(s): INR, PROTIME in the last 168 hours.  CBC:  Recent Labs Lab 02/25/16 1339 02/25/16 2046 02/25/16 2305 02/26/16 0554  WBC 38.3* 32.6* 31.1* 26.6*  NEUTROABS 32.7* 28.2*  --   --   HGB 11.7* 9.7* 9.4* 8.6*  HCT 35.4* 28.8* 27.0* 25.1*  MCV 80.8 78.5 79.2 79.7  PLT 414* 394 371 361   Cardiac Enzymes:  Recent Labs Lab 02/25/16 2046  TROPONINI 0.06*   BNP (last 3 results) No results for input(s): PROBNP in the last 8760 hours. CBG: No results for input(s): GLUCAP in the last 168 hours. D-Dimer: No results for input(s): DDIMER in the last 72 hours. Hgb A1c: No results for input(s): HGBA1C in the last 72 hours. Lipid Profile: No results for input(s): CHOL, HDL, LDLCALC, TRIG, CHOLHDL, LDLDIRECT in the last 72 hours. Thyroid function studies:  Recent Labs  02/25/16 1339  TSH 2.206   Anemia work up: No results for input(s): VITAMINB12, FOLATE,  FERRITIN, TIBC, IRON, RETICCTPCT in the last 72 hours. Sepsis Labs:  Recent Labs Lab 02/25/16 1339 02/25/16 2046 02/25/16 2305 02/26/16 0554  WBC 38.3* 32.6* 31.1* 26.6*   Microbiology Recent Results (from the past 240 hour(s))  TECHNOLOGIST REVIEW     Status: None   Collection Time: 02/25/16  1:39 PM  Result Value Ref Range Status   Technologist Review few target cells  Final     Medications:   .  calcitonin  280 Units Intramuscular Q12H  . heparin  5,000 Units Subcutaneous Q8H  . piperacillin-tazobactam (ZOSYN)  IV  3.375 g Intravenous Q8H  . polyethylene glycol  17 g Oral TID  . sodium chloride flush  3 mL Intravenous Q12H   Continuous Infusions: . sodium chloride 150 mL/hr at 02/27/16 [id number]    Time spent: 25 min   LOS: 2 days   [name]  [facility] Pager [phone]  *Please refer to [email or web address], password [email or web address] to get updated schedule on who will round on this patient, as hospitalists switch teams weekly. If 7PM-7AM, please contact night-coverage at [email or web address], password [email or web address] for any overnight needs.  02/27/2016, 9:48 AM

## 2016-02-27 NOTE — Progress Notes (Signed)
Pharmacy Antibiotic Note  Johnny Mooney is a 46 y.o. male admitted on 02/25/2016 with Empiric, fever, leukocytosis.  Pharmacy has been consulted for zosyn dosing.  Plan: Zosyn 3.375g IV q8h (4 hour infusion).  Height: 5\' 11"  (180.3 cm) Weight: 155 lb (70.3 kg) IBW/kg (Calculated) : 75.3  Temp (24hrs), Avg:99.8 F (37.7 C), Min:98.3 F (36.8 C), Max:103 F (39.4 C)   Recent Labs Lab 02/25/16 1339 02/25/16 1339 02/25/16 2046 02/25/16 2051 02/25/16 2305 02/26/16 0554  WBC  --  38.3* 32.6*  --  31.1* 26.6*  CREATININE 2.2*  --   --  1.80* 1.74*  --     Estimated Creatinine Clearance: 54.4 mL/min (by C-G formula based on SCr of 1.74 mg/dL (H)).    No Known Allergies  Antimicrobials this admission: Zosyn 2/3 >>  Dose adjustments this admission: -  Microbiology results: pending  Thank you for allowing pharmacy to be a part of this patient's care.  Nani Skillern Crowford 02/27/2016 1:41 AM

## 2016-02-27 NOTE — Progress Notes (Addendum)
Patient had a fever last night of 103. On call notified and new orders were given for a tylenol supp, chest xray, blood cultures, and IV antibiotics. When temp was rechecked it was 99. Marland Kitchen

## 2016-02-28 ENCOUNTER — Inpatient Hospital Stay (HOSPITAL_COMMUNITY): Payer: Medicaid Other

## 2016-02-28 LAB — BASIC METABOLIC PANEL
Anion gap: 11 (ref 5–15)
BUN: 13 mg/dL (ref 6–20)
CALCIUM: 7.9 mg/dL — AB (ref 8.9–10.3)
CO2: 22 mmol/L (ref 22–32)
CREATININE: 1.29 mg/dL — AB (ref 0.61–1.24)
Chloride: 103 mmol/L (ref 101–111)
GFR calc non Af Amer: >60 mL/min (ref >60)
Glucose, Bld: 88 mg/dL (ref 65–99)
Potassium: 3 mmol/L — ABNORMAL LOW (ref 3.5–5.1)
Sodium: 136 mmol/L (ref 135–145)

## 2016-02-28 LAB — CBC
HCT: 27.9 % — ABNORMAL LOW (ref 39.0–52.0)
Hemoglobin: 9.5 g/dL — ABNORMAL LOW (ref 13.0–17.0)
MCH: 26.4 pg (ref 26.0–34.0)
MCHC: 34.1 g/dL (ref 30.0–36.0)
MCV: 77.5 fL — ABNORMAL LOW (ref 78.0–100.0)
PLATELETS: 294 10*3/uL (ref 150–400)
RBC: 3.6 MIL/uL — AB (ref 4.22–5.81)
RDW: 13.7 % (ref 11.5–15.5)
WBC: 24.4 10*3/uL — AB (ref 4.0–10.5)

## 2016-02-28 MED ORDER — PIPERACILLIN-TAZOBACTAM 3.375 G IVPB
3.3750 g | Freq: Three times a day (TID) | INTRAVENOUS | Status: DC
  Administered 2016-02-28 – 2016-03-02 (×9): 3.375 g via INTRAVENOUS
  Filled 2016-02-28 (×9): qty 50

## 2016-02-28 MED ORDER — SODIUM CHLORIDE 0.9 % IV SOLN
INTRAVENOUS | Status: DC
  Administered 2016-02-28 (×2): via INTRAVENOUS

## 2016-02-28 NOTE — Plan of Care (Signed)
Problem: Safety: Goal: Ability to remain free from injury will improve Outcome: Completed/Met Date Met: 02/28/16 Bed alarm set

## 2016-02-28 NOTE — Progress Notes (Signed)
patient was out of bed into chair. activity tolerated well. Dressing changed also.

## 2016-02-28 NOTE — Progress Notes (Signed)
[facility] PROGRESS NOTE    Progress Note  Johnny Mooney  [id number] DOB: 1970/10/04 DOA: 02/25/2016 PCP: No PCP Per Patient     Brief Narrative:   Johnny Mooney is an 46 y.o. male past medical history of squamous cell skin cancer stage IV he was seen by radiation oncology appointment and was urged to come to the ED due to constipation, decreased oral intake for the past several days, in the ED he was found to have a calcium of greater than 16 and acute kidney injury.  Assessment/Plan:   Hypercalcemia of malignancy: Treated with IV fluids calcitonin and zoledronic acid. Calcium is improved today she seems to be euvolemic.  Acute encephalopathy: Resolved, Today is that a lot of complains.  Acute kidney injury: With baseline creatinine of less than 1, likely due to hypercalcemia. Creatinine is slowly improving. Continue  fluid hydration.  Constipation: Due to hypercalcemia Cont. on MiraLAX 3 times a day.  Squamous cell carcinoma of neck Follow-up with radiation oncology as an outpatient. Continue current pain regimen.  Hypomagnesemia: Replete magnesium checked in the morning.  Severe protein caloric malnutrition: Cont Jevity and Regular diet. His girl friend has been eating his food. He would like to think about the PEG tube placement as he relates this is a big step. I have encouraged him to eat his meal and [name] that he doesn't like it are wanted. Further discuss PEG tube placement over the weekend.  Sepsis and fever and leukocytosis due to head and neck wound infection: Cont to spike fever, cbc is pending. BS are pending. I will broaden abx to zosyn. CT of the head to rule out metastatic sees and or infectious etiology.  Pressure speech: I will have psychiatry evaluate him for probable bipolar disorder and capacity  DVT prophylaxis: heparin Family Communication:none Disposition Plan/Barrier to D/C: home once fever source identify and  afebrile. Code Status:     Code Status Orders        Start     Ordered   02/25/16 2224  Full code  Continuous     02/25/16 2223    Code Status History    Date Active Date Inactive Code Status Order ID Comments User Context   01/28/2016 10:31 PM 02/02/2016  9:29 PM Full Code [id number]  Alphonsa Overall, MD Inpatient   02/20/2012 12:59 AM 02/22/2012  3:31 PM Full Code [id number]  Rise Patience, MD Inpatient        IV Access:    Peripheral IV   Procedures and diagnostic studies:   Dg Chest Port 1 View  Result Date: 02/27/2016 CLINICAL DATA:  Fever. Smoker. Squamous cell carcinoma of the right occipital scalp. EXAM: PORTABLE CHEST 1 VIEW COMPARISON:  PET-CT 02/17/2016.  Radiographs 02/23/2009. FINDINGS: 0444 hours. The heart size and mediastinal contours are normal. The lungs are clear. There is no pleural effusion or pneumothorax. No acute osseous findings are identified. IMPRESSION: No active cardiopulmonary process. Electronically Signed   By: Richardean Sale M.D.   On: 02/27/2016 07:22     Medical Consultants:    None.  Anti-Infectives:   None  Subjective:    [name] patient is more talkative today. Tangential and pressure.  Objective:    Vitals:   02/27/16 0705 02/27/16 1500 02/27/16 2206 02/28/16 0604  BP:  110/70 (!) 104/58 119/76  Pulse: 91 92 (!) 115 (!) 105  Resp:  18 20 20   Temp:  99 F (37.2 C) (!) 100.5 F (38.1 C) (!)  100.8 F (38.2 C)  TempSrc:  Oral Oral Oral  SpO2:  95% 98% 100%  Weight:      Height:        Intake/Output Summary (Last 24 hours) at 02/28/16 0944 Last data filed at 02/28/16 0500  Gross per 24 hour  Intake          2023.33 ml  Output             4950 ml  Net         -2926.67 ml   Filed Weights   02/25/16 1526  Weight: 70.3 kg (155 lb)    Exam: General exam: In no acute distress.Cachectic appearing Respiratory system: Good air movement and clear to auscultation. Cardiovascular system: S1 & S2 heard, RRR.  No JVD. Gastrointestinal system: Abdomen is nondistended, soft and nontender.  Extremities: No pedal edema. Skin: Large mass on the occipital area, ulcerated with purulent drainage Psychiatry: Judgement and insight appear normal. Mood & affect appropriate.   Data Reviewed:    Labs: Basic Metabolic Panel:  Recent Labs Lab 02/25/16 1339  02/25/16 2051 02/25/16 2305 02/27/16 0245 02/28/16 0514  NA 138  --  137 136 140 136  K 3.5  < > 3.2* 3.1* 3.0* 3.0*  CL  --   --  100* 100* 106 103  CO2 30*  --  28 28 25 22   GLUCOSE 118  --  98 97 111* 88  BUN 72.3*  --  64* 62* 30* 13  CREATININE 2.2*  --  1.80* 1.74* 1.43* 1.29*  CALCIUM >16.0*  --  13.2* 12.6* 9.7 7.9*  MG  --   --  1.6* 1.6* 1.6*  --   < > = values in this interval not displayed. GFR Estimated Creatinine Clearance: 73.4 mL/min (by C-G formula based on SCr of 1.29 mg/dL (H)). Liver Function Tests:  Recent Labs Lab 02/25/16 1339  AST 61*  ALT 38  ALKPHOS 194*  BILITOT 0.43  PROT 9.1*  ALBUMIN 3.0*   No results for input(s): LIPASE, AMYLASE in the last 168 hours. No results for input(s): AMMONIA in the last 168 hours. Coagulation profile No results for input(s): INR, PROTIME in the last 168 hours.  CBC:  Recent Labs Lab 02/25/16 1339 02/25/16 2046 02/25/16 2305 02/26/16 0554  WBC 38.3* 32.6* 31.1* 26.6*  NEUTROABS 32.7* 28.2*  --   --   HGB 11.7* 9.7* 9.4* 8.6*  HCT 35.4* 28.8* 27.0* 25.1*  MCV 80.8 78.5 79.2 79.7  PLT 414* 394 371 361   Cardiac Enzymes:  Recent Labs Lab 02/25/16 2046  TROPONINI 0.06*   BNP (last 3 results) No results for input(s): PROBNP in the last 8760 hours. CBG: No results for input(s): GLUCAP in the last 168 hours. D-Dimer: No results for input(s): DDIMER in the last 72 hours. Hgb A1c: No results for input(s): HGBA1C in the last 72 hours. Lipid Profile: No results for input(s): CHOL, HDL, LDLCALC, TRIG, CHOLHDL, LDLDIRECT in the last 72 hours. Thyroid function  studies:  Recent Labs  02/25/16 1339  TSH 2.206   Anemia work up: No results for input(s): VITAMINB12, FOLATE, FERRITIN, TIBC, IRON, RETICCTPCT in the last 72 hours. Sepsis Labs:  Recent Labs Lab 02/25/16 1339 02/25/16 2046 02/25/16 2305 02/26/16 0554  WBC 38.3* 32.6* 31.1* 26.6*   Microbiology Recent Results (from the past 240 hour(s))  TECHNOLOGIST REVIEW     Status: None   Collection Time: 02/25/16  1:39 PM  Result Value Ref Range Status  Technologist Review few target cells  Final     Medications:   . calcitonin  280 Units Intramuscular Q12H  . cefTRIAXone (ROCEPHIN)  IV  1 g Intravenous Q24H  . heparin  5,000 Units Subcutaneous Q8H  . polyethylene glycol  17 g Oral TID  . potassium chloride  40 mEq Oral TID  . sodium chloride flush  3 mL Intravenous Q12H  . vancomycin  750 mg Intravenous Q12H   Continuous Infusions: . sodium chloride 100 mL/hr at 02/28/16 0611    Time spent: 25 min   LOS: 3 days   [name]  [facility] Pager [phone]  *Please refer to [email or web address], password [email or web address] to get updated schedule on who will round on this patient, as hospitalists switch teams weekly. If 7PM-7AM, please contact night-coverage at [email or web address], password [email or web address] for any overnight needs.  02/28/2016, 9:44 AM

## 2016-02-28 NOTE — Consult Note (Signed)
Patient seen in his room in the presence of his mother and best friend. He refused psychiatric evaluation and insist that he would rather discuss his issues with Dr. Olevia Bowens. All efforts made to convince patient proved futile. Plan: Discus patient request with his nurse. Re-consult psych service when patient is ready for evaluation. Corena Pilgrim, MD

## 2016-02-28 NOTE — Progress Notes (Addendum)
K+ 3.0,and Ca 7.9. provider contacted regarding lab values. Awaiting orders

## 2016-02-28 NOTE — Plan of Care (Signed)
Problem: Education: Goal: Knowledge of [facility] Education information/materials will improve Outcome: Completed/Met Date Met: 02/28/16 Education provided to patient and mother. Ongoing plan of care discussed by provider

## 2016-02-29 ENCOUNTER — Encounter: Payer: Self-pay | Admitting: *Deleted

## 2016-02-29 ENCOUNTER — Ambulatory Visit
Admission: RE | Admit: 2016-02-29 | Discharge: 2016-02-29 | Disposition: A | Discharge status: Home / Self Care | Payer: Medicaid Other | Source: Ambulatory Visit | Attending: Radiation Oncology | Admitting: Radiation Oncology

## 2016-02-29 ENCOUNTER — Ambulatory Visit: Admit: 2016-02-29 | Payer: Medicaid Other

## 2016-02-29 DIAGNOSIS — C4442 Squamous cell carcinoma of skin of scalp and neck: Secondary | ICD-10-CM

## 2016-02-29 LAB — BASIC METABOLIC PANEL
Anion gap: 12 (ref 5–15)
BUN: 9 mg/dL (ref 6–20)
CO2: 23 mmol/L (ref 22–32)
CREATININE: 1.31 mg/dL — AB (ref 0.61–1.24)
Calcium: 7.1 mg/dL — ABNORMAL LOW (ref 8.9–10.3)
Chloride: 103 mmol/L (ref 101–111)
GFR calc Af Amer: >60 mL/min (ref >60)
GLUCOSE: 119 mg/dL — AB (ref 65–99)
POTASSIUM: 2.4 mmol/L — AB (ref 3.5–5.1)
SODIUM: 138 mmol/L (ref 135–145)

## 2016-02-29 MED ORDER — SODIUM CHLORIDE 0.9 % IV SOLN
INTRAVENOUS | Status: DC
  Administered 2016-02-29 – 2016-03-01 (×2): via INTRAVENOUS
  Filled 2016-02-29 (×5): qty 1000

## 2016-02-29 MED ORDER — POTASSIUM CHLORIDE CRYS ER 20 MEQ PO TBCR
40.0000 meq | EXTENDED_RELEASE_TABLET | Freq: Three times a day (TID) | ORAL | Status: AC
  Administered 2016-02-29 (×3): 40 meq via ORAL
  Filled 2016-02-29 (×3): qty 2

## 2016-02-29 MED ORDER — ENSURE ENLIVE PO LIQD
237.0000 mL | Freq: Two times a day (BID) | ORAL | Status: DC
  Administered 2016-02-29 – 2016-03-05 (×4): 237 mL via ORAL

## 2016-02-29 MED ORDER — SODIUM CHLORIDE 0.45 % IV SOLN
INTRAVENOUS | Status: DC
  Administered 2016-02-29: 10:00:00 via INTRAVENOUS

## 2016-02-29 MED ORDER — ADULT MULTIVITAMIN W/MINERALS CH
1.0000 | ORAL_TABLET | Freq: Every day | ORAL | Status: DC
  Administered 2016-03-02 – 2016-03-07 (×6): 1 via ORAL
  Filled 2016-02-29 (×7): qty 1

## 2016-02-29 NOTE — Progress Notes (Signed)
[facility] PROGRESS NOTE    Progress Note  Johnny Mooney  [id number] DOB: 1970-04-30 DOA: 02/25/2016 PCP: No PCP Per Patient     Brief Narrative:   Johnny Mooney is an 46 y.o. male past medical history of squamous cell skin cancer stage IV he was seen by radiation oncology appointment and was urged to come to the ED due to constipation, decreased oral intake for the past several days, in the ED he was found to have a calcium of greater than 16 and acute kidney injury.  Assessment/Plan:   Hypercalcemia of malignancy: Treated with IV fluids calcitonin and zoledronic acid. Calcium is improved today she seems to be euvolemic.  Acute encephalopathy: Resolved, Today he has a lot of complains.  Acute kidney injury: With baseline creatinine of less than 1, likely due to hypercalcemia. Creatinine is slowly improving. Continue  fluid hydration.  Constipation: Due to hypercalcemia Cont. on MiraLAX 3 times a day.  Squamous cell carcinoma of neck Follow-up with radiation oncology as an outpatient. Continue current pain regimen.  Hypomagnesemia: Replete magnesium checked in the morning.  Severe protein caloric malnutrition: Cont Jevity and Regular diet. His girl friend has been eating his food. He would like to think about the PEG tube placement as he relates this is a big step. I have encouraged him to eat his meal. He refused peg tube.  Sepsis and fever and leukocytosis due to head and neck wound infection: Cont to spike fever, cbc shows high WBC, culture data cont to be negative Cont IV vanc and zosyn. CT of the head no metastatic disease and or infectious source.  Pressure speech: HE refused to speak with psyq, mother relates that he is back to [place]  DVT prophylaxis: heparin Family Communication:none Disposition Plan/Barrier to D/C: home once fever source identify and afebrile. Code Status:     Code Status Orders        Start     Ordered    02/25/16 2224  Full code  Continuous     02/25/16 2223    Code Status History    Date Active Date Inactive Code Status Order ID Comments User Context   01/28/2016 10:31 PM 02/02/2016  9:29 PM Full Code [id number]  Alphonsa Overall, MD Inpatient   02/20/2012 12:59 AM 02/22/2012  3:31 PM Full Code [id number]  Rise Patience, MD Inpatient        IV Access:    Peripheral IV   Procedures and diagnostic studies:   Ct Head Wo Contrast  Result Date: 02/28/2016 CLINICAL DATA:  Metabolic encephalopathy. A subcutaneous mass of the head. EXAM: CT HEAD WITHOUT CONTRAST TECHNIQUE: Contiguous axial images were obtained from the base of the skull through the vertex without intravenous contrast. COMPARISON:  02/17/2016; 12/10/2015; PET-CT - 02/17/2016 FINDINGS: Brain: Gray-white differentiation is maintained. No CT evidence of acute large territory infarct. No intraparenchymal or extra-axial mass or hemorrhage. Unchanged size and configuration of the ventricles and the basilar cisterns. No midline shift. Vascular: No hyperdense vessel or unexpected calcification. Skull: No displaced calvarial fracture. Sinuses/Orbits: Limited visualization of the paranasal sinuses and mastoid air cells is normal. No air-fluid levels. Other: Grossly unchanged fungating subcutaneous mass about the right inferolateral scalp with dominant component measuring approximately 7.1 x 11.2 cm (image 2, series 2). Re- demonstrated associated skin ulceration. IMPRESSION: 1. No acute intracranial process. 2. Unchanged fungating mass involving the inferolateral aspect the right-side of the scalp with dominant component measuring approximately 11.2 cm. Electronically Signed  By: Sandi Mariscal M.D.   On: 02/28/2016 14:21     Medical Consultants:    None.  Anti-Infectives:   None  Subjective:    Johnny Mooney patient is more talkative today. Tangential and pressure.  Objective:    Vitals:   02/28/16 0604 02/28/16 1451  02/28/16 2329 02/29/16 0713  BP: 119/76 120/78 108/81 106/63  Pulse: (!) 105 (!) 110 100 (!) 119  Resp: 20 20 20 20   Temp: (!) 100.8 F (38.2 C) (!) 100.6 F (38.1 C) 98.7 F (37.1 C) (!) 102.2 F (39 C)  TempSrc: Oral Oral Oral Oral  SpO2: 100% 100% 100% 97%  Weight:      Height:        Intake/Output Summary (Last 24 hours) at 02/29/16 1338 Last data filed at 02/29/16 0800  Gross per 24 hour  Intake             2300 ml  Output             2550 ml  Net             -250 ml   Filed Weights   02/25/16 1526  Weight: 70.3 kg (155 lb)    Exam: General exam: In no acute distress.Cachectic appearing Respiratory system: Good air movement and clear to auscultation. Cardiovascular system: S1 & S2 heard, RRR. No JVD. Gastrointestinal system: Abdomen is nondistended, soft and nontender.  Extremities: No pedal edema. Skin: Large mass on the occipital area, ulcerated with purulent drainage Psychiatry: Judgement and insight appear normal. Mood & affect appropriate.   Data Reviewed:    Labs: Basic Metabolic Panel:  Recent Labs Lab 02/25/16 2051 02/25/16 2305 02/27/16 0245 02/28/16 0514 02/29/16 0805  NA 137 136 140 136 138  K 3.2* 3.1* 3.0* 3.0* 2.4*  CL 100* 100* 106 103 103  CO2 28 28 25 22 23   GLUCOSE 98 97 111* 88 119*  BUN 64* 62* 30* 13 9  CREATININE 1.80* 1.74* 1.43* 1.29* 1.31*  CALCIUM 13.2* 12.6* 9.7 7.9* 7.1*  MG 1.6* 1.6* 1.6*  --   --    GFR Estimated Creatinine Clearance: 72.3 mL/min (by C-G formula based on SCr of 1.31 mg/dL (H)). Liver Function Tests:  Recent Labs Lab 02/25/16 1339  AST 61*  ALT 38  ALKPHOS 194*  BILITOT 0.43  PROT 9.1*  ALBUMIN 3.0*   No results for input(s): LIPASE, AMYLASE in the last 168 hours. No results for input(s): AMMONIA in the last 168 hours. Coagulation profile No results for input(s): INR, PROTIME in the last 168 hours.  CBC:  Recent Labs Lab 02/25/16 1339 02/25/16 2046 02/25/16 2305 02/26/16 0554  02/28/16 1152  WBC 38.3* 32.6* 31.1* 26.6* 24.4*  NEUTROABS 32.7* 28.2*  --   --   --   HGB 11.7* 9.7* 9.4* 8.6* 9.5*  HCT 35.4* 28.8* 27.0* 25.1* 27.9*  MCV 80.8 78.5 79.2 79.7 77.5*  PLT 414* 394 371 361 294   Cardiac Enzymes:  Recent Labs Lab 02/25/16 2046  TROPONINI 0.06*   BNP (last 3 results) No results for input(s): PROBNP in the last 8760 hours. CBG: No results for input(s): GLUCAP in the last 168 hours. D-Dimer: No results for input(s): DDIMER in the last 72 hours. Hgb A1c: No results for input(s): HGBA1C in the last 72 hours. Lipid Profile: No results for input(s): CHOL, HDL, LDLCALC, TRIG, CHOLHDL, LDLDIRECT in the last 72 hours. Thyroid function studies: No results for input(s): TSH, T4TOTAL, T3FREE, THYROIDAB  in the last 72 hours.  Invalid input(s): FREET3 Anemia work up: No results for input(s): VITAMINB12, FOLATE, FERRITIN, TIBC, IRON, RETICCTPCT in the last 72 hours. Sepsis Labs:  Recent Labs Lab 02/25/16 2046 02/25/16 2305 02/26/16 0554 02/28/16 1152  WBC 32.6* 31.1* 26.6* 24.4*   Microbiology Recent Results (from the past 240 hour(s))  TECHNOLOGIST REVIEW     Status: None   Collection Time: 02/25/16  1:39 PM  Result Value Ref Range Status   Technologist Review few target cells  Final  Culture, blood (routine x 2)     Status: None (Preliminary result)   Collection Time: 02/27/16  2:36 AM  Result Value Ref Range Status   Specimen Description BLOOD LEFT HAND  Final   Special Requests BOTTLES DRAWN AEROBIC AND ANAEROBIC 5 CC  Final   Culture   Final    NO GROWTH 1 DAY Performed at [facility], [address] [place], [place] [address]    Report Status PENDING  Incomplete  Culture, blood (routine x 2)     Status: None (Preliminary result)   Collection Time: 02/27/16  2:36 AM  Result Value Ref Range Status   Specimen Description BLOOD RIGHT HAND  Final   Special Requests BOTTLES DRAWN AEROBIC ONLY 5 CC  Final   Culture   Final    NO  GROWTH 1 DAY Performed at [facility], [place] [address] [place], [place] [address]    Report Status PENDING  Incomplete     Medications:   . calcitonin  280 Units Intramuscular Q12H  . heparin  5,000 Units Subcutaneous Q8H  . piperacillin-tazobactam  3.375 g Intravenous Q8H  . polyethylene glycol  17 g Oral TID  . potassium chloride  40 mEq Oral TID  . sodium chloride flush  3 mL Intravenous Q12H  . vancomycin  750 mg Intravenous Q12H   Continuous Infusions: . sodium chloride 75 mL/hr at 02/29/16 1005    Time spent: 25 min   LOS: 4 days   [name]  [facility] Pager [phone]  *Please refer to [email or web address], password [email or web address] to get updated schedule on who will round on this patient, as hospitalists switch teams weekly. If 7PM-7AM, please contact night-coverage at [email or web address], password [email or web address] for any overnight needs.  02/29/2016, 1:38 PM

## 2016-02-29 NOTE — Progress Notes (Signed)
Pharmacy Antibiotic Note  Johnny Mooney is a 46 y.o. male  with squamous skin cancer of the neck/scalp currently undergoing XRT, presented to the ED on 02/25/16 with c/o generalized pain and was found to have hypercalcemia of malignancy.  Patient was found to be febrile on 02/27/16 with Tmax 103.  Patient's currently on vancomycin and Zosyn day #3 for fever and necrotic / ulceration of lymph nodes in head/neck.  Today, 02/29/2016: - Tmax 102.2, WBC elevated - SCr up slightly 1.31 (crcl~72) - repeat bcx today   Plan: - vancomycin 750 mg IV q12h - 11AM vancomycin dose given late this morning bc pt was off the floor getting radiation. Will re-chedule times in [facility] and check vancomycin trough level at 0130 on 2/6 to assess current regimen.  Pharmacy will f/u and adjust dose as if needed - continue zosyn 3.375 gm IV q8h (infuse over 4 hours) - monitor renal function closely   _______________________________________  Height: 5\' 11"  (180.3 cm) Weight: 155 lb (70.3 kg) IBW/kg (Calculated) : 75.3  Temp (24hrs), Avg:100.5 F (38.1 C), Min:98.7 F (37.1 C), Max:102.2 F (39 C)   Recent Labs Lab 02/25/16 1339 02/25/16 2046 02/25/16 2051 02/25/16 2305 02/26/16 0554 02/27/16 0245 02/28/16 0514 02/28/16 1152 02/29/16 0805  WBC 38.3* 32.6*  --  31.1* 26.6*  --   --  24.4*  --   CREATININE  --   --  1.80* 1.74*  --  1.43* 1.29*  --  1.31*    Estimated Creatinine Clearance: 72.3 mL/min (by C-G formula based on SCr of 1.31 mg/dL (H)).    No Known Allergies   Antimicrobials this admission:  2/3 vanc >>  2/3 zosyn >> 2/3, resume 2/4 >> 2/3 CTX>> 2/4  Dose adjustments this admission:  n/a  Microbiology results:  2/3 BCx x2: ngtd 2/5 bcx x2:   Thank you for allowing pharmacy to be a part of this patient's care.  Lynelle Doctor 02/29/2016 1:49 PM

## 2016-02-29 NOTE — Evaluation (Signed)
Clinical/Bedside Swallow Evaluation Patient Details  Name: Johnny Mooney MRN: [id number] Date of Birth: 1970-07-01  Today's Date: 02/29/2016 Time: SLP Start Time (ACUTE ONLY): 46 SLP Stop Time (ACUTE ONLY): 0955 SLP Time Calculation (min) (ACUTE ONLY): 20 min  Past Medical History:  Past Medical History:  Diagnosis Date  . Cancer ([place]) 11/2015   sq cell skin  . Cellulitis of left lower extremity    dx 09/ 2017 at ED took antibiotic per is healing  . History of traumatic head injury    closed -- no residual  . Subcutaneous mass of head    RIGHT POSTERIOR SCALP--  I&D at ED 09-16-2015 and 10-10-2015   Past Surgical History:  Past Surgical History:  Procedure Laterality Date  . EXCISION MASS HEAD N/A 11/06/2015   Procedure: EXCISION MASS HEAD;  Surgeon: Mickeal Skinner, MD;  [facility];  Service: General;  Laterality: N/A;  . LYMPH NODE BIOPSY N/A 01/07/2016   Procedure: EXCISIONAL LYMPH NODE BIOPSY;  Surgeon: [name], MD;  Location: [facility];  Service: General;  Laterality: N/A;  . MASS EXCISION N/A 01/07/2016   Procedure: EXCISION OF SCALP MASS;  Surgeon: [name], MD;  Location: [facility];  Service: General;  Laterality: N/A;   HPI:  46 year old male admitted 02/25/16 due to poor po intake and pain. PMh significant for TBI, squamous cell skin CA stage IV with mets (currently undergoing radiation). BSE ordered to evaluate swallow function and safety   Assessment / Plan / Recommendation Clinical Impression  Pt presents with adequate oral motor strength and function. CN exam unremarkable. Pt was observed eating breakfast. No overt s/s aspiration observed or reported on any consistency. Lungs CTA, however, pt with 102 temp this morning. Pt's mother reports poor intake recently. Pt confused, perseverating on how he was going to go down for appointments with the catheter in place. ST will follow briefly for assesment of diet  tolerance given fever.     Aspiration Risk  Mild aspiration risk    Diet Recommendation Regular;Thin liquid   Liquid Administration via: Cup;Straw Medication Administration: Whole meds with liquid Supervision: Patient able to self feed Compensations: Minimize environmental distractions;Slow rate;Small sips/bites Postural Changes: Seated upright at 90 degrees;Remain upright for at least 30 minutes after po intake    Other  Recommendations Oral Care Recommendations: Oral care BID   Follow up Recommendations  (TBD)      Frequency and Duration min 1 x/week  1 week       Prognosis Prognosis for Safe Diet Advancement: Good Barriers to Reach Goals: Cognitive deficits      Swallow Study   General Date of Onset: 02/25/16 HPI: 46 year old male admitted 02/25/16 due to poor po intake and pain. PMh significant for TBI, squamous cell skin CA stage IV with mets (currently undergoing radiation). BSE ordered to evaluate swallow function and safety Type of Study: Bedside Swallow Evaluation Previous Swallow Assessment: none Diet Prior to this Study: Regular;Thin liquids Temperature Spikes Noted: Yes Respiratory Status: Room air History of Recent Intubation: No Behavior/Cognition: Alert;Confused;Requires cueing;Distractible Oral Cavity Assessment: Within Functional Limits Oral Care Completed by SLP: No Oral Cavity - Dentition: Adequate natural dentition;Missing dentition Vision: Functional for self-feeding Self-Feeding Abilities: Able to feed self Patient Positioning: Upright in chair Baseline Vocal Quality: Normal Volitional Cough: Strong Volitional Swallow: Able to elicit    Oral/Motor/Sensory Function Overall Oral Motor/Sensory Function: Within functional limits   Ice Chips Ice chips: Not tested  Thin Liquid Thin Liquid: Within functional limits Presentation: Cup    Nectar Thick Nectar Thick Liquid: Not tested   Honey Thick Honey Thick Liquid: Not tested   Puree Puree: Within  functional limits Presentation: Self Fed;Spoon   Solid   GO   Solid: Within functional limits Presentation: [place] [facility], [place] [phone]  Shonna Chock 02/29/2016,9:55 AM

## 2016-02-29 NOTE — Progress Notes (Signed)
Nutrition Follow-up  DOCUMENTATION CODES:   Severe malnutrition in context of acute illness/injury  INTERVENTION:   Monitor phosphorous, magnesium, and potassium daily for at least 3 days. MD to replete as needed, as pt is at risk for refeeding syndrome  Provide Ensure Enlive po BID, each supplement provides 350 kcal and 20 grams of protein Multivitamin with minerals daily Encourage PO intake RD to continue to monitor  NUTRITION DIAGNOSIS:   Malnutrition related to acute illness, cancer and cancer related treatments as evidenced by energy intake < or equal to 50% for > or equal to 5 days, percent weight loss.  Ongoing.  GOAL:   Patient will meet greater than or equal to 90% of their needs  Progressing.  MONITOR:   PO intake, Supplement acceptance, Labs, Weight trends, Skin, I & O's  ASSESSMENT:   46 y.o. 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  Patient in room with multiple family members at bedside. Pt reports he is deciding against a PEG tube. Pt states he is swallowing well. SLP evaluation confirms this. Pt states he is getting ready to call for a lunch tray. Pt has been drinking ensure supplements and is interested in receiving them during this admission. RD to order. Per pt's mother, pt ate grits, pancakes w/ syrup and an egg white this morning for breakfast. Last night for dinner, pt consumed 50% of macaroni & cheese and mashes potatoes per chart review. Reviewed protein supplement options with patient and pt's family.   Medications: Miralax packet TID, K-DUR tablet TID Labs reviewed: Low K  Diet Order:  Diet regular Room service appropriate? Yes; Fluid consistency: Thin  Skin:  Wound (see comment) (Right Head Abscess)  Last BM:   2/3  Height:   Ht Readings from Last 1 Encounters:  02/25/16 5\' 11"  (1.803 m)    Weight:   Wt Readings from Last 1 Encounters:  02/25/16 155 lb (70.3 kg)    Ideal Body Weight:  78 kg  BMI:  Body mass index is 21.62 kg/m.  Estimated Nutritional Needs:   Kcal:  2100-2300 (30-33 kcal/kg)  Protein:  115-125 (1.6-1.8 g/kg)  Fluid:  >/= 2 L/d  EDUCATION NEEDS:   No education needs identified at this time  Clayton Bibles, MS, RD, LDN Pager: [phone] After Hours Pager: [phone]

## 2016-03-01 ENCOUNTER — Encounter: Payer: Self-pay | Admitting: *Deleted

## 2016-03-01 ENCOUNTER — Ambulatory Visit: Admission: RE | Admit: 2016-03-01 | Payer: Medicaid Other | Source: Ambulatory Visit

## 2016-03-01 LAB — BASIC METABOLIC PANEL
ANION GAP: 10 (ref 5–15)
BUN: 7 mg/dL (ref 6–20)
CALCIUM: 6.3 mg/dL — AB (ref 8.9–10.3)
CO2: 19 mmol/L — AB (ref 22–32)
Chloride: 105 mmol/L (ref 101–111)
Creatinine, Ser: 0.99 mg/dL (ref 0.61–1.24)
GFR calc Af Amer: >60 mL/min (ref >60)
GFR calc non Af Amer: >60 mL/min (ref >60)
GLUCOSE: 91 mg/dL (ref 65–99)
Potassium: 3.1 mmol/L — ABNORMAL LOW (ref 3.5–5.1)
Sodium: 134 mmol/L — ABNORMAL LOW (ref 135–145)

## 2016-03-01 LAB — VANCOMYCIN, TROUGH: Vancomycin Tr: 10 ug/mL — ABNORMAL LOW (ref 15–20)

## 2016-03-01 MED ORDER — VANCOMYCIN HCL IN DEXTROSE 1-5 GM/200ML-% IV SOLN
1000.0000 mg | Freq: Two times a day (BID) | INTRAVENOUS | Status: DC
  Administered 2016-03-01 (×2): 1000 mg via INTRAVENOUS
  Filled 2016-03-01 (×3): qty 200

## 2016-03-01 MED ORDER — LORAZEPAM 0.5 MG PO TABS
0.5000 mg | ORAL_TABLET | Freq: Four times a day (QID) | ORAL | Status: DC | PRN
  Administered 2016-03-01 – 2016-03-07 (×5): 0.5 mg via ORAL
  Filled 2016-03-01 (×5): qty 1

## 2016-03-01 NOTE — Progress Notes (Signed)
Pt irritable during all of night shift and difficult to reason with.    Pt upset that call bell is turned on and side rails are up.  Argues with staff.  Demands staff to fluff pillows repeatedly and will not pick up urinal at this time to urinate but expects staff to come in and give him urinal that is within his reach.  Pt's mom requested earlier for pt to be moved to oncology unit.

## 2016-03-01 NOTE — Progress Notes (Signed)
Patient arrived to floor from Radiation Oncology after delaying treatment while there.  Patient alert and oriented to room with mother at bedside.  Patient asked for suction for his mucus however patient also stated "that I'm about to aspirate and can't get anything up."  Suction was set up for the patient and explanation was given on how to use it.  Patient then asked for water but explained to patient if he was feeling like he couldn't safely swallow then he shouldn't drink water.  Patient still had sips of water but then used the yaunker to suck it out.  Patient was given an additional pillow and had no further requests at the time.  Patient is to be taken back down to radiation at 230pm to see if he can be treated.  Patient stable from report and I agree with previous assessment that was charted at 34.  Will continue to monitor.

## 2016-03-01 NOTE — Progress Notes (Signed)
Oncology Nurse Navigator Documentation  Met with Johnny Mooney during RT.  He was brought from 1423 by WC, accompanied by his mother.  Treatment was delayed d/t his concern he was not wearing pants (he was wearing gown), had Foley catheter, might have a BM on the treatment table.  He was provided disposable brief, privacy for placement and transfer to treatment table.  I accompanied him on return to his room, spoke with his RN Hinton Dyer, explained his concerns.  She indicated he had been given opportunity to use bathroom before treatment, has foley d/t Strict I&O status.  She indicated they will offer him paper pants for tomorrow's tmt.  I thanked her for the support.  Gayleen Orem, RN, BSN, [place] [facility] Nurse [place] at [place] [phone]

## 2016-03-01 NOTE — Progress Notes (Signed)
   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  CBCT/MVCT images/Port film x-rays were reviewed.  The chart was checked. No acute effects from RT thus far. He is an inpatient for multiple issues including failure to thrive.  He reports his oral intake is improving.    Physical Findings:  Wt Readings from Last 3 Encounters:  02/25/16 155 lb (70.3 kg)  02/19/16 155 lb 3.2 oz (70.4 kg)  01/30/16 226 lb 3.2 oz (102.6 kg)    height is 5\' 11"  (1.803 m) and weight is 155 lb (70.3 kg). His oral temperature is 99.1 F (37.3 C). His blood pressure is 118/82 and his pulse is 102 (abnormal). His respiration is 20 and oxygen saturation is 100%.   In wheelchair, bulky posterior right neck mass is bandaged.  Limited historian. Mother present.  CBC    Component Value Date/Time   WBC 24.4 (H) 02/28/2016 1152   RBC 3.60 (L) 02/28/2016 1152   HGB 9.5 (L) 02/28/2016 1152   HGB 11.7 (L) 02/25/2016 1339   HCT 27.9 (L) 02/28/2016 1152   HCT 35.4 (L) 02/25/2016 1339   PLT 294 02/28/2016 1152   PLT 414 (H) 02/25/2016 1339   MCV 77.5 (L) 02/28/2016 1152   MCV 80.8 02/25/2016 1339   MCH 26.4 02/28/2016 1152   MCHC 34.1 02/28/2016 1152   RDW 13.7 02/28/2016 1152   RDW 13.7 02/25/2016 1339   LYMPHSABS 2.2 02/25/2016 2046   LYMPHSABS 2.5 02/25/2016 1339   MONOABS 2.1 (H) 02/25/2016 2046   MONOABS 3.0 (H) 02/25/2016 1339   EOSABS 0.0 02/25/2016 2046   EOSABS 0.0 02/25/2016 1339   BASOSABS 0.0 02/25/2016 2046   BASOSABS 0.1 02/25/2016 1339     CMP     Component Value Date/Time   NA 138 02/29/2016 0805   NA 138 02/25/2016 1339   K 2.4 (LL) 02/29/2016 0805   K 3.5 02/25/2016 1339   CL 103 02/29/2016 0805   CO2 23 02/29/2016 0805   CO2 30 (H) 02/25/2016 1339   GLUCOSE 119 (H) 02/29/2016 0805   GLUCOSE 118 02/25/2016  1339   BUN 9 02/29/2016 0805   BUN 72.3 (H) 02/25/2016 1339   CREATININE 1.31 (H) 02/29/2016 0805   CREATININE 2.2 (H) 02/25/2016 1339   CALCIUM 7.1 (L) 02/29/2016 0805   CALCIUM >16.0 (HH) 02/25/2016 1339   PROT 9.1 (H) 02/25/2016 1339   ALBUMIN 3.0 (L) 02/25/2016 1339   AST 61 (H) 02/25/2016 1339   ALT 38 02/25/2016 1339   ALKPHOS 194 (H) 02/25/2016 1339   BILITOT 0.43 02/25/2016 1339   GFRNONAA >60 02/29/2016 0805   GFRAA >60 02/29/2016 0805     Impression:  The patient is tolerating radiotherapy.   Plan:  Continue radiotherapy as planned. I explained again to patient and mother than surgery may be considered after RT for best prognosis.  Patient appears discouraged by this.  I told him we should focus on completing RT first, then reexplore options afterward.  I reiterated this he has a serious, aggressive, advanced disease that will be a challenge to cure.  -----------------------------------  Eppie Gibson, MD

## 2016-03-01 NOTE — Progress Notes (Signed)
CRITICAL VALUE ALERT  Critical value received:  Calcium 6.3  Date of notification:  03/01/16  Time of notification:  [id number]  Critical value read back:Yes.    Nurse who received alert:  Aldean Baker, RN   MD notified (1st page):  Aileen Fass  Time of first page:  1510  MD notified (2nd page):  Time of second page:  Responding MD:  No new orders  Time MD responded:

## 2016-03-01 NOTE — Progress Notes (Signed)
[facility] PROGRESS NOTE    Progress Note  TARICK STANCIL  [id number] DOB: [birth year] DOA: 02/25/2016 PCP: No PCP Per Patient     Brief Narrative:   ANGAD ROESEL is an 46 y.o. male past medical history of squamous cell skin cancer stage IV he was seen by radiation oncology appointment and was urged to come to the ED due to constipation, decreased oral intake for the past several days, in the ED he was found to have a calcium of greater than 16 and acute kidney injury. Became febrile 3 days before 2.3.2018, started on IV Vanc and Zosyn.  Assessment/Plan:   Hypercalcemia of malignancy: Treated with IV fluids calcitonin and zoledronic acid. Calcium is improved today she seems to be euvolemic.  Acute encephalopathy: Resolved, likely due infectious etiology.  Acute kidney injury: With baseline creatinine of less than 1, likely due to hypercalcemia. Creatinine is slowly improving. Continue IV  fluid hydration.  Constipation: Due to hypercalcemia Cont. on MiraLAX 3 times a day. Now resolved  Squamous cell carcinoma of neck Follow-up with radiation and oncology as an outpatient. Continue current pain regimen. Cont XRT. Cont WOC  Hypomagnesemia: Recheck a mag in the morning.  Severe protein caloric malnutrition: Cont Jevity and Regular diet. His girl friend has been eating his food. I have encouraged him to eat his meal. He has refused peg tube.  Sepsis and fever and leukocytosis due to head and neck wound infection: Has remained febrile for 24hrs, Leukocytosis is improving. culture data cont to be negative Cont IV vanc and zosyn, for and additional 24 hrs and change to oral in am CT of the head no metastatic disease and or infectious source.  Pressure speech: Refused to talk to psyq. He refused to speak with psyq, mother relates that he is back to baseline.   DVT prophylaxis: heparin Family Communication:none Disposition Plan/Barrier to D/C:  home once in 24 hr sif remain afebrile. Code Status:     Code Status Orders        Start     Ordered   02/25/16 2224  Full code  Continuous     02/25/16 2223    Code Status History    Date Active Date Inactive Code Status Order ID Comments User Context   01/28/2016 10:31 PM 02/02/2016  9:29 PM Full Code [id number]  Alphonsa Overall, MD Inpatient   02/20/2012 12:59 AM 02/22/2012  3:31 PM Full Code [id number]  Rise Patience, MD Inpatient        IV Access:    Peripheral IV   Procedures and diagnostic studies:   Ct Head Wo Contrast  Result Date: 02/28/2016 CLINICAL DATA:  Metabolic encephalopathy. A subcutaneous mass of the head. EXAM: CT HEAD WITHOUT CONTRAST TECHNIQUE: Contiguous axial images were obtained from the base of the skull through the vertex without intravenous contrast. COMPARISON:  02/17/2016; 12/10/2015; PET-CT - 02/17/2016 FINDINGS: Brain: Gray-white differentiation is maintained. No CT evidence of acute large territory infarct. No intraparenchymal or extra-axial mass or hemorrhage. Unchanged size and configuration of the ventricles and the basilar cisterns. No midline shift. Vascular: No hyperdense vessel or unexpected calcification. Skull: No displaced calvarial fracture. Sinuses/Orbits: Limited visualization of the paranasal sinuses and mastoid air cells is normal. No air-fluid levels. Other: Grossly unchanged fungating subcutaneous mass about the right inferolateral scalp with dominant component measuring approximately 7.1 x 11.2 cm (image 2, series 2). Re- demonstrated associated skin ulceration. IMPRESSION: 1. No acute intracranial process. 2. Unchanged fungating mass involving the  inferolateral aspect the right-side of the scalp with dominant component measuring approximately 11.2 cm. Electronically Signed   By: Sandi Mariscal M.D.   On: 02/28/2016 14:21     Medical Consultants:    None.  Anti-Infectives:   None  Subjective:    ROMIO DEFORD no complains  and comfortable today  Objective:    Vitals:   02/29/16 0713 02/29/16 1418 02/29/16 2218 03/01/16 0407  BP: 106/63 116/74 109/68 118/82  Pulse: (!) 119 (!) 116 (!) 107 (!) 102  Resp: 20 20 20 20   Temp: (!) 102.2 F (39 C) 99.8 F (37.7 C) 99.9 F (37.7 C) 99.1 F (37.3 C)  TempSrc: Oral Oral Oral Oral  SpO2: 97% 100% 100% 100%  Weight:      Height:        Intake/Output Summary (Last 24 hours) at 03/01/16 1219 Last data filed at 03/01/16 0600  Gross per 24 hour  Intake           1117.5 ml  Output              600 ml  Net            517.5 ml   Filed Weights   02/25/16 1526  Weight: 70.3 kg (155 lb)    Exam: General exam: no distressCachectic appearing Respiratory system: Good air movement and clear to auscultation. Cardiovascular system: regular rate rhythm Gastrointestinal system: Abdomen is nondistended, soft and nontender.  Extremities: No pedal edema. Skin: Large mass on the occipital area, ulcerated with purulent drainage Psychiatry: Judgement and insight appear normal. Mood & affect appropriate.   Data Reviewed:    Labs: Basic Metabolic Panel:  Recent Labs Lab 02/25/16 2051 02/25/16 2305 02/27/16 0245 02/28/16 0514 02/29/16 0805  NA 137 136 140 136 138  K 3.2* 3.1* 3.0* 3.0* 2.4*  CL 100* 100* 106 103 103  CO2 28 28 25 22 23   GLUCOSE 98 97 111* 88 119*  BUN 64* 62* 30* 13 9  CREATININE 1.80* 1.74* 1.43* 1.29* 1.31*  CALCIUM 13.2* 12.6* 9.7 7.9* 7.1*  MG 1.6* 1.6* 1.6*  --   --    GFR Estimated Creatinine Clearance: 72.3 mL/min (by C-G formula based on SCr of 1.31 mg/dL (H)). Liver Function Tests:  Recent Labs Lab 02/25/16 1339  AST 61*  ALT 38  ALKPHOS 194*  BILITOT 0.43  PROT 9.1*  ALBUMIN 3.0*   No results for input(s): LIPASE, AMYLASE in the last 168 hours. No results for input(s): AMMONIA in the last 168 hours. Coagulation profile No results for input(s): INR, PROTIME in the last 168 hours.  CBC:  Recent Labs Lab  02/25/16 1339 02/25/16 2046 02/25/16 2305 02/26/16 0554 02/28/16 1152  WBC 38.3* 32.6* 31.1* 26.6* 24.4*  NEUTROABS 32.7* 28.2*  --   --   --   HGB 11.7* 9.7* 9.4* 8.6* 9.5*  HCT 35.4* 28.8* 27.0* 25.1* 27.9*  MCV 80.8 78.5 79.2 79.7 77.5*  PLT 414* 394 371 361 294   Cardiac Enzymes:  Recent Labs Lab 02/25/16 2046  TROPONINI 0.06*   BNP (last 3 results) No results for input(s): PROBNP in the last 8760 hours. CBG: No results for input(s): GLUCAP in the last 168 hours. D-Dimer: No results for input(s): DDIMER in the last 72 hours. Hgb A1c: No results for input(s): HGBA1C in the last 72 hours. Lipid Profile: No results for input(s): CHOL, HDL, LDLCALC, TRIG, CHOLHDL, LDLDIRECT in the last 72 hours. Thyroid function studies: No results  for input(s): TSH, T4TOTAL, T3FREE, THYROIDAB in the last 72 hours.  Invalid input(s): FREET3 Anemia work up: No results for input(s): VITAMINB12, FOLATE, FERRITIN, TIBC, IRON, RETICCTPCT in the last 72 hours. Sepsis Labs:  Recent Labs Lab 02/25/16 2046 02/25/16 2305 02/26/16 0554 02/28/16 1152  WBC 32.6* 31.1* 26.6* 24.4*   Microbiology Recent Results (from the past 240 hour(s))  TECHNOLOGIST REVIEW     Status: None   Collection Time: 02/25/16  1:39 PM  Result Value Ref Range Status   Technologist Review few target cells  Final  Culture, blood (routine x 2)     Status: None (Preliminary result)   Collection Time: 02/27/16  2:36 AM  Result Value Ref Range Status   Specimen Description BLOOD LEFT HAND  Final   Special Requests BOTTLES DRAWN AEROBIC AND ANAEROBIC 5 CC  Final   Culture   Final    NO GROWTH 2 DAYS Performed at [facility], [address], [place], [place] [address]    Report Status PENDING  Incomplete  Culture, blood (routine x 2)     Status: None (Preliminary result)   Collection Time: 02/27/16  2:36 AM  Result Value Ref Range Status   Specimen Description BLOOD RIGHT HAND  Final   Special Requests  BOTTLES DRAWN AEROBIC ONLY 5 CC  Final   Culture   Final    NO GROWTH 2 DAYS Performed at [facility], [address], [place], [place] [address]    Report Status PENDING  Incomplete     Medications:   . calcitonin  280 Units Intramuscular Q12H  . feeding supplement (ENSURE ENLIVE)  237 mL Oral BID BM  . heparin  5,000 Units Subcutaneous Q8H  . multivitamin with minerals  1 tablet Oral Daily  . piperacillin-tazobactam  3.375 g Intravenous Q8H  . polyethylene glycol  17 g Oral TID  . sodium chloride flush  3 mL Intravenous Q12H  . vancomycin  1,000 mg Intravenous Q12H   Continuous Infusions: . sodium chloride 0.9 % 1,000 mL with potassium chloride 40 mEq infusion 75 mL/hr at 03/01/16 0914    Time spent: 25 min   LOS: 5 days   [name]  [facility] Pager [phone]  *Please refer to [email or web address], password [email or web address] to get updated schedule on who will round on this patient, as hospitalists switch teams weekly. If 7PM-7AM, please contact night-coverage at [email or web address], password [email or web address] for any overnight needs.  03/01/2016, 12:19 PM

## 2016-03-01 NOTE — Progress Notes (Signed)
Rx Brief note:  IV Vancomycin  See 2/5 note by A. Pham for full details  Assessement: 0143 VT=10 on 750 mg IV q12h at Css Below goal of 15-20 mg/L Scr stable 1.43>1.29>1.31  Plan: Increase Vancomycin to 1 Gm IV q12h F/u Scr/additional levels as needed  Thanks Dorrene German 03/01/2016 2:38 AM

## 2016-03-01 NOTE — Progress Notes (Signed)
Oncology Nurse Navigator Documentation  Met with Mr. Kliebert when he arrived from 3W for RT.  He was brought in his bed, accompanied by his mother.  [name] met with him, explained expectations for respecting staff, importance of his cooperation to complete treatment in a timely manner.  He voiced understanding.  At his request, he was provided a brief.  He no longer had foley catheter, did not express concern about pants as he was wearing underwear.  He was provided opportunity to blow his nose.  During fitting of mask, he complained of R nare blockage, "I'm not getting enough oxygen".  Nasal canula was provided at 4L/min, affixed to mask.  He continued to state he could not breathe through R nostril.    [name] returned to treatment area, explained that today's treatment will be rescheduled.  She indicated she would call his RN to discuss tomorrow's tmt.  Gayleen Orem, RN, BSN, [place] [facility] Nurse [place] at [place] [phone]

## 2016-03-02 ENCOUNTER — Ambulatory Visit
Admission: RE | Admit: 2016-03-02 | Discharge: 2016-03-02 | Disposition: A | Discharge status: Home / Self Care | Payer: Medicaid Other | Source: Ambulatory Visit | Attending: Radiation Oncology | Admitting: Radiation Oncology

## 2016-03-02 DIAGNOSIS — R509 Fever, unspecified: Secondary | ICD-10-CM

## 2016-03-02 DIAGNOSIS — A419 Sepsis, unspecified organism: Secondary | ICD-10-CM

## 2016-03-02 DIAGNOSIS — E440 Moderate protein-calorie malnutrition: Secondary | ICD-10-CM

## 2016-03-02 DIAGNOSIS — C4442 Squamous cell carcinoma of skin of scalp and neck: Secondary | ICD-10-CM

## 2016-03-02 DIAGNOSIS — N179 Acute kidney failure, unspecified: Secondary | ICD-10-CM

## 2016-03-02 LAB — BASIC METABOLIC PANEL
Anion gap: 9 (ref 5–15)
BUN: 6 mg/dL (ref 6–20)
CHLORIDE: 103 mmol/L (ref 101–111)
CO2: 22 mmol/L (ref 22–32)
Calcium: 6.1 mg/dL — CL (ref 8.9–10.3)
Creatinine, Ser: 0.96 mg/dL (ref 0.61–1.24)
GFR calc Af Amer: >60 mL/min (ref >60)
GFR calc non Af Amer: >60 mL/min (ref >60)
GLUCOSE: 129 mg/dL — AB (ref 65–99)
POTASSIUM: 3.1 mmol/L — AB (ref 3.5–5.1)
SODIUM: 134 mmol/L — AB (ref 135–145)

## 2016-03-02 LAB — MAGNESIUM: Magnesium: 1 mg/dL — ABNORMAL LOW (ref 1.7–2.4)

## 2016-03-02 MED ORDER — CEPHALEXIN 500 MG PO CAPS
500.0000 mg | ORAL_CAPSULE | Freq: Four times a day (QID) | ORAL | Status: DC
  Administered 2016-03-02 (×2): 500 mg via ORAL
  Filled 2016-03-02 (×2): qty 1

## 2016-03-02 MED ORDER — POTASSIUM CHLORIDE CRYS ER 20 MEQ PO TBCR
40.0000 meq | EXTENDED_RELEASE_TABLET | ORAL | Status: AC
  Administered 2016-03-02 (×2): 40 meq via ORAL
  Filled 2016-03-02 (×2): qty 2

## 2016-03-02 MED ORDER — CEPHALEXIN 500 MG PO CAPS: 500.0000 mg | ORAL_CAPSULE | Freq: Four times a day (QID) | ORAL | Status: DC

## 2016-03-02 MED ORDER — MAGNESIUM SULFATE 2 GM/50ML IV SOLN
2.0000 g | Freq: Once | INTRAVENOUS | Status: AC
  Administered 2016-03-02: 2 g via INTRAVENOUS
  Filled 2016-03-02: qty 50

## 2016-03-02 MED ORDER — HYDROMORPHONE HCL 2 MG/ML IJ SOLN
INTRAMUSCULAR | Status: AC
  Filled 2016-03-02: qty 1

## 2016-03-02 MED ORDER — HYDROMORPHONE HCL 2 MG/ML IJ SOLN
0.5000 mg | Freq: Once | INTRAMUSCULAR | Status: AC
  Administered 2016-03-02: 0.5 mg via INTRAVENOUS

## 2016-03-02 NOTE — Progress Notes (Signed)
Speech Language Pathology Treatment: Dysphagia  Patient Details Name: Johnny Mooney MRN: [id number] DOB: 12-02-1970 Today's Date: 03/02/2016 Time: 7255-0016 SLP Time Calculation (min) (ACUTE ONLY): 10 min  Assessment / Plan / Recommendation Clinical Impression  Pt seen at bedside for assessment of diet tolerance. Pt was observed drinking water, with no overt s/s aspiration observed or reported. No further ST intervention recommended at this time. Please reconsult if needs arise in the future.    HPI HPI: 46 year old male admitted 02/25/16 due to poor po intake and pain. PMH significant for TBI, squamous cell skin CA stage IV with mets (currently undergoing radiation). BSE ordered to evaluate swallow function and safety      SLP Plan  Discharge SLP treatment due to (comment);All goals met     Recommendations  Diet recommendations: Regular;Thin liquid Liquids provided via: Cup;Straw Medication Administration: Whole meds with liquid Supervision: Patient able to self feed Compensations: Minimize environmental distractions;Slow rate;Small sips/bites Postural Changes and/or Swallow Maneuvers: Seated upright 90 degrees;Upright 30-60 min after meal                Oral Care Recommendations: Oral care BID Follow up Recommendations: None Plan: Discharge SLP treatment due to (comment);All goals met       GO              Hallie Ishida B. Quentin O[facility], CCC-SLP [phone]  Shonna Chock 03/02/2016, 11:56 AM

## 2016-03-02 NOTE — Progress Notes (Signed)
PROGRESS NOTE    Johnny Mooney  [id number] DOB: January 08, 1971 DOA: 02/25/2016 PCP: No PCP Per Patient    Brief Narrative: Johnny Mooney is a 46 y.o. male past medical history of squamous cell skin cancer stage IV he was seen by radiation oncology appointment and was urged to come to the ED due to constipation, decreased oral intake for the past several days, in the ED he was found to have a calcium of greater than 16 and acute kidney injury. Became febrile 3 days before 2.3.2018, started on IV Vanc and Zosyn. Transitioned to Keflex. Became hypocalcemic.   Assessment & Plan:  Principal Problem:   Hypercalcemia of malignancy Active Problems:   Squamous cell carcinoma of neck   Hypercalcemia   AKI (acute kidney injury) ([place])   Malnutrition of moderate degree   Sepsis (HCC)   Fever   Hypercalcemia of malignancy Resolved with zoledronic acid, IV fluids and calcitonin  Hypocalcemia Secondary to above treatment -discontinue calcitonin -repeat BMP in AM  Acute encephalopathy Resolved  Acute kidney injury Resolved   Constipation Resolved.  Squamous cell carcinoma of neck Currently undergoing XRT -outpatient follow-up  Wound infection Fungated mass infected and treated with vancomycin/zosyn. Appears good today -discontinue vanc/zosyn -keflex -WOC  Hypomagnesemia -Replete  Severe protein calorie malnutrition Refused PEG tube -continue Jevity and regular diet -encouraged to take food by mouth  Pressured speech I observed this as well. Psychiatry consult requested prior but patient refused to see consult.  DVT prophylaxis: Heparin Code Status: Full code Family Communication: Mother at bedside Disposition Plan: Discharge in 2-3 days pending improvement of electrolytes   Consultants:   Radiation oncology  Procedures:  XRT  Antimicrobials:   Vancomycin  Zosyn  Keflex    Subjective: Patient reports no fevers or chills. He does have  some numbness in his fingers. No muscle twitches  Objective: Vitals:   03/01/16 0407 03/01/16 1254 03/01/16 2120 03/02/16 0652  BP: 118/82 (!) 100/48 99/64 102/64  Pulse: (!) 102 (!) 110 100 (!) 103  Resp: 20 20 20 16   Temp: 99.1 F (37.3 C) 98.9 F (37.2 C) 99.5 F (37.5 C) 100.1 F (37.8 C)  TempSrc: Oral Oral Oral Oral  SpO2: 100% 100% 100% 100%  Weight:      Height:        Intake/Output Summary (Last 24 hours) at 03/02/16 1451 Last data filed at 03/01/16 1614  Gross per 24 hour  Intake           1017.5 ml  Output                0 ml  Net           1017.5 ml   Filed Weights   02/25/16 1526  Weight: 70.3 kg (155 lb)    Examination:  General exam: Appears calm and comfortable Respiratory system: Clear to auscultation. Respiratory effort normal. Cardiovascular system: S1 & S2 heard, RRR. No murmurs, rubs, gallops or clicks. Gastrointestinal system: Abdomen is nondistended, soft and nontender.  Normal bowel sounds heard. Central nervous system: Alert and oriented. No focal neurological deficits. Extremities: No edema. No calf tenderness Skin: Large fungated mass on right posterior neck without erythema and chronic appearing wound Psychiatry: Judgement and insight appear normal. Mood & affect appropriate. Speech slightly pressured   Data Reviewed: I have personally reviewed following labs and imaging studies  CBC:  Recent Labs Lab 02/25/16 1339 02/25/16 2046 02/25/16 2305 02/26/16 0554 02/28/16 1152  WBC 38.3* 32.6*  31.1* 26.6* 24.4*  NEUTROABS 32.7* 28.2*  --   --   --   HGB 11.7* 9.7* 9.4* 8.6* 9.5*  HCT 35.4* 28.8* 27.0* 25.1* 27.9*  MCV 80.8 78.5 79.2 79.7 77.5*  PLT 414* 394 371 361 [id number]   Basic Metabolic Panel:  Recent Labs Lab 02/25/16 2051 02/25/16 2305 02/27/16 0245 02/28/16 0514 02/29/16 0805 03/01/16 1418 03/02/16 0448  NA 137 136 140 136 138 134* 134*  K 3.2* 3.1* 3.0* 3.0* 2.4* 3.1* 3.1*  CL 100* 100* 106 103 103 105 103  CO2 28 28  25 22 23  19* 22  GLUCOSE 98 97 111* 88 119* 91 129*  BUN 64* 62* 30* 13 9 7 6   CREATININE 1.80* 1.74* 1.43* 1.29* 1.31* 0.99 0.96  CALCIUM 13.2* 12.6* 9.7 7.9* 7.1* 6.3* 6.1*  MG 1.6* 1.6* 1.6*  --   --   --  1.0*   GFR: Estimated Creatinine Clearance: 98.7 mL/min (by C-G formula based on SCr of 0.96 mg/dL). Liver Function Tests:  Recent Labs Lab 02/25/16 1339  AST 61*  ALT 38  ALKPHOS 194*  BILITOT 0.43  PROT 9.1*  ALBUMIN 3.0*   No results for input(s): LIPASE, AMYLASE in the last 168 hours. No results for input(s): AMMONIA in the last 168 hours. Coagulation Profile: No results for input(s): INR, PROTIME in the last 168 hours. Cardiac Enzymes:  Recent Labs Lab 02/25/16 2046  TROPONINI 0.06*   BNP (last 3 results) No results for input(s): PROBNP in the last 8760 hours. HbA1C: No results for input(s): HGBA1C in the last 72 hours. CBG: No results for input(s): GLUCAP in the last 168 hours. Lipid Profile: No results for input(s): CHOL, HDL, LDLCALC, TRIG, CHOLHDL, LDLDIRECT in the last 72 hours. Thyroid Function Tests: No results for input(s): TSH, T4TOTAL, FREET4, T3FREE, THYROIDAB in the last 72 hours. Anemia Panel: No results for input(s): VITAMINB12, FOLATE, FERRITIN, TIBC, IRON, RETICCTPCT in the last 72 hours. Sepsis Labs: No results for input(s): PROCALCITON, LATICACIDVEN in the last 168 hours.  Recent Results (from the past 240 hour(s))  TECHNOLOGIST REVIEW     Status: None   Collection Time: 02/25/16  1:39 PM  Result Value Ref Range Status   Technologist Review few target cells  Final  Culture, blood (routine x 2)     Status: None (Preliminary result)   Collection Time: 02/27/16  2:36 AM  Result Value Ref Range Status   Specimen Description BLOOD LEFT HAND  Final   Special Requests BOTTLES DRAWN AEROBIC AND ANAEROBIC 5 CC  Final   Culture   Final    NO GROWTH 3 DAYS Performed at [facility], [address] [place], [place] [address]     Report Status PENDING  Incomplete  Culture, blood (routine x 2)     Status: None (Preliminary result)   Collection Time: 02/27/16  2:36 AM  Result Value Ref Range Status   Specimen Description BLOOD RIGHT HAND  Final   Special Requests BOTTLES DRAWN AEROBIC ONLY 5 CC  Final   Culture   Final    NO GROWTH 3 DAYS Performed at [facility] [address] [place], [place] [address]    Report Status PENDING  Incomplete  Culture, blood (routine x 2)     Status: None (Preliminary result)   Collection Time: 02/29/16  7:59 AM  Result Value Ref Range Status   Specimen Description BLOOD RIGHT HAND  Final   Special Requests BOTTLES DRAWN AEROBIC  AND ANAEROBIC 5CC  Final   Culture   Final    NO GROWTH 1 DAY Performed at [facility], [place] [address] [place], [place] [address]    Report Status PENDING  Incomplete  Culture, blood (routine x 2)     Status: None (Preliminary result)   Collection Time: 02/29/16  8:05 AM  Result Value Ref Range Status   Specimen Description BLOOD RIGHT ARM  Final   Special Requests BOTTLES DRAWN AEROBIC AND ANAEROBIC [place]  Final   Culture   Final    NO GROWTH 1 DAY Performed at [facility], [place] [address], [place], [place] [address]    Report Status PENDING  Incomplete         Radiology Studies: No results found.      Scheduled Meds: . cephALEXin  500 mg Oral QID  . feeding supplement (ENSURE ENLIVE)  237 mL Oral BID BM  . heparin  5,000 Units Subcutaneous Q8H  . HYDROmorphone      .  HYDROmorphone (DILAUDID) injection  0.5 mg Intravenous Once  . multivitamin with minerals  1 tablet Oral Daily  . polyethylene glycol  17 g Oral TID  . potassium chloride  40 mEq Oral Q4H  . sodium chloride flush  3 mL Intravenous Q12H   Continuous Infusions:   LOS: 6 days     Cordelia Poche [facility] 03/02/2016, 2:51 PM Pager: [phone]  If 7PM-7AM, please contact night-coverage [email or web address] Password [email or web address] 03/02/2016, 2:51  PM

## 2016-03-03 ENCOUNTER — Ambulatory Visit
Admission: RE | Admit: 2016-03-03 | Discharge: 2016-03-03 | Disposition: A | Discharge status: Home / Self Care | Payer: Medicaid Other | Source: Ambulatory Visit | Attending: Radiation Oncology | Admitting: Radiation Oncology

## 2016-03-03 DIAGNOSIS — K0889 Other specified disorders of teeth and supporting structures: Secondary | ICD-10-CM

## 2016-03-03 DIAGNOSIS — Z833 Family history of diabetes mellitus: Secondary | ICD-10-CM

## 2016-03-03 DIAGNOSIS — R000 Tachycardia, unspecified: Secondary | ICD-10-CM

## 2016-03-03 DIAGNOSIS — R5381 Other malaise: Secondary | ICD-10-CM

## 2016-03-03 DIAGNOSIS — N19 Unspecified kidney failure: Secondary | ICD-10-CM

## 2016-03-03 DIAGNOSIS — R5383 Other fatigue: Secondary | ICD-10-CM

## 2016-03-03 DIAGNOSIS — R531 Weakness: Secondary | ICD-10-CM

## 2016-03-03 DIAGNOSIS — R110 Nausea: Secondary | ICD-10-CM

## 2016-03-03 DIAGNOSIS — R221 Localized swelling, mass and lump, neck: Secondary | ICD-10-CM

## 2016-03-03 DIAGNOSIS — F17290 Nicotine dependence, other tobacco product, uncomplicated: Secondary | ICD-10-CM

## 2016-03-03 DIAGNOSIS — R682 Dry mouth, unspecified: Secondary | ICD-10-CM

## 2016-03-03 DIAGNOSIS — G9341 Metabolic encephalopathy: Secondary | ICD-10-CM

## 2016-03-03 DIAGNOSIS — Z825 Family history of asthma and other chronic lower respiratory diseases: Secondary | ICD-10-CM

## 2016-03-03 DIAGNOSIS — B353 Tinea pedis: Secondary | ICD-10-CM | POA: Diagnosis present

## 2016-03-03 DIAGNOSIS — M542 Cervicalgia: Secondary | ICD-10-CM

## 2016-03-03 DIAGNOSIS — R197 Diarrhea, unspecified: Secondary | ICD-10-CM

## 2016-03-03 DIAGNOSIS — R634 Abnormal weight loss: Secondary | ICD-10-CM

## 2016-03-03 DIAGNOSIS — K5900 Constipation, unspecified: Secondary | ICD-10-CM

## 2016-03-03 LAB — BASIC METABOLIC PANEL
ANION GAP: 11 (ref 5–15)
BUN: 5 mg/dL — AB (ref 6–20)
CO2: 19 mmol/L — AB (ref 22–32)
Calcium: 6 mg/dL — CL (ref 8.9–10.3)
Chloride: 103 mmol/L (ref 101–111)
Creatinine, Ser: 0.94 mg/dL (ref 0.61–1.24)
GFR calc Af Amer: >60 mL/min (ref >60)
GLUCOSE: 115 mg/dL — AB (ref 65–99)
POTASSIUM: 3.6 mmol/L (ref 3.5–5.1)
Sodium: 133 mmol/L — ABNORMAL LOW (ref 135–145)

## 2016-03-03 LAB — CULTURE, BLOOD (ROUTINE X 2)
Culture: NO GROWTH
Culture: NO GROWTH

## 2016-03-03 LAB — MAGNESIUM: Magnesium: 1.3 mg/dL — ABNORMAL LOW (ref 1.7–2.4)

## 2016-03-03 MED ORDER — CALCIUM CARBONATE 1250 (500 CA) MG PO TABS
1.0000 | ORAL_TABLET | Freq: Three times a day (TID) | ORAL | Status: AC
Start: 2016-03-03 — End: 2016-03-04
  Administered 2016-03-03 – 2016-03-04 (×3): 500 mg via ORAL
  Filled 2016-03-03 (×3): qty 1

## 2016-03-03 MED ORDER — TERBINAFINE HCL 1 % EX CREA
TOPICAL_CREAM | Freq: Two times a day (BID) | CUTANEOUS | Status: DC
  Administered 2016-03-04: 1 via TOPICAL
  Administered 2016-03-04: via TOPICAL
  Administered 2016-03-05: 1 via TOPICAL
  Administered 2016-03-06 – 2016-03-07 (×3): via TOPICAL
  Filled 2016-03-03 (×2): qty 12

## 2016-03-03 MED ORDER — DOXYCYCLINE HYCLATE 100 MG PO TABS
100.0000 mg | ORAL_TABLET | Freq: Two times a day (BID) | ORAL | Status: DC
  Administered 2016-03-03: 100 mg via ORAL
  Filled 2016-03-03: qty 1

## 2016-03-03 MED ORDER — MAGNESIUM SULFATE 2 GM/50ML IV SOLN
2.0000 g | Freq: Once | INTRAVENOUS | Status: AC
  Administered 2016-03-03: 2 g via INTRAVENOUS
  Filled 2016-03-03: qty 50

## 2016-03-03 NOTE — Consult Note (Signed)
[facility] Consult   Reason for Consult:  Manic symptoms Referring Physician:  Dr. Lonny Prude Patient Identification: Johnny Mooney MRN:  [id number] Principal Diagnosis: Hypercalcemia of malignancy Diagnosis:   Patient Active Problem List   Diagnosis Date Noted  . Malnutrition of moderate degree [E44.0] 02/27/2016  . Sepsis ([place]) [A41.9] 02/27/2016  . Fever [R50.9] 02/27/2016  . Hypercalcemia of malignancy [E83.52] 02/25/2016  . Hypercalcemia [E83.52] 02/25/2016  . AKI (acute kidney injury) ([place]) [N17.9] 02/25/2016  . Squamous cell carcinoma of neck [C44.42] 01/27/2016  . Tobacco abuse [Z72.0] 02/20/2012    Total Time spent with patient: 1 hour  Subjective:   Johnny Mooney is a 46 y.o. male patient admitted with manic symptoms.  HPI:  Johnny Mooney is a 46 y.o. male, seen, chart reviewed and case discussed with the patient, patient mother who is at bedside and also psychiatric LCSW who participated during this visit. Patient appeared lying on his bed calm and cooperative. Patient denied current symptoms of depression, anxiety, bipolar mania, auditory/visual hallucinations, delusions and paranoia. Patient is somewhat talkative and also occasionally tangential. Patient focused on staff members not paying attention to his needs and also taking one and half minutes before they respond etc. Patient reported he has been suffering with multiple medical problems including athletic foot, bedsores and stage IV squamous cell carcinoma of the skin, undergoing radiation therapy and has been suffering with pain associated with these treatment. Patient has no history of substance abuse or trauma.   Medical history: Patient with medical history significant of squamous cell skin cancer stage IV undergoing radiation therapy who presents on recommendation from radiation oncology therapy. 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 History is mainly gathered from mother and ED notes. He has apparently had decreased food and liquid intake over the past 2-3 days. He has refused to come into the ED for assistance. He was seen at radiation appointment today and was urged to be evaluated in the ED . He currently complains of pain all over, esp in his abdomen due to constipation. He does not readily participate in conversation but is able to tell me that he feels chilled, no chest pain or shortness of breath, has had episode of productive cough yesterday, and admits to nausea, feels like he is getting a stomach ulcer, constipation, decreased urination, no peripheral edema.    Past Psychiatric History: Denied history of psychiatric inpatient or outpatient treatments in the past.  Risk to Self: Is patient at risk for suicide?: No Risk to Others:   Prior Inpatient Therapy:   Prior Outpatient Therapy:    Past Medical History:  Past Medical History:  Diagnosis Date  . Cancer ([place]) 11/2015   sq cell skin  . Cellulitis of left lower extremity    dx 09/ 2017 at ED took antibiotic per is healing  . History of traumatic head injury    closed -- no residual  . Subcutaneous mass of head    RIGHT POSTERIOR SCALP--  I&D at ED 09-16-2015 and 10-10-2015    Past Surgical History:  Procedure Laterality Date  . EXCISION MASS HEAD N/A 11/06/2015   Procedure: EXCISION MASS HEAD;  Surgeon: Mickeal Skinner, MD;  Location: [facility];  Service: General;  Laterality: N/A;  . LYMPH NODE BIOPSY N/A 01/07/2016   Procedure: EXCISIONAL LYMPH NODE BIOPSY;  Surgeon: [name], MD;  Location: [facility];  Service: General;  Laterality: N/A;  . MASS EXCISION N/A 01/07/2016   Procedure: EXCISION OF SCALP MASS;  Surgeon: [name], MD;  Location: [facility];  Service: General;  Laterality: N/A;   Family History:  Family History  Problem Relation Age of Onset  . Diabetes Mellitus II Mother   . Asthma  Father    Family Psychiatric  History: Denied family history of mental illness. Social History:  History  Alcohol Use  . 1.0 oz/week  . 2 Standard drinks or equivalent per week    Comment: couple of beers on the weekends     History  Drug Use No    Comment: former use of marijuana    Social History   Social History  . Marital status: Single    Spouse name: N/A  . Number of children: N/A  . Years of education: N/A   Social History Main Topics  . Smoking status: Current Every Day Smoker    Packs/day: 0.33    Years: 5.00    Types: Cigars  . Smokeless tobacco: Never Used     Comment: quit cigarette smoking 2013, currently smokes 2 cigars daily  . Alcohol use 1.0 oz/week    2 Standard drinks or equivalent per week     Comment: couple of beers on the weekends  . Drug use: No     Comment: former use of marijuana  . Sexual activity: Not Asked   Other Topics Concern  . None   Social History Narrative  . None   Additional Social History:    Allergies:  No Known Allergies  Labs:  Results for orders placed or performed during the hospital encounter of 02/25/16 (from the past 48 hour(s))  Basic metabolic panel     Status: Abnormal   Collection Time: 03/01/16  2:18 PM  Result Value Ref Range   Sodium 134 (L) 135 - 145 mmol/L   Potassium 3.1 (L) 3.5 - 5.1 mmol/L    Comment: DELTA CHECK NOTED   Chloride 105 101 - 111 mmol/L   CO2 19 (L) 22 - 32 mmol/L   Glucose, Bld 91 65 - 99 mg/dL   BUN 7 6 - 20 mg/dL   Creatinine, Ser 0.99 0.61 - 1.24 mg/dL   Calcium 6.3 (LL) 8.9 - 10.3 mg/dL    Comment: CRITICAL RESULT CALLED TO, READ BACK BY AND VERIFIED WITH: R.BALDWIN RN AT 1504 ON 03/01/16 BY S.VANHOORNE    GFR calc non Af Amer >60 >60 mL/min   GFR calc Af Amer >60 >60 mL/min    Comment: (NOTE) The eGFR has been calculated using the CKD EPI equation. This calculation has not been validated in all clinical situations. eGFR's persistently <60 mL/min signify possible Chronic  Kidney Disease.    Anion gap 10 5 - 15  Basic metabolic panel     Status: Abnormal   Collection Time: 03/02/16  4:48 AM  Result Value Ref Range   Sodium 134 (L) 135 - 145 mmol/L   Potassium 3.1 (L) 3.5 - 5.1 mmol/L   Chloride 103 101 - 111 mmol/L   CO2 22 22 - 32 mmol/L   Glucose, Bld 129 (H) 65 - 99 mg/dL   BUN 6 6 - 20 mg/dL   Creatinine, Ser 0.96 0.61 - 1.24 mg/dL   Calcium 6.1 (LL) 8.9 - 10.3 mg/dL    Comment: CRITICAL RESULT CALLED TO, READ BACK BY AND VERIFIED WITH: Trude Mcburney RN 3151 03/02/16 A NAVARRO    GFR calc non Af  Amer >60 >60 mL/min   GFR calc Af Amer >60 >60 mL/min    Comment: (NOTE) The eGFR has been calculated using the CKD EPI equation. This calculation has not been validated in all clinical situations. eGFR's persistently <60 mL/min signify possible Chronic Kidney Disease.    Anion gap 9 5 - 15  Magnesium     Status: Abnormal   Collection Time: 03/02/16  4:48 AM  Result Value Ref Range   Magnesium 1.0 (L) 1.7 - 2.4 mg/dL  Basic metabolic panel     Status: Abnormal   Collection Time: 03/03/16  4:48 AM  Result Value Ref Range   Sodium 133 (L) 135 - 145 mmol/L   Potassium 3.6 3.5 - 5.1 mmol/L   Chloride 103 101 - 111 mmol/L   CO2 19 (L) 22 - 32 mmol/L   Glucose, Bld 115 (H) 65 - 99 mg/dL   BUN 5 (L) 6 - 20 mg/dL   Creatinine, Ser 0.94 0.61 - 1.24 mg/dL   Calcium 6.0 (LL) 8.9 - 10.3 mg/dL    Comment: CRITICAL RESULT CALLED TO, READ BACK BY AND VERIFIED WITH: RIMANDO,J RN 2.8.18 _0  ZANDO,C    GFR calc non Af Amer >60 >60 mL/min   GFR calc Af Amer >60 >60 mL/min    Comment: (NOTE) The eGFR has been calculated using the CKD EPI equation. This calculation has not been validated in all clinical situations. eGFR's persistently <60 mL/min signify possible Chronic Kidney Disease.    Anion gap 11 5 - 15  Magnesium     Status: Abnormal   Collection Time: 03/03/16  4:48 AM  Result Value Ref Range   Magnesium 1.3 (L) 1.7 - 2.4 mg/dL    Current  Facility-Administered Medications  Medication Dose Route Frequency Provider Last Rate Last Dose  . acetaminophen (TYLENOL) tablet 650 mg  650 mg Oral Q6H PRN [name], MD   650 mg at 03/03/16 0240  . doxycycline (VIBRA-TABS) tablet 100 mg  100 mg Oral Q12H Mariel Aloe, MD   100 mg at 03/03/16 1028  . feeding supplement (ENSURE ENLIVE) (ENSURE ENLIVE) liquid 237 mL  237 mL Oral BID BM [name], MD   237 mL at 03/02/16 1002  . heparin injection 5,000 Units  5,000 Units Subcutaneous [place], DO   5,000 Units at 03/03/16 [phone]  . LORazepam (ATIVAN) tablet 0.5 mg  0.5 mg Oral Q6H PRN [name], MD   0.5 mg at 03/03/16 1028  . multivitamin with minerals tablet 1 tablet  1 tablet Oral Daily [name], MD   1 tablet at 03/03/16 1028  . ondansetron (ZOFRAN) tablet 4 mg  4 mg Oral Q6H PRN Shon Millet, DO       Or  . ondansetron [facility]) injection 4 mg  4 mg Intravenous Q6H PRN Shon Millet, DO      . oxyCODONE (ROXICODONE) 5 MG/5ML solution 5 mg  5 mg Oral Q4H PRN [name], MD   5 mg at 03/03/16 1028  . polyethylene glycol (MIRALAX / GLYCOLAX) packet 17 g  17 g Oral TID [name], MD   17 g at 03/03/16 1028  . 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 Patient denied nausea, vomiting, abdomen pain, shortness of breath. No Fever-chills, No Headache, No changes with Vision or hearing, reports vertigo No problems swallowing food or Liquids, No Chest pain, Cough or Shortness of Breath, No Abdominal pain, No Nausea or Vommitting, Bowel  movements are regular, No Blood in stool or Urine, No dysuria, No new skin rashes or bruises, No new joints pains-aches,  No new weakness, tingling, numbness in any extremity, No recent weight gain or loss, No polyuria, polydypsia or polyphagia,   A full 10 point Review of Systems was done, except as stated above, all other Review of Systems were negative.  Blood pressure 108/61, pulse (!) 103, temperature 99.6 F (37.6 C), temperature source Oral, resp. rate 20, height _0  (1.803 m), weight 70.3 kg (155 lb), SpO2 100 %.Body mass index is 21.62 kg/m.  General Appearance: Casual  Eye Contact:  Good  Speech:  Clear and Coherent and more talkative and detail oriented  Volume:  Decreased  Mood:  Anxious  Affect:  Appropriate and Congruent  Thought Process:  Coherent and Goal Directed  Orientation:  Full (Time, Place, and Person)  Thought Content:  Tangential  Suicidal Thoughts:  No  Homicidal Thoughts:  No  Memory:  Immediate;   Good Recent;   Fair Remote;   Fair  Judgement:  Good  Insight:  Good  Psychomotor Activity:  Decreased  Concentration:  Concentration: Fair and Attention Span: Fair  Recall:  Good  Fund of Knowledge:  Good  Language:  Good  Akathisia:  Negative  Handed:  Right  AIMS (if indicated):     Assets:  Communication Skills Desire for Improvement Financial Resources/Insurance Housing Leisure Time Resilience Social Support Transportation  ADL's:  Intact  Cognition:  WNL  Sleep:        Treatment Plan Summary: Patient is a 46 years old male with squamous cell carcinoma stage IV, undergoing radiation therapy and also suffering with headaches staying his therapies and environment. Patient mother is at bedside who is supportive to him. Patient denied suicidal/homicidal ideation, intention or plans. Patient has no evidence of psychosis.  Patient has no safety concerns. Patient benefit from supportive therapy [place] with the lorazepam 0.5 mg every 6  hours as needed for anxiety Appreciate psychiatric consultation and we sign off as of today Please contact [phone] or [phone] if needs further assistance   Disposition: No evidence of imminent risk to self or others at present.   Supportive therapy provided about ongoing stressors.  Ambrose Finland, MD 03/03/2016 12:19 PM

## 2016-03-03 NOTE — Progress Notes (Signed)
PT Cancellation Note  Patient Details Name: Johnny Mooney MRN: [id number] DOB: [birth year]   Cancelled Treatment:    Reason Eval/Treat Not Completed: Patient at procedure or test/unavailable   Claretha Cooper 03/03/2016, 2:25 PM Tresa Endo PT [phone]

## 2016-03-03 NOTE — Progress Notes (Signed)
PROGRESS NOTE    EZIAH Mooney  [id number] DOB: 04-Nov-1970 DOA: 02/25/2016 PCP: No PCP Per Patient    Brief Narrative: Johnny Mooney is a 46 y.o. male past medical history of squamous cell skin cancer stage IV he was seen by radiation oncology appointment and was urged to come to the ED due to constipation, decreased oral intake for the past several days, in the ED he was found to have a calcium of greater than 16 and acute kidney injury. Became febrile 3 days before 2.3.2018, started on IV Vanc and Zosyn. Transitioned to Keflex. Became hypocalcemic.   Assessment & Plan:  Principal Problem:   Hypercalcemia of malignancy Active Problems:   Squamous cell carcinoma of neck   Hypercalcemia   AKI (acute kidney injury) ([place])   Malnutrition of moderate degree   Sepsis (HCC)   Fever   Hypercalcemia of malignancy Resolved with zoledronic acid, IV fluids and calcitonin  Hypocalcemia Secondary to above treatment. Calcitonin discontinued -add a few doses of calcium carbonate -repeat BMP in AM -check ionized calcium  Acute encephalopathy Resolved  Acute kidney injury Resolved   Constipation Resolved.  Squamous cell carcinoma of neck Currently undergoing XRT -outpatient follow-up -will likely need surgery for debulking. From the looks of pictures just one month ago, this lesions grew very quickly  ?Wound infection Fevers Fungated mass infected and treated with vancomycin/zosyn initially. Switched to keflex and now doxycycline. Febrile overnight. -discontinue keflex -doxycycline -WOC -infectious disease consult  Hypomagnesemia -Replete  Severe protein calorie malnutrition Refused PEG tube -continue Jevity and regular diet -encouraged to take food by mouth  Pressured speech Anxiety I observed this as well. Psychiatry consult requested prior but patient refused to see consult. -consult psychiatry  Tinea pedis -terbinafine cream bid  DVT  prophylaxis: Heparin Code Status: Full code Family Communication: Mother at bedside Disposition Plan: Discharge in 2-3 days pending improvement of electrolytes   Consultants:   Radiation oncology  Procedures:  XRT  Antimicrobials:   Vancomycin  Zosyn  Keflex  Doxycycline   Subjective: No twitching but has some numbness in fingers.  Objective: Vitals:   03/02/16 2113 03/03/16 0235 03/03/16 0240 03/03/16 0651  BP: 115/72   108/61  Pulse: (!) 116   (!) 103  Resp: 20   20  Temp: (!) 101.1 F (38.4 C) (!) 102.7 F (39.3 C) (!) 101.8 F (38.8 C) 99.6 F (37.6 C)  TempSrc: Oral Oral Oral Oral  SpO2: 98%   100%  Weight:      Height:        Intake/Output Summary (Last 24 hours) at 03/03/16 1219 Last data filed at 03/02/16 2114  Gross per 24 hour  Intake              480 ml  Output                0 ml  Net              480 ml   Filed Weights   02/25/16 1526  Weight: 70.3 kg (155 lb)    Examination:  General exam: Appears calm and comfortable Respiratory system: Clear to auscultation. Respiratory effort normal. Cardiovascular system: S1 & S2 heard, RRR. No murmurs, rubs, gallops or clicks. Gastrointestinal system: Abdomen is nondistended, soft and nontender.  Normal bowel sounds heard. Central nervous system: Alert and oriented. No focal neurological deficits. Extremities: No edema. No calf tenderness Skin: Large fungated mass on right posterior neck without erythema and chronic  appearing wound. Hyperkeratotic rash on bilateral soles and digits of feet Psychiatry: Judgement and insight appear impaired. Mood & affect anxious. Speech slightly pressured. Agitated       Data Reviewed: I have personally reviewed following labs and imaging studies  CBC:  Recent Labs Lab 02/25/16 1339 02/25/16 2046 02/25/16 2305 02/26/16 0554 02/28/16 1152  WBC 38.3* 32.6* 31.1* 26.6* 24.4*  NEUTROABS 32.7* 28.2*  --   --   --   HGB 11.7* 9.7* 9.4* 8.6* 9.5*  HCT  35.4* 28.8* 27.0* 25.1* 27.9*  MCV 80.8 78.5 79.2 79.7 77.5*  PLT 414* 394 371 361 [id number]   Basic Metabolic Panel:  Recent Labs Lab 02/25/16 2051 02/25/16 2305 02/27/16 0245 02/28/16 0514 02/29/16 0805 03/01/16 1418 03/02/16 0448 03/03/16 0448  NA 137 136 140 136 138 134* 134* 133*  K 3.2* 3.1* 3.0* 3.0* 2.4* 3.1* 3.1* 3.6  CL 100* 100* 106 103 103 105 103 103  CO2 28 28 25 22 23  19* 22 19*  GLUCOSE 98 97 111* 88 119* 91 129* 115*  BUN 64* 62* 30* 13 9 7 6  5*  CREATININE 1.80* 1.74* 1.43* 1.29* 1.31* 0.99 0.96 0.94  CALCIUM 13.2* 12.6* 9.7 7.9* 7.1* 6.3* 6.1* 6.0*  MG 1.6* 1.6* 1.6*  --   --   --  1.0* 1.3*   GFR: Estimated Creatinine Clearance: 100.8 mL/min (by C-G formula based on SCr of 0.94 mg/dL). Liver Function Tests:  Recent Labs Lab 02/25/16 1339  AST 61*  ALT 38  ALKPHOS 194*  BILITOT 0.43  PROT 9.1*  ALBUMIN 3.0*   No results for input(s): LIPASE, AMYLASE in the last 168 hours. No results for input(s): AMMONIA in the last 168 hours. Coagulation Profile: No results for input(s): INR, PROTIME in the last 168 hours. Cardiac Enzymes:  Recent Labs Lab 02/25/16 2046  TROPONINI 0.06*   BNP (last 3 results) No results for input(s): PROBNP in the last 8760 hours. HbA1C: No results for input(s): HGBA1C in the last 72 hours. CBG: No results for input(s): GLUCAP in the last 168 hours. Lipid Profile: No results for input(s): CHOL, HDL, LDLCALC, TRIG, CHOLHDL, LDLDIRECT in the last 72 hours. Thyroid Function Tests: No results for input(s): TSH, T4TOTAL, FREET4, T3FREE, THYROIDAB in the last 72 hours. Anemia Panel: No results for input(s): VITAMINB12, FOLATE, FERRITIN, TIBC, IRON, RETICCTPCT in the last 72 hours. Sepsis Labs: No results for input(s): PROCALCITON, LATICACIDVEN in the last 168 hours.  Recent Results (from the past 240 hour(s))  TECHNOLOGIST REVIEW     Status: None   Collection Time: 02/25/16  1:39 PM  Result Value Ref Range Status    Technologist Review few target cells  Final  Culture, blood (routine x 2)     Status: None (Preliminary result)   Collection Time: 02/27/16  2:36 AM  Result Value Ref Range Status   Specimen Description BLOOD LEFT HAND  Final   Special Requests BOTTLES DRAWN AEROBIC AND ANAEROBIC 5 CC  Final   Culture   Final    NO GROWTH 4 DAYS Performed at [facility], [address], [place], [place] [address]    Report Status PENDING  Incomplete  Culture, blood (routine x 2)     Status: None (Preliminary result)   Collection Time: 02/27/16  2:36 AM  Result Value Ref Range Status   Specimen Description BLOOD RIGHT HAND  Final   Special Requests BOTTLES DRAWN AEROBIC ONLY 5 CC  Final   Culture  Final    NO GROWTH 4 DAYS Performed at [facility], [place] [address] [place], [place] [address]    Report Status PENDING  Incomplete  Culture, blood (routine x 2)     Status: None (Preliminary result)   Collection Time: 02/29/16  7:59 AM  Result Value Ref Range Status   Specimen Description BLOOD RIGHT HAND  Final   Special Requests BOTTLES DRAWN AEROBIC AND ANAEROBIC 5CC  Final   Culture   Final    NO GROWTH 2 DAYS Performed at [facility], [place] [address], [place], [place] [address]    Report Status PENDING  Incomplete  Culture, blood (routine x 2)     Status: None (Preliminary result)   Collection Time: 02/29/16  8:05 AM  Result Value Ref Range Status   Specimen Description BLOOD RIGHT ARM  Final   Special Requests BOTTLES DRAWN AEROBIC AND ANAEROBIC [place]  Final   Culture   Final    NO GROWTH 2 DAYS Performed at [facility], [place] [address] [place], [place] [address]    Report Status PENDING  Incomplete         Radiology Studies: No results found.      Scheduled Meds: . doxycycline  100 mg Oral Q12H  . feeding supplement (ENSURE ENLIVE)  237 mL Oral BID BM  . heparin  5,000 Units Subcutaneous Q8H  . multivitamin with minerals  1 tablet Oral Daily  .  polyethylene glycol  17 g Oral TID  . sodium chloride flush  3 mL Intravenous Q12H   Continuous Infusions:   LOS: 7 days     Cordelia Poche [facility] 03/03/2016, 12:19 PM Pager: [phone]  If 7PM-7AM, please contact night-coverage [email or web address] Password [email or web address] 03/03/2016, 12:19 PM

## 2016-03-03 NOTE — Progress Notes (Signed)
[facility] for Infectious Disease    Date of Admission:  02/25/2016    Intermittent antibiotics since late December        Day 8 antibiotics this admission        Day day 1 doxycycline               Reason for Consult: Intermittent fevers    Referring Physician: Dr. Cordelia Poche  Principal Problem:   Fever Active Problems:   Tobacco abuse   Squamous cell carcinoma of neck   Hypercalcemia of malignancy   Hypercalcemia   AKI (acute kidney injury) ([place])   Malnutrition of moderate degree   Sepsis (HCC)   Tinea pedis   . calcium carbonate  1 tablet Oral TID WC  . doxycycline  100 mg Oral Q12H  . feeding supplement (ENSURE ENLIVE)  237 mL Oral BID BM  . heparin  5,000 Units Subcutaneous Q8H  . multivitamin with minerals  1 tablet Oral Daily  . polyethylene glycol  17 g Oral TID  . sodium chloride flush  3 mL Intravenous Q12H  . terbinafine   Topical BID    Recommendations: 1. Discontinue doxycycline and observe off antibiotics for now  2. HIV antibody 3. Repeat CBC with differential  Assessment: The cause of his fever is unclear. He has not had any obvious improvement with a variety of empiric antibiotics. I'm concerned about the possibility of tumor fever. He has had some diarrhea recently but I suspect that that was related MiraLAX given for his constipation. He has nothing to suggest a drug allergy. I favor stopping antibiotics for now. I will follow up in the morning.    HPI: Johnny Mooney is a 46 y.o. male who developed a right posterior scalp mass last fall. He was seen in the emergency department on several occasions and underwent incision and drainage for presumed abscess but the mass continued to enlarge. He underwent excision of the mass and mid October revealing squamous cell carcinoma. He returned in December and the mass had recurred with enlarging lymph nodes in his neck. He underwent repeat surgery with reexcision of the primary mass and  attempted lymph node dissection. Postoperatively he developed some evidence of wound infection. Notes indicate that he was treated with cephalexin in December. He was then switched to doxycycline. He was admitted from January 4-9 and treated with IV vancomycin. He was discharged on oral amoxicillin clavulanate. As he was just starting radiation therapy he was noted to be progressively weak. He was readmitted on 02/25/2016 with increased pain, decreased by mouth intake, fever, chills or abdominal pain, nausea and constipation. He was noted to be hypercalcemic. He was started on empiric vancomycin and piperacillin tazobactam. Blood cultures were negative. Chest x-ray showed no acute abnormalities. CT scan of his neck showed an enlarging fungating mass without abscess. He has continued to have intermittent fevers. His antibiotics were narrowed to cephalexin yesterday but he had more fever last night. He was changed to doxycycline today.   Review of Systems: Review of Systems  Constitutional: Positive for chills, fever, malaise/fatigue and weight loss. Negative for diaphoresis.  HENT: Negative for sore throat.        Dry mouth.  Respiratory: Negative for cough, sputum production and shortness of breath.   Cardiovascular: Negative for chest pain.  Gastrointestinal: Positive for constipation, diarrhea and nausea. Negative for abdominal pain, heartburn and vomiting.  Genitourinary: Negative for dysuria and frequency.  Musculoskeletal:  Positive for neck pain. Negative for joint pain and myalgias.  Skin: Negative for rash.       He is bothered by very dry skin on his feet.  Neurological: Positive for sensory change and weakness. Negative for dizziness and headaches.    Past Medical History:  Diagnosis Date  . Cancer ([place]) 11/2015   sq cell skin  . Cellulitis of left lower extremity    dx 09/ 2017 at ED took antibiotic per is healing  . History of traumatic head injury    closed -- no residual  .  Subcutaneous mass of head    RIGHT POSTERIOR SCALP--  I&D at ED 09-16-2015 and 10-10-2015    Social History  Substance Use Topics  . Smoking status: Current Every Day Smoker    Packs/day: 0.33    Years: 5.00    Types: Cigars  . Smokeless tobacco: Never Used     Comment: quit cigarette smoking 2013, currently smokes 2 cigars daily  . Alcohol use 1.0 oz/week    2 Standard drinks or equivalent per week     Comment: couple of beers on the weekends    Family History  Problem Relation Age of Onset  . Diabetes Mellitus II Mother   . Asthma Father    No Known Allergies  OBJECTIVE: Blood pressure 125/76, pulse (!) 115, temperature 100.1 F (37.8 C), temperature source Oral, resp. rate 20, height 5\' 11"  (1.803 m), weight 155 lb (70.3 kg), SpO2 100 %.  Physical Exam  Constitutional:  He was initially standing in his room pacing with his mother present. He became upset when he smelled the alcohol gel on my hands and asked to wash it off. He then climbed into bed on top of 10-15 pillows and laid on his left side. His speech was somewhat rambling. He is somewhat groggy after having received pain medication.  HENT:  Dry mouth with poor dentition. There is a large hard mass on his right posterior neck covered with a gauze dressing that is clean and dry. He does not want me to root move the dressing.  Eyes:  Some conjunctival injection bilaterally.  Cardiovascular: Regular rhythm.   No murmur heard. He is tachycardic.  Pulmonary/Chest: Effort normal and breath sounds normal. He has no wheezes. He has no rales.  Abdominal: Soft. There is no tenderness.  Musculoskeletal: Normal range of motion. He exhibits no edema or tenderness.  Neurological: He is alert.  Skin: No rash noted.    Lab Results Lab Results  Component Value Date   WBC 24.4 (H) 02/28/2016   HGB 9.5 (L) 02/28/2016   HCT 27.9 (L) 02/28/2016   MCV 77.5 (L) 02/28/2016   PLT 294 02/28/2016    Lab Results  Component Value  Date   CREATININE 0.94 03/03/2016   BUN 5 (L) 03/03/2016   NA 133 (L) 03/03/2016   K 3.6 03/03/2016   CL 103 03/03/2016   CO2 19 (L) 03/03/2016    Lab Results  Component Value Date   ALT 38 02/25/2016   AST 61 (H) 02/25/2016   ALKPHOS 194 (H) 02/25/2016   BILITOT 0.43 02/25/2016     Microbiology: Recent Results (from the past 240 hour(s))  TECHNOLOGIST REVIEW     Status: None   Collection Time: 02/25/16  1:39 PM  Result Value Ref Range Status   Technologist Review few target cells  Final  Culture, blood (routine x 2)     Status: None   Collection Time: 02/27/16  2:36 AM  Result Value Ref Range Status   Specimen Description BLOOD LEFT HAND  Final   Special Requests BOTTLES DRAWN AEROBIC AND ANAEROBIC 5 CC  Final   Culture   Final    NO GROWTH 5 DAYS Performed at [facility], [address] [place], [place] [address]    Report Status 03/03/2016 FINAL  Final  Culture, blood (routine x 2)     Status: None   Collection Time: 02/27/16  2:36 AM  Result Value Ref Range Status   Specimen Description BLOOD RIGHT HAND  Final   Special Requests BOTTLES DRAWN AEROBIC ONLY 5 CC  Final   Culture   Final    NO GROWTH 5 DAYS Performed at [facility] [address], [place], [place] [address]    Report Status 03/03/2016 FINAL  Final  Culture, blood (routine x 2)     Status: None (Preliminary result)   Collection Time: 02/29/16  7:59 AM  Result Value Ref Range Status   Specimen Description BLOOD RIGHT HAND  Final   Special Requests BOTTLES DRAWN AEROBIC AND ANAEROBIC 5CC  Final   Culture   Final    NO GROWTH 3 DAYS Performed at [facility], [place] [address] [place], [place] [address]    Report Status PENDING  Incomplete  Culture, blood (routine x 2)     Status: None (Preliminary result)   Collection Time: 02/29/16  8:05 AM  Result Value Ref Range Status   Specimen Description BLOOD RIGHT ARM  Final   Special Requests BOTTLES DRAWN AEROBIC AND ANAEROBIC [place]   Final   Culture   Final    NO GROWTH 3 DAYS Performed at [facility], [place] [address] [place], [place] [address]    Report Status PENDING  Incomplete    Michel Bickers, MD [facility] for [facility] [phone] pager   [phone] cell 03/03/2016, 5:08 PM

## 2016-03-04 ENCOUNTER — Encounter: Payer: Self-pay | Admitting: *Deleted

## 2016-03-04 ENCOUNTER — Ambulatory Visit
Admission: RE | Admit: 2016-03-04 | Discharge: 2016-03-04 | Disposition: A | Discharge status: Home / Self Care | Payer: Medicaid Other | Source: Ambulatory Visit | Attending: Radiation Oncology | Admitting: Radiation Oncology

## 2016-03-04 DIAGNOSIS — Z6821 Body mass index (BMI) 21.0-21.9, adult: Secondary | ICD-10-CM

## 2016-03-04 DIAGNOSIS — L853 Xerosis cutis: Secondary | ICD-10-CM

## 2016-03-04 LAB — CBC WITH DIFFERENTIAL/PLATELET
BASOS ABS: 0 10*3/uL (ref 0.0–0.1)
Basophils Relative: 0 %
EOS PCT: 0 %
Eosinophils Absolute: 0.1 10*3/uL (ref 0.0–0.7)
HCT: 24.4 % — ABNORMAL LOW (ref 39.0–52.0)
HEMOGLOBIN: 8.6 g/dL — AB (ref 13.0–17.0)
Lymphocytes Relative: 5 %
Lymphs Abs: 1.1 10*3/uL (ref 0.7–4.0)
MCH: 27 pg (ref 26.0–34.0)
MCHC: 35.2 g/dL (ref 30.0–36.0)
MCV: 76.7 fL — ABNORMAL LOW (ref 78.0–100.0)
Monocytes Absolute: 1.8 10*3/uL — ABNORMAL HIGH (ref 0.1–1.0)
Monocytes Relative: 9 %
NEUTROS PCT: 86 %
Neutro Abs: 17.7 10*3/uL — ABNORMAL HIGH (ref 1.7–7.7)
Platelets: 342 10*3/uL (ref 150–400)
RBC: 3.18 MIL/uL — AB (ref 4.22–5.81)
RDW: 14.4 % (ref 11.5–15.5)
WBC: 20.6 10*3/uL — AB (ref 4.0–10.5)

## 2016-03-04 LAB — BASIC METABOLIC PANEL
Anion gap: 12 (ref 5–15)
BUN: 5 mg/dL — AB (ref 6–20)
CHLORIDE: 98 mmol/L — AB (ref 101–111)
CO2: 21 mmol/L — ABNORMAL LOW (ref 22–32)
CREATININE: 0.96 mg/dL (ref 0.61–1.24)
Calcium: 6 mg/dL — CL (ref 8.9–10.3)
Glucose, Bld: 158 mg/dL — ABNORMAL HIGH (ref 65–99)
POTASSIUM: 2.9 mmol/L — AB (ref 3.5–5.1)
SODIUM: 131 mmol/L — AB (ref 135–145)

## 2016-03-04 LAB — MAGNESIUM: MAGNESIUM: 1 mg/dL — AB (ref 1.7–2.4)

## 2016-03-04 MED ORDER — MAGNESIUM CHLORIDE 64 MG PO TBEC
2.0000 | DELAYED_RELEASE_TABLET | Freq: Two times a day (BID) | ORAL | Status: DC
  Administered 2016-03-04 – 2016-03-07 (×7): 128 mg via ORAL
  Filled 2016-03-04 (×8): qty 2

## 2016-03-04 MED ORDER — MAGNESIUM SULFATE 2 GM/50ML IV SOLN
2.0000 g | Freq: Once | INTRAVENOUS | Status: AC
  Administered 2016-03-04: 2 g via INTRAVENOUS
  Filled 2016-03-04 (×2): qty 50

## 2016-03-04 MED ORDER — NAPROXEN 500 MG PO TABS
500.0000 mg | ORAL_TABLET | Freq: Two times a day (BID) | ORAL | Status: DC
  Administered 2016-03-04 – 2016-03-07 (×6): 500 mg via ORAL
  Filled 2016-03-04 (×10): qty 1

## 2016-03-04 MED ORDER — HYDROMORPHONE HCL 2 MG PO TABS
2.0000 mg | ORAL_TABLET | Freq: Once | ORAL | Status: DC
  Filled 2016-03-04 (×2): qty 1

## 2016-03-04 MED ORDER — CALCIUM CARBONATE 1250 (500 CA) MG PO TABS
2.0000 | ORAL_TABLET | Freq: Two times a day (BID) | ORAL | Status: AC
  Administered 2016-03-04: 500 mg via ORAL
  Administered 2016-03-04: 1000 mg via ORAL
  Filled 2016-03-04 (×2): qty 1

## 2016-03-04 MED ORDER — HYDROMORPHONE HCL 2 MG PO TABS
2.0000 mg | ORAL_TABLET | Freq: Once | ORAL | Status: AC
  Administered 2016-03-04: 2 mg via ORAL
  Filled 2016-03-04: qty 1

## 2016-03-04 MED ORDER — POTASSIUM CHLORIDE CRYS ER 20 MEQ PO TBCR
40.0000 meq | EXTENDED_RELEASE_TABLET | ORAL | Status: AC
  Administered 2016-03-04 (×2): 40 meq via ORAL
  Filled 2016-03-04 (×2): qty 2

## 2016-03-04 NOTE — Progress Notes (Signed)
PROGRESS NOTE    Johnny Mooney  [id number] DOB: 02-14-1970 DOA: 02/25/2016 PCP: No PCP Per Patient    Brief Narrative: Johnny Mooney is a 46 y.o. male past medical history of squamous cell skin cancer stage IV he was seen by radiation oncology appointment and was urged to come to the ED due to constipation, decreased oral intake for the past several days, in the ED he was found to have a calcium of greater than 16 and acute kidney injury. Became febrile 3 days before 2.3.2018, started on IV Vanc and Zosyn. Transitioned to Keflex. Became hypocalcemic.   Assessment & Plan:  Principal Problem:   Fever Active Problems:   Tobacco abuse   Squamous cell carcinoma of neck   Hypercalcemia of malignancy   Hypercalcemia   AKI (acute kidney injury) ([place])   Malnutrition of moderate degree   Sepsis (HCC)   Tinea pedis   Hypercalcemia of malignancy Resolved with zoledronic acid, IV fluids and calcitonin  Hypocalcemia Secondary to above treatment. Calcitonin discontinued -continue calcium supplementation -daily BMP in AM -ionized calcium pending  Acute encephalopathy Resolved  Acute kidney injury Resolved   Constipation Resolved.  Squamous cell carcinoma of neck Currently undergoing XRT -outpatient follow-up -will likely need surgery for debulking. From the looks of pictures just one month ago, this lesions grew very quickly  ?Wound infection Fevers Fungated mass infected and treated with vancomycin/zosyn initially. Switched to keflex and now doxycycline. Febrile overnight. -doxycycline discontinued -WOC -infectious disease recommendations: discontinue antibiotics. ?tumor fever  Hypomagnesemia -IV repletion x1 -oral magnesium supplementation  Hypokalemia -replete  Severe protein calorie malnutrition Refused PEG tube -continue Jevity and regular diet -encouraged to take food by mouth  Pressured speech Anxiety Psychiatry without recommendations.  No evidence of psychosis per evaluation by psychiatry  Tinea pedis -continue terbinafine cream bid  DVT prophylaxis: Heparin Code Status: Full code Family Communication: Mother at bedside Disposition Plan: Discharge in 2-3 days pending improvement of electrolytes   Consultants:   Radiation oncology  Procedures:  XRT  Antimicrobials:   Vancomycin  Zosyn  Keflex  Doxycycline   Subjective: Pain overnight. Febrile overnight.  Objective: Vitals:   03/03/16 1459 03/03/16 2146 03/04/16 0000 03/04/16 1348  BP: 125/76 121/61  130/71  Pulse: (!) 115 (!) 119  (!) 117  Resp: 20 18  20   Temp: 100.1 F (37.8 C) (!) 102.7 F (39.3 C) (!) 101.6 F (38.7 C) (!) 101.8 F (38.8 C)  TempSrc: Oral Oral  Oral  SpO2: 100% 100%  100%  Weight:      Height:        Intake/Output Summary (Last 24 hours) at 03/04/16 1637 Last data filed at 03/04/16 1624  Gross per 24 hour  Intake              600 ml  Output                0 ml  Net              600 ml   Filed Weights   02/25/16 1526  Weight: 70.3 kg (155 lb)    Examination:  General exam: Appears calm and comfortable Respiratory system: Clear to auscultation. Respiratory effort normal. Cardiovascular system: S1 & S2 heard, RRR. No murmurs, rubs, gallops or clicks. Gastrointestinal system: Abdomen is nondistended, soft and nontender.  Normal bowel sounds heard. Central nervous system: Alert and oriented. No focal neurological deficits. Extremities: No edema. No calf tenderness Skin: Large fungated mass on  right posterior neck without erythema and chronic appearing wound. Hyperkeratotic rash on bilateral soles and digits of feet Psychiatry: Judgement and insight appear impaired. Mood & affect anxious. Speech slightly pressured. Agitated    Data Reviewed: I have personally reviewed following labs and imaging studies  CBC:  Recent Labs Lab 02/28/16 1152 03/04/16 0502  WBC 24.4* 20.6*  NEUTROABS  --  17.7*  HGB 9.5*  8.6*  HCT 27.9* 24.4*  MCV 77.5* 76.7*  PLT 294 [id number]   Basic Metabolic Panel:  Recent Labs Lab 02/27/16 0245  02/29/16 0805 03/01/16 1418 03/02/16 0448 03/03/16 0448 03/04/16 0502  NA 140  < > 138 134* 134* 133* 131*  K 3.0*  < > 2.4* 3.1* 3.1* 3.6 2.9*  CL 106  < > 103 105 103 103 98*  CO2 25  < > 23 19* 22 19* 21*  GLUCOSE 111*  < > 119* 91 129* 115* 158*  BUN 30*  < > 9 7 6  5* 5*  CREATININE 1.43*  < > 1.31* 0.99 0.96 0.94 0.96  CALCIUM 9.7  < > 7.1* 6.3* 6.1* 6.0* 6.0*  MG 1.6*  --   --   --  1.0* 1.3* 1.0*  < > = values in this interval not displayed. GFR: Estimated Creatinine Clearance: 98.7 mL/min (by C-G formula based on SCr of 0.96 mg/dL). Liver Function Tests: No results for input(s): AST, ALT, ALKPHOS, BILITOT, PROT, ALBUMIN in the last 168 hours. No results for input(s): LIPASE, AMYLASE in the last 168 hours. No results for input(s): AMMONIA in the last 168 hours. Coagulation Profile: No results for input(s): INR, PROTIME in the last 168 hours. Cardiac Enzymes: No results for input(s): CKTOTAL, CKMB, CKMBINDEX, TROPONINI in the last 168 hours. BNP (last 3 results) No results for input(s): PROBNP in the last 8760 hours. HbA1C: No results for input(s): HGBA1C in the last 72 hours. CBG: No results for input(s): GLUCAP in the last 168 hours. Lipid Profile: No results for input(s): CHOL, HDL, LDLCALC, TRIG, CHOLHDL, LDLDIRECT in the last 72 hours. Thyroid Function Tests: No results for input(s): TSH, T4TOTAL, FREET4, T3FREE, THYROIDAB in the last 72 hours. Anemia Panel: No results for input(s): VITAMINB12, FOLATE, FERRITIN, TIBC, IRON, RETICCTPCT in the last 72 hours. Sepsis Labs: No results for input(s): PROCALCITON, LATICACIDVEN in the last 168 hours.  Recent Results (from the past 240 hour(s))  TECHNOLOGIST REVIEW     Status: None   Collection Time: 02/25/16  1:39 PM  Result Value Ref Range Status   Technologist Review few target cells  Final  Culture,  blood (routine x 2)     Status: None   Collection Time: 02/27/16  2:36 AM  Result Value Ref Range Status   Specimen Description BLOOD LEFT HAND  Final   Special Requests BOTTLES DRAWN AEROBIC AND ANAEROBIC 5 CC  Final   Culture   Final    NO GROWTH 5 DAYS Performed at [facility], [address] [place], [place] [address]    Report Status 03/03/2016 FINAL  Final  Culture, blood (routine x 2)     Status: None   Collection Time: 02/27/16  2:36 AM  Result Value Ref Range Status   Specimen Description BLOOD RIGHT HAND  Final   Special Requests BOTTLES DRAWN AEROBIC ONLY 5 CC  Final   Culture   Final    NO GROWTH 5 DAYS Performed at [facility] [address] [place], [place] [address]  Report Status 03/03/2016 FINAL  Final  Culture, blood (routine x 2)     Status: None (Preliminary result)   Collection Time: 02/29/16  7:59 AM  Result Value Ref Range Status   Specimen Description BLOOD RIGHT HAND  Final   Special Requests BOTTLES DRAWN AEROBIC AND ANAEROBIC 5CC  Final   Culture   Final    NO GROWTH 4 DAYS Performed at [facility], [place] [address] [place], [place] [address]    Report Status PENDING  Incomplete  Culture, blood (routine x 2)     Status: None (Preliminary result)   Collection Time: 02/29/16  8:05 AM  Result Value Ref Range Status   Specimen Description BLOOD RIGHT ARM  Final   Special Requests BOTTLES DRAWN AEROBIC AND ANAEROBIC [place]  Final   Culture   Final    NO GROWTH 4 DAYS Performed at [facility], [place] [address], [place], [place] [address]    Report Status PENDING  Incomplete         Radiology Studies: No results found.      Scheduled Meds: . feeding supplement (ENSURE ENLIVE)  237 mL Oral BID BM  . heparin  5,000 Units Subcutaneous Q8H  . HYDROmorphone  2 mg Oral Once  . magnesium chloride  2 tablet Oral BID  . magnesium sulfate 1 - 4 g bolus IVPB  2 g Intravenous Once  . multivitamin with minerals  1 tablet  Oral Daily  . naproxen  500 mg Oral BID WC  . polyethylene glycol  17 g Oral TID  . sodium chloride flush  3 mL Intravenous Q12H  . terbinafine   Topical BID   Continuous Infusions:   LOS: 8 days     Cordelia Poche [facility] 03/04/2016, 4:37 PM Pager: [phone]  If 7PM-7AM, please contact night-coverage [email or web address] Password [email or web address] 03/04/2016, 4:37 PM

## 2016-03-04 NOTE — Progress Notes (Signed)
Oncology Nurse Navigator Documentation  Visited Mr. Johnny Mooney [id number] to check on his well-being.  His mother was at the bedside. During my visit he ambulated without assist, no signs of instability. His mother reported he has been eating well, drinking water. He expressed concern his dressing needs changing, the wait is too long when he requests pain medication, linens need changing.  I indicated I would address his concerns with his RN Festus Holts and did so after my visit.  I observed NT Brandi enter to change linens. I recognized his success in completing treatments over the past several days, encouraged him to continue doing so. He understands I will continue to follow him during his admission.  Gayleen Orem, RN, BSN, [place] [facility] Nurse [place] [facility] [place] [phone]

## 2016-03-04 NOTE — Progress Notes (Signed)
Patient now agrees to take his oral meds that he missed.  Patient continues to state that he does not want a lot of pain meds to "dope him up and make him out of it." Patient  Agrees that he will take oxycodone with dressing change only.

## 2016-03-04 NOTE — Progress Notes (Signed)
Patient ID: Johnny Mooney, male   DOB: 1970/12/11, 46 y.o.   MRN: [id number]          [facility]  Date of Admission:  02/25/2016     Principal Problem:   Fever Active Problems:   Tobacco abuse   Squamous cell carcinoma of neck   Hypercalcemia of malignancy   Hypercalcemia   AKI (acute kidney injury) ([place])   Malnutrition of moderate degree   Sepsis (HCC)   Tinea pedis   . feeding supplement (ENSURE ENLIVE)  237 mL Oral BID BM  . heparin  5,000 Units Subcutaneous Q8H  . multivitamin with minerals  1 tablet Oral Daily  . polyethylene glycol  17 g Oral TID  . sodium chloride flush  3 mL Intravenous Q12H  . terbinafine   Topical BID    SUBJECTIVE: He had a lot of pain with the dressing change on his right posterior neck mass this morning. His nurse, Johnny Mooney, notes there was only a scant amount of pink drainage. There is no purulence. He denies having any new cough or shortness of breath. He has not had any more diarrhea since he stopped taking MiraLAX several days ago. He does not have any dysuria. He has no new rash or itching.  Review of Systems: Review of Systems  Constitutional: Positive for malaise/fatigue and weight loss. Negative for chills, diaphoresis and fever.  HENT: Negative for sore throat.        Dry mouth.  Respiratory: Negative for cough, sputum production and shortness of breath.   Cardiovascular: Negative for chest pain.  Gastrointestinal: Negative for abdominal pain, diarrhea, heartburn, nausea and vomiting.  Genitourinary: Negative for dysuria and frequency.  Musculoskeletal: Negative for joint pain and myalgias.  Skin: Negative for rash.       Very dry skin.  Neurological: Negative for dizziness and headaches.  Psychiatric/Behavioral: Negative for depression and substance abuse. The patient is not nervous/anxious.     Past Medical History:  Diagnosis Date  . Cancer ([place]) 11/2015   sq cell skin  . Cellulitis of left  lower extremity    dx 09/ 2017 at ED took antibiotic per is healing  . History of traumatic head injury    closed -- no residual  . Subcutaneous mass of head    RIGHT POSTERIOR SCALP--  I&D at ED 09-16-2015 and 10-10-2015    Social History  Substance Use Topics  . Smoking status: Current Every Day Smoker    Packs/day: 0.33    Years: 5.00    Types: Cigars  . Smokeless tobacco: Never Used     Comment: quit cigarette smoking 2013, currently smokes 2 cigars daily  . Alcohol use 1.0 oz/week    2 Standard drinks or equivalent per week     Comment: couple of beers on the weekends    Family History  Problem Relation Age of Onset  . Diabetes Mellitus II Mother   . Asthma Father    No Known Allergies  OBJECTIVE: Vitals:   03/03/16 0651 03/03/16 1459 03/03/16 2146 03/04/16 0000  BP: 108/61 125/76 121/61   Pulse: (!) 103 (!) 115 (!) 119   Resp: 20 20 18    Temp: 99.6 F (37.6 C) 100.1 F (37.8 C) (!) 102.7 F (39.3 C) (!) 101.6 F (38.7 C)  TempSrc: Oral Oral Oral   SpO2: 100% 100% 100%   Weight:      Height:       Body mass index is  21.62 kg/m.  Physical Exam  Constitutional: He is oriented to person, place, and time.  He is resting quietly in bed. He is very calm this morning and in no distress. His mother is at the bedside.  HENT:  Mouth/Throat: No oropharyngeal exudate.  Neck:  No change in the large, bulky posterior neck mass.  Cardiovascular: Normal rate and regular rhythm.   No murmur heard. Pulmonary/Chest: Effort normal and breath sounds normal. He has no wheezes. He has no rales.  Abdominal: Soft. There is no tenderness.  Musculoskeletal: Normal range of motion. He exhibits no edema or tenderness.  Neurological: He is alert and oriented to person, place, and time.  Skin: No rash noted.  IV site looks good.  Psychiatric: Mood and affect normal.    Lab Results Lab Results  Component Value Date   WBC 20.6 (H) 03/04/2016   HGB 8.6 (L) 03/04/2016   HCT  24.4 (L) 03/04/2016   MCV 76.7 (L) 03/04/2016   PLT 342 03/04/2016    Lab Results  Component Value Date   CREATININE 0.96 03/04/2016   BUN 5 (L) 03/04/2016   NA 131 (L) 03/04/2016   K 2.9 (L) 03/04/2016   CL 98 (L) 03/04/2016   CO2 21 (L) 03/04/2016    Lab Results  Component Value Date   ALT 38 02/25/2016   AST 61 (H) 02/25/2016   ALKPHOS 194 (H) 02/25/2016   BILITOT 0.43 02/25/2016     Microbiology: Recent Results (from the past 240 hour(s))  TECHNOLOGIST REVIEW     Status: None   Collection Time: 02/25/16  1:39 PM  Result Value Ref Range Status   Technologist Review few target cells  Final  Culture, blood (routine x 2)     Status: None   Collection Time: 02/27/16  2:36 AM  Result Value Ref Range Status   Specimen Description BLOOD LEFT HAND  Final   Special Requests BOTTLES DRAWN AEROBIC AND ANAEROBIC 5 CC  Final   Culture   Final    NO GROWTH 5 DAYS Performed at [facility], [address] [place], [place] [address]    Report Status 03/03/2016 FINAL  Final  Culture, blood (routine x 2)     Status: None   Collection Time: 02/27/16  2:36 AM  Result Value Ref Range Status   Specimen Description BLOOD RIGHT HAND  Final   Special Requests BOTTLES DRAWN AEROBIC ONLY 5 CC  Final   Culture   Final    NO GROWTH 5 DAYS Performed at [facility] [address], [place], [place] [address]    Report Status 03/03/2016 FINAL  Final  Culture, blood (routine x 2)     Status: None (Preliminary result)   Collection Time: 02/29/16  7:59 AM  Result Value Ref Range Status   Specimen Description BLOOD RIGHT HAND  Final   Special Requests BOTTLES DRAWN AEROBIC AND ANAEROBIC 5CC  Final   Culture   Final    NO GROWTH 3 DAYS Performed at [facility], [place] [address] [place],  [address]    Report Status PENDING  Incomplete  Culture, blood (routine x 2)     Status: None (Preliminary result)   Collection Time: 02/29/16  8:05 AM  Result Value Ref Range  Status   Specimen Description BLOOD RIGHT ARM  Final   Special Requests BOTTLES DRAWN AEROBIC AND ANAEROBIC [place]  Final   Culture   Final    NO GROWTH  3 DAYS Performed at [facility], [place] [address] [place], [place] [address]    Report Status PENDING  Incomplete     ASSESSMENT: He has persistent, intermittent fevers without any obvious source of infection at this time. There is no evidence of infection of his neck mass by exam or recent CT scans. He has no evidence of pneumonia, diarrhea, UTI, IV associated infection or allergic drug reaction. Head and neck cancers have been associated with paraneoplastic syndromes such as hypercalcemia, dry mouth and tumor fever. I favor continued observation off of antibiotics and a trial of Naprosyn. He is in agreement with that plan.  PLAN: 1.  Naprosyn 500 mg twice daily with food 2. observe off of antibiotics 3. Please call me for any infectious disease questions this weekend  Michel Bickers, MD [facility] [place] [phone] pager   [phone] cell 03/04/2016, 10:07 AM

## 2016-03-04 NOTE — Progress Notes (Signed)
Temp 101.8 Dr. Lonny Prude made aware regimen initiated per infectious disease MD for recurrent high temp. No other intervention at this time.

## 2016-03-04 NOTE — Progress Notes (Signed)
Nurse at bedside to administer medications, patient tells nurse to stop and let him see what was in her pockets. Nurse explained to patient that she was there with prescribed medications and patient insists that nurse has a purple packet which the nurse does not have. Patient states that he does not want the meds right now and gets up and walks into the restroom and shuts door. Md notified.

## 2016-03-04 NOTE — Progress Notes (Signed)
Patient continues to refuse the one time order of dilaudid previously requested. Patient states that he wants to continue with the oxycodone when he gets dressing changes only for fear of addiction due to previous addiction issues.

## 2016-03-04 NOTE — Progress Notes (Signed)
Wound care done by girlfriend per patients request. Nurse observed girlfriend change wet to dry dressing.

## 2016-03-04 NOTE — Clinical Social Work Psych Note (Addendum)
Clinical Social Worker Psych Service Line Progress Note  Clinical Social Worker: Lia Hopping, LCSW Date/Time: 03/04/2016, 8:10 AM   Review of Patient  Overall Medical Condition:   Participation Level:  Active Participation Quality: Appropriate Other Participation Quality:  Pleasant  Affect: Appropriate Cognitive: Appropriate Reaction to Medications/Concerns: Patient reports no concerns  Modes of Intervention: Support, Exploration   Summary of Progress/Plan at Discharge  Summary of Progress/Plan at Discharge: LCSWA and psychiatrist met with patient, mother at bedside, explained role and reason for visit. Patient agreeable to talk. Patient expressed his stressors with hospital stay and his current medical state. Patient discussed the support of his family and friends while he has been sick. Patient denies SI/HI ideations. Patient was appreciative of visit.  Patient expresses no social needs at this time.  Psych service line-LCSWA will sign off at this time.

## 2016-03-04 NOTE — Progress Notes (Signed)
PT Cancellation Note  Patient Details Name: Johnny Mooney MRN: [id number] DOB: [birth year]   Cancelled Treatment:    Reason Eval/Treat Not Completed: PT screened, no needs identified, will sign off. No PT needs-observed pt up ambulating in room without difficulty. Pt denied need as well. Will sign off.   Weston Anna, MPT Pager: [phone]

## 2016-03-05 DIAGNOSIS — Z720 Tobacco use: Secondary | ICD-10-CM

## 2016-03-05 LAB — CULTURE, BLOOD (ROUTINE X 2)
Culture: NO GROWTH
Culture: NO GROWTH

## 2016-03-05 LAB — BASIC METABOLIC PANEL
ANION GAP: 10 (ref 5–15)
BUN: 13 mg/dL (ref 6–20)
CHLORIDE: 100 mmol/L — AB (ref 101–111)
CO2: 20 mmol/L — ABNORMAL LOW (ref 22–32)
Calcium: 6.6 mg/dL — ABNORMAL LOW (ref 8.9–10.3)
Creatinine, Ser: 1.09 mg/dL (ref 0.61–1.24)
GFR calc Af Amer: >60 mL/min (ref >60)
Glucose, Bld: 117 mg/dL — ABNORMAL HIGH (ref 65–99)
POTASSIUM: 3.9 mmol/L (ref 3.5–5.1)
Sodium: 130 mmol/L — ABNORMAL LOW (ref 135–145)

## 2016-03-05 LAB — MAGNESIUM: MAGNESIUM: 1.6 mg/dL — AB (ref 1.7–2.4)

## 2016-03-05 LAB — HIV ANTIBODY (ROUTINE TESTING W REFLEX): HIV SCREEN 4TH GENERATION: NONREACTIVE

## 2016-03-05 LAB — CALCIUM, IONIZED: Calcium, Ionized, Serum: 3.3 mg/dL — ABNORMAL LOW (ref 4.5–5.6)

## 2016-03-05 NOTE — Progress Notes (Signed)
It is hard to describe this patients attitude, rudeness, and over reaction to every circumstance and the way his family walks on eggshells to prevent an explosion. I have been out of the room an hour and a half and the family is still trying to convince the patient that the way I put lotion on his feet is okay. He constantly is looking for an argument and gets angrier when you avert the argument. Will intrude on them as little as possible while still making sure he is safe, vss. Hoyle Barr

## 2016-03-05 NOTE — Progress Notes (Signed)
PROGRESS NOTE    DAVID BALDI  [id number] DOB: 29-Apr-1970 DOA: 02/25/2016 PCP: No PCP Per Patient    Brief Narrative: Johnny Mooney is a 46 y.o. male past medical history of squamous cell skin cancer stage IV he was seen by radiation oncology appointment and was urged to come to the ED due to constipation, decreased oral intake for the past several days, in the ED he was found to have a calcium of greater than 16 and acute kidney injury. Became febrile 3 days before 2.3.2018, started on IV Vanc and Zosyn. Transitioned to Keflex. Became hypocalcemic.   Assessment & Plan:  Principal Problem:   Fever Active Problems:   Tobacco abuse   Squamous cell carcinoma of neck   Hypercalcemia of malignancy   Hypercalcemia   AKI (acute kidney injury) ([place])   Malnutrition of moderate degree   Sepsis (HCC)   Tinea pedis   Hypercalcemia of malignancy Resolved with zoledronic acid, IV fluids and calcitonin  Hypocalcemia Secondary to above treatment. Calcitonin discontinued. Improving. -continue calcium supplementation -daily BMP in AM -ionized calcium pending  Acute encephalopathy Resolved  Acute kidney injury Resolved   Constipation Resolved.  Squamous cell carcinoma of neck Currently undergoing XRT. Tumor appears to be shrinking. -outpatient follow-up -will likely need surgery for debulking. From the looks of pictures just one month ago, this lesions grew very quickly  ?Wound infection Fevers Fungated mass infected and treated with vancomycin/zosyn initially. No antibiotics per infectious disease. Started on naproxen. Afebrile overnight. -WOC -infectious disease recommendations: discontinue antibiotics. ?tumor fever  Hypomagnesemia -oral magnesium supplementation -recheck in AM  Hypokalemia Resolved today  Severe protein calorie malnutrition Refused PEG tube -continue Jevity and regular diet -encouraged to take food by mouth  Pressured  speech Anxiety Psychiatry without recommendations. No evidence of psychosis per evaluation by psychiatry  Tinea pedis Improving -continue terbinafine cream bid  DVT prophylaxis: Heparin Code Status: Full code Family Communication: Mother at bedside Disposition Plan: Discharge in 1-2 days pending improvement of electrolytes   Consultants:   Radiation oncology  Procedures:  XRT  Antimicrobials:   Vancomycin  Zosyn  Keflex  Doxycycline   Subjective: No pain overnight. Foot rash is improving.   Objective: Vitals:   03/04/16 1348 03/04/16 2327 03/05/16 0449 03/05/16 1341  BP: 130/71 121/88 99/70 116/85  Pulse: (!) 117 97 98 100  Resp: 20 20 18 20   Temp: (!) 101.8 F (38.8 C) 99.2 F (37.3 C) 97.5 F (36.4 C) 98.9 F (37.2 C)  TempSrc: Oral Oral Oral Oral  SpO2: 100% 100% 100% 100%  Weight:      Height:        Intake/Output Summary (Last 24 hours) at 03/05/16 1537 Last data filed at 03/05/16 0450  Gross per 24 hour  Intake              960 ml  Output                0 ml  Net              960 ml   Filed Weights   02/25/16 1526  Weight: 70.3 kg (155 lb)    Examination:  General exam: Appears calm and comfortable. Sitting in couch with mother Respiratory system: Clear to auscultation. Respiratory effort normal. Cardiovascular system: S1 & S2 heard, RRR. No murmurs, rubs, gallops or clicks. Gastrointestinal system: Abdomen is nondistended, soft and nontender.  Normal bowel sounds heard. Central nervous system: Alert and oriented. No focal  neurological deficits. Extremities: No edema. No calf tenderness Skin: Large fungated mass on right posterior neck without erythema and chronic appearing wound. Hyperkeratotic rash on bilateral soles and digits of feet Psychiatry: Judgement and insight appear impaired. Mood & affect anxious. Speech slightly pressured. Agitated    Data Reviewed: I have personally reviewed following labs and imaging  studies  CBC:  Recent Labs Lab 02/28/16 1152 03/04/16 0502  WBC 24.4* 20.6*  NEUTROABS  --  17.7*  HGB 9.5* 8.6*  HCT 27.9* 24.4*  MCV 77.5* 76.7*  PLT 294 [id number]   Basic Metabolic Panel:  Recent Labs Lab 03/01/16 1418 03/02/16 0448 03/03/16 0448 03/04/16 0502 03/05/16 0423  NA 134* 134* 133* 131* 130*  K 3.1* 3.1* 3.6 2.9* 3.9  CL 105 103 103 98* 100*  CO2 19* 22 19* 21* 20*  GLUCOSE 91 129* 115* 158* 117*  BUN 7 6 5* 5* 13  CREATININE 0.99 0.96 0.94 0.96 1.09  CALCIUM 6.3* 6.1* 6.0* 6.0* 6.6*  MG  --  1.0* 1.3* 1.0* 1.6*   GFR: Estimated Creatinine Clearance: 86.9 mL/min (by C-G formula based on SCr of 1.09 mg/dL). Liver Function Tests: No results for input(s): AST, ALT, ALKPHOS, BILITOT, PROT, ALBUMIN in the last 168 hours. No results for input(s): LIPASE, AMYLASE in the last 168 hours. No results for input(s): AMMONIA in the last 168 hours. Coagulation Profile: No results for input(s): INR, PROTIME in the last 168 hours. Cardiac Enzymes: No results for input(s): CKTOTAL, CKMB, CKMBINDEX, TROPONINI in the last 168 hours. BNP (last 3 results) No results for input(s): PROBNP in the last 8760 hours. HbA1C: No results for input(s): HGBA1C in the last 72 hours. CBG: No results for input(s): GLUCAP in the last 168 hours. Lipid Profile: No results for input(s): CHOL, HDL, LDLCALC, TRIG, CHOLHDL, LDLDIRECT in the last 72 hours. Thyroid Function Tests: No results for input(s): TSH, T4TOTAL, FREET4, T3FREE, THYROIDAB in the last 72 hours. Anemia Panel: No results for input(s): VITAMINB12, FOLATE, FERRITIN, TIBC, IRON, RETICCTPCT in the last 72 hours. Sepsis Labs: No results for input(s): PROCALCITON, LATICACIDVEN in the last 168 hours.  Recent Results (from the past 240 hour(s))  TECHNOLOGIST REVIEW     Status: None   Collection Time: 02/25/16  1:39 PM  Result Value Ref Range Status   Technologist Review few target cells  Final  Culture, blood (routine x 2)      Status: None   Collection Time: 02/27/16  2:36 AM  Result Value Ref Range Status   Specimen Description BLOOD LEFT HAND  Final   Special Requests BOTTLES DRAWN AEROBIC AND ANAEROBIC 5 CC  Final   Culture   Final    NO GROWTH 5 DAYS Performed at [facility], [address] [place], [place] [address]    Report Status 03/03/2016 FINAL  Final  Culture, blood (routine x 2)     Status: None   Collection Time: 02/27/16  2:36 AM  Result Value Ref Range Status   Specimen Description BLOOD RIGHT HAND  Final   Special Requests BOTTLES DRAWN AEROBIC ONLY 5 CC  Final   Culture   Final    NO GROWTH 5 DAYS Performed at [facility] [address], [place], [place] [address]    Report Status 03/03/2016 FINAL  Final  Culture, blood (routine x 2)     Status: None   Collection Time: 02/29/16  7:59 AM  Result Value Ref Range Status   Specimen Description  BLOOD RIGHT HAND  Final   Special Requests BOTTLES DRAWN AEROBIC AND ANAEROBIC 5CC  Final   Culture   Final    NO GROWTH 5 DAYS Performed at [facility], [place] [address] [place], [place] [address]    Report Status 03/05/2016 FINAL  Final  Culture, blood (routine x 2)     Status: None   Collection Time: 02/29/16  8:05 AM  Result Value Ref Range Status   Specimen Description BLOOD RIGHT ARM  Final   Special Requests BOTTLES DRAWN AEROBIC AND ANAEROBIC [place]  Final   Culture   Final    NO GROWTH 5 DAYS Performed at [facility], [place] [address] [place], [place] [address]    Report Status 03/05/2016 FINAL  Final         Radiology Studies: No results found.      Scheduled Meds: . feeding supplement (ENSURE ENLIVE)  237 mL Oral BID BM  . heparin  5,000 Units Subcutaneous Q8H  . HYDROmorphone  2 mg Oral Once  . magnesium chloride  2 tablet Oral BID  . multivitamin with minerals  1 tablet Oral Daily  . naproxen  500 mg Oral BID WC  . polyethylene glycol  17 g Oral TID  . sodium chloride flush  3 mL  Intravenous Q12H  . terbinafine   Topical BID   Continuous Infusions:   LOS: 9 days     Cordelia Poche [facility] 03/05/2016, 3:37 PM Pager: [phone]  If 7PM-7AM, please contact night-coverage [email or web address] Password [facility] 03/05/2016, 3:37 PM

## 2016-03-06 LAB — BASIC METABOLIC PANEL
Anion gap: 9 (ref 5–15)
BUN: 15 mg/dL (ref 6–20)
CALCIUM: 6.7 mg/dL — AB (ref 8.9–10.3)
CO2: 22 mmol/L (ref 22–32)
Chloride: 101 mmol/L (ref 101–111)
Creatinine, Ser: 0.86 mg/dL (ref 0.61–1.24)
GFR calc Af Amer: >60 mL/min (ref >60)
GLUCOSE: 126 mg/dL — AB (ref 65–99)
POTASSIUM: 3.9 mmol/L (ref 3.5–5.1)
SODIUM: 132 mmol/L — AB (ref 135–145)

## 2016-03-06 LAB — ALBUMIN: Albumin: 2.3 g/dL — ABNORMAL LOW (ref 3.5–5.0)

## 2016-03-06 LAB — MAGNESIUM: MAGNESIUM: 1.6 mg/dL — AB (ref 1.7–2.4)

## 2016-03-06 MED ORDER — CALCIUM CARBONATE 1250 (500 CA) MG PO TABS
2.0000 | ORAL_TABLET | Freq: Two times a day (BID) | ORAL | Status: DC
  Administered 2016-03-06 – 2016-03-07 (×3): 1000 mg via ORAL
  Filled 2016-03-06 (×4): qty 1

## 2016-03-06 MED ORDER — ENOXAPARIN SODIUM 40 MG/0.4ML ~~LOC~~ SOLN: 40.0000 mg | SUBCUTANEOUS | Status: DC

## 2016-03-06 MED ORDER — MAGNESIUM SULFATE 2 GM/50ML IV SOLN
2.0000 g | Freq: Once | INTRAVENOUS | Status: AC
  Administered 2016-03-06: 2 g via INTRAVENOUS
  Filled 2016-03-06: qty 50

## 2016-03-06 NOTE — Progress Notes (Signed)
PROGRESS NOTE    Johnny Mooney  [id number] DOB: [birth year] DOA: 02/25/2016 PCP: No PCP Per Patient    Brief Narrative: Johnny Mooney is a 46 y.o. male past medical history of squamous cell skin cancer stage IV he was seen by radiation oncology appointment and was urged to come to the ED due to constipation, decreased oral intake for the past several days, in the ED he was found to have a calcium of greater than 16 and acute kidney injury. Became febrile 3 days before 2.3.2018, started on IV Vanc and Zosyn. Transitioned to Keflex. Became hypocalcemic.   Assessment & Plan:  Principal Problem:   Fever Active Problems:   Tobacco abuse   Squamous cell carcinoma of neck   Hypercalcemia of malignancy   Hypercalcemia   AKI (acute kidney injury) ([place])   Malnutrition of moderate degree   Sepsis (HCC)   Tinea pedis   Hypercalcemia of malignancy Resolved with zoledronic acid, IV fluids and calcitonin  Hypocalcemia Secondary to above treatment. Calcitonin discontinued. Improving. -continue calcium supplementation -daily BMP in AM  Acute encephalopathy Resolved  Acute kidney injury Resolved   Constipation Resolved.  Squamous cell carcinoma of neck Currently undergoing XRT. Tumor appears to be shrinking. -outpatient follow-up -will likely need surgery for debulking. From the looks of pictures just one month ago, this lesions grew very quickly  ?Wound infection Fevers Fungated mass infected and treated with vancomycin/zosyn initially. No antibiotics per infectious disease. Started on naproxen. Afebrile overnight. -WOC -infectious disease recommendations: discontinue antibiotics. ?tumor fever  Hypomagnesemia -oral magnesium supplementation -IV magnesium x1 dose -recheck in AM  Hypokalemia Stable  Severe protein calorie malnutrition Refused PEG tube. Improved oral intake -continue Jevity and regular diet -encouraged to take food by mouth  Pressured  speech Anxiety Psychiatry without recommendations. No evidence of psychosis per evaluation by psychiatry  Tinea pedis Improving -continue terbinafine cream bid  DVT prophylaxis: Heparin Code Status: Full code Family Communication: Mother at bedside Disposition Plan: Discharge tomorrow   Consultants:   Radiation oncology  Procedures:  XRT  Antimicrobials:   Vancomycin  Zosyn  Keflex  Doxycycline   Subjective: No pain overnight. Was up most of the day yesterday.  Objective: Vitals:   03/05/16 0449 03/05/16 1341 03/05/16 2247 03/06/16 0658  BP: 99/70 116/85 114/83 109/65  Pulse: 98 100 (!) 108 100  Resp: 18 20 20 20   Temp: 97.5 F (36.4 C) 98.9 F (37.2 C) 98.6 F (37 C) 98.1 F (36.7 C)  TempSrc: Oral Oral Oral Oral  SpO2: 100% 100% 100% 100%  Weight:      Height:        Intake/Output Summary (Last 24 hours) at 03/06/16 1407 Last data filed at 03/05/16 1750  Gross per 24 hour  Intake              480 ml  Output                0 ml  Net              480 ml   Filed Weights   02/25/16 1526  Weight: 70.3 kg (155 lb)    Examination:  General exam: Appears calm and comfortable. Laying in bed with head at foot of bed Respiratory system: Clear to auscultation. Respiratory effort normal. Cardiovascular system: S1 & S2 heard, RRR. No murmurs, rubs, gallops or clicks. Gastrointestinal system: Abdomen is nondistended, soft and nontender.  Normal bowel sounds heard. Central nervous system: Alert and oriented.  No focal neurological deficits. Extremities: No edema. No calf tenderness Skin: Neck mass dressing is clean and intact Psychiatry: Judgement and insight appear normal. Mood & affect anxious. Pleasant    Data Reviewed: I have personally reviewed following labs and imaging studies  CBC:  Recent Labs Lab 03/04/16 0502  WBC 20.6*  NEUTROABS 17.7*  HGB 8.6*  HCT 24.4*  MCV 76.7*  PLT [id number]   Basic Metabolic Panel:  Recent Labs Lab  03/02/16 0448 03/03/16 0448 03/04/16 0502 03/05/16 0423 03/06/16 0356  NA 134* 133* 131* 130* 132*  K 3.1* 3.6 2.9* 3.9 3.9  CL 103 103 98* 100* 101  CO2 22 19* 21* 20* 22  GLUCOSE 129* 115* 158* 117* 126*  BUN 6 5* 5* 13 15  CREATININE 0.96 0.94 0.96 1.09 0.86  CALCIUM 6.1* 6.0* 6.0* 6.6* 6.7*  MG 1.0* 1.3* 1.0* 1.6* 1.6*   GFR: Estimated Creatinine Clearance: 110.1 mL/min (by C-G formula based on SCr of 0.86 mg/dL). Liver Function Tests:  Recent Labs Lab 03/06/16 0356  ALBUMIN 2.3*   No results for input(s): LIPASE, AMYLASE in the last 168 hours. No results for input(s): AMMONIA in the last 168 hours. Coagulation Profile: No results for input(s): INR, PROTIME in the last 168 hours. Cardiac Enzymes: No results for input(s): CKTOTAL, CKMB, CKMBINDEX, TROPONINI in the last 168 hours. BNP (last 3 results) No results for input(s): PROBNP in the last 8760 hours. HbA1C: No results for input(s): HGBA1C in the last 72 hours. CBG: No results for input(s): GLUCAP in the last 168 hours. Lipid Profile: No results for input(s): CHOL, HDL, LDLCALC, TRIG, CHOLHDL, LDLDIRECT in the last 72 hours. Thyroid Function Tests: No results for input(s): TSH, T4TOTAL, FREET4, T3FREE, THYROIDAB in the last 72 hours. Anemia Panel: No results for input(s): VITAMINB12, FOLATE, FERRITIN, TIBC, IRON, RETICCTPCT in the last 72 hours. Sepsis Labs: No results for input(s): PROCALCITON, LATICACIDVEN in the last 168 hours.  Recent Results (from the past 240 hour(s))  Culture, blood (routine x 2)     Status: None   Collection Time: 02/27/16  2:36 AM  Result Value Ref Range Status   Specimen Description BLOOD LEFT HAND  Final   Special Requests BOTTLES DRAWN AEROBIC AND ANAEROBIC 5 CC  Final   Culture   Final    NO GROWTH 5 DAYS Performed at [facility], [address] [place], [place] [address]    Report Status 03/03/2016 FINAL  Final  Culture, blood (routine x 2)     Status: None    Collection Time: 02/27/16  2:36 AM  Result Value Ref Range Status   Specimen Description BLOOD RIGHT HAND  Final   Special Requests BOTTLES DRAWN AEROBIC ONLY 5 CC  Final   Culture   Final    NO GROWTH 5 DAYS Performed at [facility] [address] [place], [place] [address]    Report Status 03/03/2016 FINAL  Final  Culture, blood (routine x 2)     Status: None   Collection Time: 02/29/16  7:59 AM  Result Value Ref Range Status   Specimen Description BLOOD RIGHT HAND  Final   Special Requests BOTTLES DRAWN AEROBIC AND ANAEROBIC 5CC  Final   Culture   Final    NO GROWTH 5 DAYS Performed at [facility], [place] [address] [place],  [address]    Report Status 03/05/2016 FINAL  Final  Culture, blood (routine x 2)     Status: None  Collection Time: 02/29/16  8:05 AM  Result Value Ref Range Status   Specimen Description BLOOD RIGHT ARM  Final   Special Requests BOTTLES DRAWN AEROBIC AND ANAEROBIC [place]  Final   Culture   Final    NO GROWTH 5 DAYS Performed at [facility], [place] [address] [place], [place] [address]    Report Status 03/05/2016 FINAL  Final         Radiology Studies: No results found.      Scheduled Meds: . calcium carbonate  2 tablet Oral BID WC  . feeding supplement (ENSURE ENLIVE)  237 mL Oral BID BM  . heparin  5,000 Units Subcutaneous Q8H  . HYDROmorphone  2 mg Oral Once  . magnesium chloride  2 tablet Oral BID  . multivitamin with minerals  1 tablet Oral Daily  . naproxen  500 mg Oral BID WC  . polyethylene glycol  17 g Oral TID  . sodium chloride flush  3 mL Intravenous Q12H  . terbinafine   Topical BID   Continuous Infusions:   LOS: 10 days     Cordelia Poche [facility] 03/06/2016, 2:07 PM Pager: [phone]  If 7PM-7AM, please contact night-coverage [email or web address] Password [facility] 03/06/2016, 2:07 PM

## 2016-03-07 ENCOUNTER — Encounter: Payer: Self-pay | Admitting: *Deleted

## 2016-03-07 ENCOUNTER — Ambulatory Visit
Admit: 2016-03-07 | Discharge: 2016-03-07 | Disposition: A | Discharge status: Home / Self Care | Payer: Medicaid Other | Attending: Radiation Oncology | Admitting: Radiation Oncology

## 2016-03-07 ENCOUNTER — Ambulatory Visit
Admission: RE | Admit: 2016-03-07 | Discharge: 2016-03-07 | Disposition: A | Discharge status: Home / Self Care | Payer: Medicaid Other | Source: Ambulatory Visit | Attending: Radiation Oncology | Admitting: Radiation Oncology

## 2016-03-07 DIAGNOSIS — C4442 Squamous cell carcinoma of skin of scalp and neck: Secondary | ICD-10-CM

## 2016-03-07 DIAGNOSIS — K143 Hypertrophy of tongue papillae: Secondary | ICD-10-CM

## 2016-03-07 LAB — BASIC METABOLIC PANEL
ANION GAP: 8 (ref 5–15)
BUN: 9 mg/dL (ref 6–20)
CALCIUM: 7.5 mg/dL — AB (ref 8.9–10.3)
CO2: 25 mmol/L (ref 22–32)
Chloride: 102 mmol/L (ref 101–111)
Creatinine, Ser: 0.75 mg/dL (ref 0.61–1.24)
GFR calc Af Amer: >60 mL/min (ref >60)
GFR calc non Af Amer: >60 mL/min (ref >60)
GLUCOSE: 126 mg/dL — AB (ref 65–99)
Potassium: 4 mmol/L (ref 3.5–5.1)
Sodium: 135 mmol/L (ref 135–145)

## 2016-03-07 LAB — MAGNESIUM: Magnesium: 1.8 mg/dL (ref 1.7–2.4)

## 2016-03-07 MED ORDER — OXYCODONE HCL 5 MG/5ML PO SOLN: 5.0000 mg | ORAL | 0 refills | Status: DC | PRN

## 2016-03-07 MED ORDER — ENSURE ENLIVE PO LIQD
237.0000 mL | Freq: Two times a day (BID) | ORAL | 12 refills | Status: AC
Start: 2016-03-08 — End: ?

## 2016-03-07 MED ORDER — SENNA 8.6 MG PO TABS: 1.0000 | ORAL_TABLET | Freq: Every day | ORAL | 0 refills | Status: DC

## 2016-03-07 MED ORDER — POLYETHYLENE GLYCOL 3350 17 G PO PACK: 17.0000 g | PACK | Freq: Every day | ORAL | 0 refills | Status: DC

## 2016-03-07 MED ORDER — NAPROXEN 500 MG PO TABS: 500.0000 mg | ORAL_TABLET | Freq: Two times a day (BID) | ORAL | 0 refills | Status: DC

## 2016-03-07 MED ORDER — LORAZEPAM 0.5 MG PO TABS: 0.5000 mg | ORAL_TABLET | Freq: Four times a day (QID) | ORAL | 0 refills | Status: DC | PRN

## 2016-03-07 MED ORDER — MAGNESIUM CHLORIDE 64 MG PO TBEC: 2.0000 | DELAYED_RELEASE_TABLET | Freq: Two times a day (BID) | ORAL | 0 refills | Status: DC

## 2016-03-07 MED ORDER — TERBINAFINE HCL 1 % EX CREA: TOPICAL_CREAM | Freq: Two times a day (BID) | CUTANEOUS | 0 refills | Status: AC

## 2016-03-07 MED ORDER — MAGIC MOUTHWASH: 5.0000 mL | Freq: Three times a day (TID) | ORAL | 0 refills | Status: AC | PRN

## 2016-03-07 MED ORDER — MAGIC MOUTHWASH
5.0000 mL | Freq: Three times a day (TID) | ORAL | Status: DC | PRN
Start: 2016-03-07 — End: 2016-03-07
  Administered 2016-03-07: 5 mL via ORAL
  Filled 2016-03-07 (×2): qty 5

## 2016-03-07 NOTE — Progress Notes (Signed)
Patient ID: Johnny Mooney, male   DOB: 27-Jun-1970, 46 y.o.   MRN: [id number]         [facility]  Date of Admission:  02/25/2016     Principal Problem:   Fever Active Problems:   Tobacco abuse   Squamous cell carcinoma of neck   Hypercalcemia of malignancy   Hypercalcemia   AKI (acute kidney injury) ([place])   Malnutrition of moderate degree   Sepsis (HCC)   Tinea pedis   . calcium carbonate  2 tablet Oral BID WC  . enoxaparin (LOVENOX) injection  40 mg Subcutaneous Q24H  . feeding supplement (ENSURE ENLIVE)  237 mL Oral BID BM  . HYDROmorphone  2 mg Oral Once  . magnesium chloride  2 tablet Oral BID  . multivitamin with minerals  1 tablet Oral Daily  . naproxen  500 mg Oral BID WC  . polyethylene glycol  17 g Oral TID  . sodium chloride flush  3 mL Intravenous Q12H  . terbinafine   Topical BID    SUBJECTIVE: He is very concerned about a white coating on his tongue. He wants to know what he can do about it. He says that he is thinking about drinking some milk to help with that. He says that food does not taste normal to him. He is also very concerned about the dry skin on his feet and wants to know what he should do about this. He wants to know how often he should use moisturizing cream and how much to use.  Review of Systems: Review of Systems  Constitutional: Positive for malaise/fatigue and weight loss. Negative for chills, diaphoresis and fever.  HENT: Negative for sore throat.        Dry mouth and coated tongue as noted in history of present illness.  Respiratory: Negative for cough, sputum production and shortness of breath.   Cardiovascular: Negative for chest pain.  Gastrointestinal: Negative for abdominal pain, diarrhea, heartburn, nausea and vomiting.  Skin: Negative for rash.       Very dry skin.  Neurological: Negative for dizziness and headaches.    Past Medical History:  Diagnosis Date  . Cancer ([place]) 11/2015   sq cell  skin  . Cellulitis of left lower extremity    dx 09/ 2017 at ED took antibiotic per is healing  . History of traumatic head injury    closed -- no residual  . Subcutaneous mass of head    RIGHT POSTERIOR SCALP--  I&D at ED 09-16-2015 and 10-10-2015    Social History  Substance Use Topics  . Smoking status: Current Every Day Smoker    Packs/day: 0.33    Years: 5.00    Types: Cigars  . Smokeless tobacco: Never Used     Comment: quit cigarette smoking 2013, currently smokes 2 cigars daily  . Alcohol use 1.0 oz/week    2 Standard drinks or equivalent per week     Comment: couple of beers on the weekends    Family History  Problem Relation Age of Onset  . Diabetes Mellitus II Mother   . Asthma Father    No Known Allergies  OBJECTIVE: Vitals:   03/06/16 0658 03/06/16 1658 03/06/16 2228 03/07/16 0654  BP: 109/65 136/83 130/82 123/85  Pulse: 100 (!) 121 97 99  Resp: 20 (!) 22 20 20   Temp: 98.1 F (36.7 C) (!) 100.4 F (38 C) 98.4 F (36.9 C) 98.3 F (36.8 C)  TempSrc: Oral Oral  Oral Oral  SpO2: 100% 100% 100% 100%  Weight:      Height:       Body mass index is 21.62 kg/m.  Physical Exam  Constitutional: He is oriented to person, place, and time.  He is standing in the bathroom. He is restless and pacing. He frequently closes his eyes for long periods of time. He has very preoccupied by the very mild white coating on his tongue and dry skin on his feet. He has questions repeatedly and does not seem satisfied with my responses.  HENT:  Mouth/Throat: No oropharyngeal exudate.  Very mild white coated tongue.  Neck:  No change in the large, bulky posterior neck mass.  Neurological: He is alert and oriented to person, place, and time.  Skin:  IV site looks good.    Lab Results Lab Results  Component Value Date   WBC 20.6 (H) 03/04/2016   HGB 8.6 (L) 03/04/2016   HCT 24.4 (L) 03/04/2016   MCV 76.7 (L) 03/04/2016   PLT 342 03/04/2016    Lab Results  Component  Value Date   CREATININE 0.75 03/07/2016   BUN 9 03/07/2016   NA 135 03/07/2016   K 4.0 03/07/2016   CL 102 03/07/2016   CO2 25 03/07/2016    Lab Results  Component Value Date   ALT 38 02/25/2016   AST 61 (H) 02/25/2016   ALKPHOS 194 (H) 02/25/2016   BILITOT 0.43 02/25/2016     Microbiology: Recent Results (from the past 240 hour(s))  Culture, blood (routine x 2)     Status: None   Collection Time: 02/27/16  2:36 AM  Result Value Ref Range Status   Specimen Description BLOOD LEFT HAND  Final   Special Requests BOTTLES DRAWN AEROBIC AND ANAEROBIC 5 CC  Final   Culture   Final    NO GROWTH 5 DAYS Performed at [facility], [place] [address] [place], [place] [address]    Report Status 03/03/2016 FINAL  Final  Culture, blood (routine x 2)     Status: None   Collection Time: 02/27/16  2:36 AM  Result Value Ref Range Status   Specimen Description BLOOD RIGHT HAND  Final   Special Requests BOTTLES DRAWN AEROBIC ONLY 5 CC  Final   Culture   Final    NO GROWTH 5 DAYS Performed at [facility] [address], [place], [place] [address]    Report Status 03/03/2016 FINAL  Final  Culture, blood (routine x 2)     Status: None   Collection Time: 02/29/16  7:59 AM  Result Value Ref Range Status   Specimen Description BLOOD RIGHT HAND  Final   Special Requests BOTTLES DRAWN AEROBIC AND ANAEROBIC 5CC  Final   Culture   Final    NO GROWTH 5 DAYS Performed at [facility], [place] [address] [place], [place] [address]    Report Status 03/05/2016 FINAL  Final  Culture, blood (routine x 2)     Status: None   Collection Time: 02/29/16  8:05 AM  Result Value Ref Range Status   Specimen Description BLOOD RIGHT ARM  Final   Special Requests BOTTLES DRAWN AEROBIC AND ANAEROBIC [place]  Final   Culture   Final    NO GROWTH 5 DAYS Performed at [facility], [place] [address], [place], [place] [address]    Report Status 03/05/2016 FINAL  Final     ASSESSMENT: His fever  curve is  much improved since starting Naprosyn 3 days ago. I suspect that he has tumor fevers rather than any active infection. He seems to be tolerating Naprosyn well. I would continue it for the first tube future if he continues to tolerate it. I offered to have him try Magic mouthwash for his coated tongue.  PLAN: 1. Continue Naprosyn 2. Magic mouthwash 3. I will sign off now  Michel Bickers, MD [facility] [place] [phone] pager   [phone] cell 03/07/2016, 10:07 AM

## 2016-03-07 NOTE — Progress Notes (Signed)
Johnny Mooney presents for weekly undertreat after his radiation treatment today. Gayleen Orem joined me during this visit. Rick removed his dressing with my assistance, so [name] could observe the wound's progress while receiving radiation. Mr. Giannini voiced no concerns at this time. His mother was present during the visit. He tells me that he may be discharged tomorrow home. After [name] assessed the wound, Liliane Channel redressed the wound according to the orders in the chart with my assistance. He tolerated the procedure well, and was transported back to [address] by the radiation transporter.

## 2016-03-07 NOTE — Progress Notes (Signed)
Pt from home with spouse. Pt has Medicaid and Medicaid assigns pts with a PCP. Pt and mother informed to call number on back of Medicaid card to see which PCP he is assigned to. Pt also given information on other PCP's in the area who take Medicaid, including [facility]. Mother in room and states she will help pt get a follow up appointment with a PCP. No other CM needs communicated. Marney Doctor RN,BSN,NCM [phone]

## 2016-03-07 NOTE — Progress Notes (Signed)
03/07/16  1800  Reviewed discharge instructions with patient, pt girlfriend, and pt mom. Everyone verbalized understanding of discharge instructions. Copy of discharge instructions and prescriptions given to patient girlfriend.

## 2016-03-07 NOTE — Progress Notes (Signed)
Nutrition Follow-up  DOCUMENTATION CODES:   Severe malnutrition in context of acute illness/injury  INTERVENTION:   Continue Ensure Enlive po BID, each supplement provides 350 kcal and 20 grams of protein Multivitamin with minerals daily Encourage PO intake RD to continue to monitor  NUTRITION DIAGNOSIS:   Malnutrition related to acute illness, cancer and cancer related treatments as evidenced by energy intake < or equal to 50% for > or equal to 5 days, percent weight loss.  Ongoing.  GOAL:   Patient will meet greater than or equal to 90% of their needs  Progressing.  MONITOR:   PO intake, Supplement acceptance, Labs, Weight trends, Skin, I & O's  ASSESSMENT:   46 y.o. 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  Patient consuming 100% of meals x 4 days. Pt with new development of coated tongue, MD ordered Magic Mouthwash. Pt is inconsistently drinking ensure supplements.   Medications: OSCAL tablet BID, Slow MAG tablet BID, Multivitamin with minerals daily, Miralax packet TID, Magic Mouthwash TID PRN Labs reviewed: Mg WNL  Diet Order:  Diet regular Room service appropriate? Yes; Fluid consistency: Thin  Skin:  Wound (see comment) (Right Head Abscess)  Last BM:  2/11  Height:   Ht Readings from Last 1 Encounters:  02/25/16 5\' 11"  (1.803 m)    Weight:   Wt Readings from Last 1 Encounters:  02/25/16 155 lb (70.3 kg)    Ideal Body Weight:  78 kg  BMI:  Body mass index is 21.62 kg/m.  Estimated Nutritional Needs:   Kcal:  2100-2300 (30-33 kcal/kg)  Protein:  115-125 (1.6-1.8 g/kg)  Fluid:  >/= 2 L/d  EDUCATION NEEDS:   No education needs identified at this time  Clayton Bibles, MS, RD, LDN Pager: [phone] After Hours Pager:  [phone]

## 2016-03-07 NOTE — Discharge Summary (Signed)
Physician Discharge Summary  Johnny Mooney [id number] DOB: 1970-06-18 DOA: 02/25/2016  PCP: [facility]  Admit date: 02/25/2016 Discharge date: 03/07/2016  Admitted From: Home Disposition: Home  Recommendations for Outpatient Follow-up:  1. Follow up with PCP in 1 week 2. Please obtain BMP/CBC in one week 3. Please follow up on the following pending results:   Discharge Condition: Stable CODE STATUS: Full code Diet recommendation: Regular diet   Brief/Interim Summary:  Admission HPI written by Shon Millet, DO   Chief Complaint: decreased oral intake   HPI: Johnny Mooney is a 46 y.o. 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 History is mainly gathered from mother and ED notes. He has apparently had decreased food and liquid intake over the past 2-3 days. He has refused to come into the ED for assistance. He was seen at radiation appointment today and was urged to be evaluated in the ED . He currently complains of pain all over, esp in his abdomen due to constipation. He does not readily participate in conversation but is able to tell me that he feels chilled, no chest pain or shortness of breath, has had episode of productive cough yesterday, and admits to nausea, feels like he is getting a stomach ulcer, constipation, decreased urination, no peripheral edema.    ED Course: Patient was found to have Ca > 16. Started in IVF and given calcitonin. TRH asked for admission.     Hospital course:  Hypercalcemia of malignancy Patient was admitted with an undetectable calcium of >16 mg/dL. Hypercalcemia was treated with zoledronic acid x1, IV fluids and calcitonin. Calcium improved steadily, however,  patient developed hypocalcemia. Management of hypocalcemia below.  Hypocalcemia Secondary to above treatment. Calcitonin discontinued. Uncorrected calcium down to 6.0. No change with cessation of calcitonin, so calcium supplementation started, in addition to magnesium supplementation. Uncorrected calcium improved to 7.5, corrected to 8.9, on discharge. No calcium supplementation on discharge. Will need repeat labs and close follow-up to continue management.  Acute encephalopathy Resolved  Acute kidney injury Resolved   Constipation Secondary to opioid therapy. 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 Currently undergoing XRT and went during weekdays over course of admission. Tumor appears to be shrinking s/p XRT. Plan is for plastic surgery management after XRT.   Fevers Fungated mass initially thought to be infected. Started on broad spectrum antibioticsinfected and treated with vancomycin and zosyn initially. Fevers continued and multiple blood cultures were negative for bacterial growth. Antibiotics transitioned to Keflex, and then to Doxycycline. Infectious disease, Dr. Megan Salon, was consulted and attributed fevers to likely tumor fever since infectious workup had been negative to date, in addition to no clinical signs/symptoms of infection. Antibiotics were stopped and Naproxen started. Fever curve trended down and patient tolerated Naproxen therapy.   Hypomagnesemia Repleted with IV magnesium multiple times and started on oral magnesium. Discharged with prescription for about one week. Will need to have magnesium rechecked as an outpatient.  Hypokalemia Potassium repleted multiple times. Likely made worse to hypomagnesemia.   Severe protein calorie malnutrition Refused PEG tube. Improved oral intake.  Pressured speech Anxiety Psychiatry without recommendations. No evidence of psychosis per evaluation by psychiatry. Continued ativan as needed.  Tinea  pedis Started on terbinafine cream twice daily with good result.  Continue for up to two weeks and reevaluate.   Discharge Diagnoses:  Principal Problem:   Fever Active Problems:   Tobacco abuse   Squamous cell carcinoma of neck   Hypercalcemia of malignancy   Hypercalcemia   AKI (acute kidney injury) ([place])   Malnutrition of moderate degree   Sepsis ([place])   Tinea pedis    Discharge Instructions   Allergies as of 03/07/2016   No Known Allergies     Medication List    STOP taking these medications   ibuprofen 800 MG tablet Commonly known as:  ADVIL,MOTRIN   oxyCODONE 5 MG immediate release tablet Commonly known as:  Oxy IR/ROXICODONE Replaced by:  oxyCODONE 5 MG/5ML solution     TAKE these medications   feeding supplement (ENSURE ENLIVE) Liqd Take 237 mLs by mouth 2 (two) times daily between meals. Start taking on:  03/08/2016   LORazepam 0.5 MG tablet Commonly known as:  ATIVAN Take 1 tablet (0.5 mg total) by mouth every 6 (six) hours as needed for anxiety.   magic mouthwash Soln Take 5 mLs by mouth 3 (three) times daily as needed for mouth pain.   magnesium chloride 64 MG Tbec SR tablet Commonly known as:  SLOW-MAG Take 2 tablets (128 mg total) by mouth 2 (two) times daily.   naproxen 500 MG tablet Commonly known as:  NAPROSYN Take 1 tablet (500 mg total) by mouth 2 (two) times daily with a meal.   ondansetron 4 MG disintegrating tablet Commonly known as:  ZOFRAN-ODT Take 1 tablet (4 mg total) by mouth every 6 (six) hours as needed for nausea.   oxyCODONE 5 MG/5ML solution Commonly known as:  ROXICODONE Take 5 mLs (5 mg total) by mouth every 4 (four) hours as needed for severe pain. Replaces:  oxyCODONE 5 MG immediate release tablet   polyethylene glycol packet Commonly known as:  MIRALAX / GLYCOLAX Take 17 g by mouth daily.   senna 8.6 MG Tabs tablet Commonly known as:  SENOKOT Take 1 tablet (8.6 mg total) by mouth at bedtime.   terbinafine 1 %  cream Commonly known as:  LAMISIL Apply topically 2 (two) times daily. Use for up to 2 weeks.      Follow-up Information    Arnoldo Morale, MD. Go on 03/08/2016.   Specialty:  Family Medicine Why:  Hospital follow-up with lab work to check calcium and magnesium Contact information: [place] [id number] [phone]          No Known Allergies  Consultations:  Infectious disease   Procedures/Studies: Ct Head Wo Contrast  Result Date: 02/28/2016 CLINICAL DATA:  Metabolic encephalopathy. A subcutaneous mass of the head. EXAM: CT HEAD WITHOUT CONTRAST TECHNIQUE: Contiguous axial images were obtained from the base of the skull through the vertex without intravenous contrast. COMPARISON:  02/17/2016; 12/10/2015; PET-CT - 02/17/2016 FINDINGS: Brain: Gray-white differentiation is maintained. No CT evidence of acute large territory infarct. No intraparenchymal or extra-axial mass or hemorrhage. Unchanged size and configuration of the ventricles and the basilar cisterns. No midline shift. Vascular: No hyperdense vessel or unexpected calcification. Skull: No displaced calvarial fracture. Sinuses/Orbits: Limited visualization of the paranasal sinuses and mastoid air cells is normal. No air-fluid levels. Other: Grossly unchanged fungating subcutaneous mass about the right inferolateral scalp with dominant component measuring approximately 7.1 x 11.2 cm (image 2, series 2). Re- demonstrated associated skin ulceration. IMPRESSION: 1. No acute intracranial process. 2. Unchanged fungating mass involving the inferolateral aspect the right-side of the  scalp with dominant component measuring approximately 11.2 cm. Electronically Signed   By: Sandi Mariscal M.D.   On: 02/28/2016 14:21   Ct Head W Contrast  Result Date: 02/17/2016 CLINICAL DATA:  Metastatic squamous cell carcinoma. EXAM: CT HEAD WITH CONTRAST CT NECK WITH CONTRAST TECHNIQUE: Contiguous axial images were obtained from the  base of the skull through the vertex with intravenous contrast. Multidetector CT imaging of the and neck was performed using the standard protocol following the bolus administration of intravenous contrast. CONTRAST:  75 cc Isovue 300 intravenous COMPARISON:  12/10/2015 FINDINGS: CT HEAD FINDINGS Brain: No abnormal intracranial enhancement or swelling to suggest cerebral metastatic disease. Vascular: Major vessels are patent. Skull: Persistent broad area of enhancement in the posterior right scalp at site of squamous cell carcinoma resection. No bone erosion noted. There is a subgaleal avidly enhancing nodule along the left occipital bone measuring 11 mm, similar to prior and likely non hypermetabolic. Sinuses/Orbits: Negative CT NECK FINDINGS Pharynx and larynx: Normal. No mass or swelling. Salivary glands: There is a centrally necrotic mass in the right parotid tail, given constellation of findings consistent with adenopathy. The lesion measures 21 mm. No primary mass suspected. Thyroid: Negative Lymph nodes: Previously identified lymphadenopathy in the right suboccipital region and posterior triangle has enlarged markedly, with nodal conglomerate measuring up to 10 cm today. There is gross extracapsular spread with invasion of superficial and deep neck musculature. As above, there is new right parotid bed necrotic lymphadenopathy with extracapsular disease measuring 21 mm. Avidly enhancing lymph nodes extend throughout the right neck, but not reach the supraclavicular fossa. There are 2 avidly enhancing, lobulated lymph nodes in the left cervical chain along the posterior margin of the sternocleidomastoid, consistent with metastatic involvement. These are newly patent and measure up to 1 cm in long axis. Submental lymph nodes highlighted previously do not appear involved today. Vascular: Effaced appearance of the right internal jugular vein superiorly, without thrombosis. Major vessels are patent Mastoids and  visualized paranasal sinuses: Clear. Skeleton: No erosion at the right stylomastoid foramen. No osseous metastasis noted. Periapical erosion around the low left terminal molar, which has undergone endodontal repair. C3-4 non segmentation. Upper chest: No apical lung nodules.  Contemporaneous PET-CT. IMPRESSION: 1. 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 There has been remarkable progression of the suboccipital and cervical adenopathy on the right with bulky nodal mass measuring up to 10 cm and showing deep ulceration. New malignant adenopathy in the right parotid bed and left neck. 2. Stable appearance of scalp resection site. CT cannot differentiate between residual tumor and granulation tissue, reference contemporaneous PET-CT. No bone erosion or intracranial metastasis. Electronically Signed   By: Monte Fantasia M.D.   On: 02/17/2016 12:37   Ct Soft Tissue Neck W Contrast  Result Date: 02/17/2016 CLINICAL DATA:  Metastatic squamous cell carcinoma. EXAM: CT HEAD WITH CONTRAST CT NECK WITH CONTRAST TECHNIQUE: Contiguous axial images were obtained from the base of the skull through the vertex with intravenous contrast. Multidetector CT imaging of the and neck was performed using the standard protocol following the bolus administration of intravenous contrast. CONTRAST:  75 cc Isovue 300 intravenous COMPARISON:  12/10/2015 FINDINGS: CT HEAD FINDINGS Brain: No abnormal intracranial enhancement or swelling to suggest cerebral metastatic disease. Vascular: Major vessels are patent. Skull: Persistent broad area of enhancement in the posterior right scalp at site of squamous cell carcinoma resection. No bone erosion noted. There is a subgaleal avidly enhancing nodule along the left occipital bone  measuring 11 mm, similar to prior and likely non hypermetabolic. Sinuses/Orbits: Negative CT NECK FINDINGS Pharynx and larynx: Normal. No mass or swelling. Salivary glands: There is a centrally necrotic  mass in the right parotid tail, given constellation of findings consistent with adenopathy. The lesion measures 21 mm. No primary mass suspected. Thyroid: Negative Lymph nodes: Previously identified lymphadenopathy in the right suboccipital region and posterior triangle has enlarged markedly, with nodal conglomerate measuring up to 10 cm today. There is gross extracapsular spread with invasion of superficial and deep neck musculature. As above, there is new right parotid bed necrotic lymphadenopathy with extracapsular disease measuring 21 mm. Avidly enhancing lymph nodes extend throughout the right neck, but not reach the supraclavicular fossa. There are 2 avidly enhancing, lobulated lymph nodes in the left cervical chain along the posterior margin of the sternocleidomastoid, consistent with metastatic involvement. These are newly patent and measure up to 1 cm in long axis. Submental lymph nodes highlighted previously do not appear involved today. Vascular: Effaced appearance of the right internal jugular vein superiorly, without thrombosis. Major vessels are patent Mastoids and visualized paranasal sinuses: Clear. Skeleton: No erosion at the right stylomastoid foramen. No osseous metastasis noted. Periapical erosion around the low left terminal molar, which has undergone endodontal repair. C3-4 non segmentation. Upper chest: No apical lung nodules.  Contemporaneous PET-CT. IMPRESSION: 1. 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 There has been remarkable progression of the suboccipital and cervical adenopathy on the right with bulky nodal mass measuring up to 10 cm and showing deep ulceration. New malignant adenopathy in the right parotid bed and left neck. 2. Stable appearance of scalp resection site. CT cannot differentiate between residual tumor and granulation tissue, reference contemporaneous PET-CT. No bone erosion or intracranial metastasis. Electronically Signed   By: Monte Fantasia M.D.    On: 02/17/2016 12:37   Nm Pet Image Initial (pi) Skull Base To Thigh  Result Date: 02/17/2016 CLINICAL DATA:  Initial treatment strategy for squamous cell carcinoma of the right occipital scalp with metastatic disease posterior cervical lymph nodes. EXAM: NUCLEAR MEDICINE PET SKULL BASE TO THIGH TECHNIQUE: 11.3 mCi F-18 FDG was injected intravenously. Full-ring PET imaging was performed from the skull vertex to the thigh after the radiotracer. CT data was obtained and used for attenuation correction and anatomic localization. FASTING BLOOD GLUCOSE:  Value: 118 mg/dl COMPARISON:  CT scan from 12/10/2015 FINDINGS: HEAD AND NECK Occipital node/mass measuring 9.7 by 6.2 cm with maximum SUV 49.5, and associated cutaneous ulceration along with some central necrosis. Right infra-auricular lymph node observed, maximum SUV 38.3. Small superficial lymph nodes superficial to the sternocleidomastoid are observed on the right, measuring up to 0.8 cm on image 50/4 with maximum SUV 8.0. Lobularity of the dominant occipital nodal mass is probably due to confluent adenopathy. Symmetric tonsillar activity is probably physiologic. A contralateral (left-sided) level IIb lymph node measures 7 mm in short axis on image 58/4 and has a maximum standard uptake value of 4.6, much less than the contralateral nodes but enough to stand out against the background tissues. CHEST No hypermetabolic mediastinal or hilar nodes. No suspicious pulmonary nodules on the CT data. ABDOMEN/PELVIS No abnormal hypermetabolic activity within the liver, pancreas, adrenal glands, or spleen. No hypermetabolic lymph nodes in the abdomen or pelvis. SKELETON Diffuse hypermetabolic activity in the skeleton without CT correlate. IMPRESSION: 1. Occipital node/mass measuring up to 9.7 cm in long axis, maximum SUV 49.5, with cutaneous ulceration and central necrosis. Adjacent pathologic and enlarged infra  auricular lymph node and faintly enlarged superficial lymph  nodes on the right (superficial to the sternocleidomastoid). 2. Very faintly hypermetabolic contralateral (left-sided) level IIb lymph node measures 7 mm in short axis. 3. Diffuse hypermetabolic activity in the skeleton without CT correlate, query granulocyte stimulation. 4. No current scalp lesion or metastatic lesion to the chest, abdomen, or pelvis. Electronically Signed   By: Van Clines M.D.   On: 02/17/2016 12:27   Dg Chest Port 1 View  Result Date: 02/27/2016 CLINICAL DATA:  Fever. Smoker. Squamous cell carcinoma of the right occipital scalp. EXAM: PORTABLE CHEST 1 VIEW COMPARISON:  PET-CT 02/17/2016.  Radiographs 02/23/2009. FINDINGS: 0444 hours. The heart size and mediastinal contours are normal. The lungs are clear. There is no pleural effusion or pneumothorax. No acute osseous findings are identified. IMPRESSION: No active cardiopulmonary process. Electronically Signed   By: Richardean Sale M.D.   On: 02/27/2016 07:22       Subjective: Patient reports having pain overnight before getting analgesics.   Discharge Exam: Vitals:   03/07/16 0654 03/07/16 1445  BP: 123/85 120/78  Pulse: 99 (!) 124  Resp: 20 18  Temp: 98.3 F (36.8 C) 98.7 F (37.1 C)   Vitals:   03/06/16 1658 03/06/16 2228 03/07/16 0654 03/07/16 1445  BP: 136/83 130/82 123/85 120/78  Pulse: (!) 121 97 99 (!) 124  Resp: (!) 22 20 20 18   Temp: (!) 100.4 F (38 C) 98.4 F (36.9 C) 98.3 F (36.8 C) 98.7 F (37.1 C)  TempSrc: Oral Oral Oral Oral  SpO2: 100% 100% 100% 100%  Weight:      Height:        General exam: Appears calm and comfortable. Sitting on bed. Respiratory system: Clear to auscultation. Respiratory effort normal. Cardiovascular system: S1 & S2 heard, RRR. No murmurs, rubs, gallops or clicks. Gastrointestinal system: Abdomen is nondistended, soft and nontender.  Normal bowel sounds heard. Central nervous system: Alert and oriented. No focal neurological deficits. Extremities: No edema.  No calf tenderness Skin: Neck mass dressing is clean and intact Psychiatry: Judgement and insight appear normal. Mood & affect anxious.    Photo taken on 03/03/2016       The results of significant diagnostics from this hospitalization (including imaging, microbiology, ancillary and laboratory) are listed below for reference.     Microbiology: Recent Results (from the past 240 hour(s))  Culture, blood (routine x 2)     Status: None   Collection Time: 02/27/16  2:36 AM  Result Value Ref Range Status   Specimen Description BLOOD LEFT HAND  Final   Special Requests BOTTLES DRAWN AEROBIC AND ANAEROBIC 5 CC  Final   Culture   Final    NO GROWTH 5 DAYS Performed at [facility], [address], [place], [place] [address]    Report Status 03/03/2016 FINAL  Final  Culture, blood (routine x 2)     Status: None   Collection Time: 02/27/16  2:36 AM  Result Value Ref Range Status   Specimen Description BLOOD RIGHT HAND  Final   Special Requests BOTTLES DRAWN AEROBIC ONLY 5 CC  Final   Culture   Final    NO GROWTH 5 DAYS Performed at [facility] [address] [place], [place] [address]    Report Status 03/03/2016 FINAL  Final  Culture, blood (routine x 2)     Status: None   Collection Time: 02/29/16  7:59 AM  Result Value Ref Range Status  Specimen Description BLOOD RIGHT HAND  Final   Special Requests BOTTLES DRAWN AEROBIC AND ANAEROBIC 5CC  Final   Culture   Final    NO GROWTH 5 DAYS Performed at [facility], [place] [address], [place], [place] [address]    Report Status 03/05/2016 FINAL  Final  Culture, blood (routine x 2)     Status: None   Collection Time: 02/29/16  8:05 AM  Result Value Ref Range Status   Specimen Description BLOOD RIGHT ARM  Final   Special Requests BOTTLES DRAWN AEROBIC AND ANAEROBIC [place]  Final   Culture   Final    NO GROWTH 5 DAYS Performed at [facility], [place] [address] [place], [place] [address]    Report Status  03/05/2016 FINAL  Final     Labs: BNP (last 3 results) No results for input(s): BNP in the last 8760 hours. Basic Metabolic Panel:  Recent Labs Lab 03/03/16 0448 03/04/16 0502 03/05/16 0423 03/06/16 0356 03/07/16 0521  NA 133* 131* 130* 132* 135  K 3.6 2.9* 3.9 3.9 4.0  CL 103 98* 100* 101 102  CO2 19* 21* 20* 22 25  GLUCOSE 115* 158* 117* 126* 126*  BUN 5* 5* 13 15 9   CREATININE 0.94 0.96 1.09 0.86 0.75  CALCIUM 6.0* 6.0* 6.6* 6.7* 7.5*  MG 1.3* 1.0* 1.6* 1.6* 1.8   Liver Function Tests:  Recent Labs Lab 03/06/16 0356  ALBUMIN 2.3*   No results for input(s): LIPASE, AMYLASE in the last 168 hours. No results for input(s): AMMONIA in the last 168 hours. CBC:  Recent Labs Lab 03/04/16 0502  WBC 20.6*  NEUTROABS 17.7*  HGB 8.6*  HCT 24.4*  MCV 76.7*  PLT 342   Cardiac Enzymes: No results for input(s): CKTOTAL, CKMB, CKMBINDEX, TROPONINI in the last 168 hours. BNP: Invalid input(s): POCBNP CBG: No results for input(s): GLUCAP in the last 168 hours. D-Dimer No results for input(s): DDIMER in the last 72 hours. Hgb A1c No results for input(s): HGBA1C in the last 72 hours. Lipid Profile No results for input(s): CHOL, HDL, LDLCALC, TRIG, CHOLHDL, LDLDIRECT in the last 72 hours. Thyroid function studies No results for input(s): TSH, T4TOTAL, T3FREE, THYROIDAB in the last 72 hours.  Invalid input(s): FREET3 Anemia work up No results for input(s): VITAMINB12, FOLATE, FERRITIN, TIBC, IRON, RETICCTPCT in the last 72 hours. Urinalysis    Component Value Date/Time   COLORURINE STRAW (A) 02/25/2016 2340   APPEARANCEUR CLEAR 02/25/2016 2340   LABSPEC 1.008 02/25/2016 2340   PHURINE 7.0 02/25/2016 2340   GLUCOSEU NEGATIVE 02/25/2016 2340   HGBUR NEGATIVE 02/25/2016 2340   BILIRUBINUR NEGATIVE 02/25/2016 2340   KETONESUR NEGATIVE 02/25/2016 2340   PROTEINUR NEGATIVE 02/25/2016 2340   UROBILINOGEN 1.0 02/20/2012 1200   NITRITE NEGATIVE 02/25/2016 2340    LEUKOCYTESUR TRACE (A) 02/25/2016 2340   Sepsis Labs Invalid input(s): PROCALCITONIN,  WBC,  LACTICIDVEN Microbiology Recent Results (from the past 240 hour(s))  Culture, blood (routine x 2)     Status: None   Collection Time: 02/27/16  2:36 AM  Result Value Ref Range Status   Specimen Description BLOOD LEFT HAND  Final   Special Requests BOTTLES DRAWN AEROBIC AND ANAEROBIC 5 CC  Final   Culture   Final    NO GROWTH 5 DAYS Performed at [facility], [place] [address], [place], [place] [address]    Report Status 03/03/2016 FINAL  Final  Culture, blood (routine x 2)  Status: None   Collection Time: 02/27/16  2:36 AM  Result Value Ref Range Status   Specimen Description BLOOD RIGHT HAND  Final   Special Requests BOTTLES DRAWN AEROBIC ONLY 5 CC  Final   Culture   Final    NO GROWTH 5 DAYS Performed at [facility], [address], [place], [place] [address]    Report Status 03/03/2016 FINAL  Final  Culture, blood (routine x 2)     Status: None   Collection Time: 02/29/16  7:59 AM  Result Value Ref Range Status   Specimen Description BLOOD RIGHT HAND  Final   Special Requests BOTTLES DRAWN AEROBIC AND ANAEROBIC 5CC  Final   Culture   Final    NO GROWTH 5 DAYS Performed at [facility], [place] [address] [place], [place] [address]    Report Status 03/05/2016 FINAL  Final  Culture, blood (routine x 2)     Status: None   Collection Time: 02/29/16  8:05 AM  Result Value Ref Range Status   Specimen Description BLOOD RIGHT ARM  Final   Special Requests BOTTLES DRAWN AEROBIC AND ANAEROBIC [place]  Final   Culture   Final    NO GROWTH 5 DAYS Performed at [facility], [place] [address] [place], [place] [address]    Report Status 03/05/2016 FINAL  Final     Time coordinating discharge: Over 30 minutes  SIGNED:   Cordelia Poche, MD [facility] 03/07/2016, 5:23 PM Pager [phone]  If 7PM-7AM, please contact night-coverage [email or web address] Password  [email or web address]

## 2016-03-07 NOTE — Discharge Instructions (Addendum)
Johnny Mooney,  You were admitted because of your high calcium. You were treated for this, but then your calcium went too low. This was also treated, along with your magnesium and potassium. In addition, you had fevers. These are likely secondary to your tumor. Infectious disease saw you and did not think you had an infection. Please take your medication as prescribed and continue your radiation treatments. Also, please follow-up at the [place] [facility] as you need repeat lab work in [place] to TWO days to recheck your calcium. It was a pleasure taking care of you.  Sincerely,  Cordelia Poche, MD [facility]

## 2016-03-08 ENCOUNTER — Ambulatory Visit
Admission: RE | Admit: 2016-03-08 | Discharge: 2016-03-08 | Disposition: A | Discharge status: Home / Self Care | Payer: Medicaid Other | Source: Ambulatory Visit | Attending: Radiation Oncology | Admitting: Radiation Oncology

## 2016-03-08 ENCOUNTER — Ambulatory Visit: Payer: Medicaid Other | Admitting: Physical Therapy

## 2016-03-08 ENCOUNTER — Encounter: Payer: Self-pay | Admitting: Family Medicine

## 2016-03-08 ENCOUNTER — Encounter: Payer: Self-pay | Admitting: Nutrition

## 2016-03-08 ENCOUNTER — Ambulatory Visit: Payer: Medicaid Other | Attending: Family Medicine | Admitting: Family Medicine

## 2016-03-08 ENCOUNTER — Ambulatory Visit: Payer: Medicaid Other

## 2016-03-08 VITALS — BP 117/72 | HR 128 | Temp 99.5°F | Ht 70.0 in | Wt 158.2 lb

## 2016-03-08 DIAGNOSIS — Z510 Encounter for antineoplastic radiation therapy: Secondary | ICD-10-CM | POA: Diagnosis not present

## 2016-03-08 DIAGNOSIS — C760 Malignant neoplasm of head, face and neck: Secondary | ICD-10-CM | POA: Insufficient documentation

## 2016-03-08 DIAGNOSIS — C4442 Squamous cell carcinoma of skin of scalp and neck: Secondary | ICD-10-CM

## 2016-03-08 DIAGNOSIS — Z833 Family history of diabetes mellitus: Secondary | ICD-10-CM | POA: Diagnosis not present

## 2016-03-08 DIAGNOSIS — Z825 Family history of asthma and other chronic lower respiratory diseases: Secondary | ICD-10-CM | POA: Diagnosis not present

## 2016-03-08 DIAGNOSIS — Z79899 Other long term (current) drug therapy: Secondary | ICD-10-CM | POA: Insufficient documentation

## 2016-03-08 NOTE — Progress Notes (Signed)
Oncology Nurse Navigator Documentation  Met with Mr. Dipasquale during Weekly Under Treatment with [name], RN [facility].  His mother was present.  He arrived in bed. Removed dressing on scalp for Dr. Pearlie Oyster assessment, with RN Jennifer's assistance, redressed wound wet-to-dry. Mr. Mayorquin tolerated procedure well, completed WUT, was returned to 3W by transporter Scott.  Gayleen Orem, RN, BSN, [place] [facility] On[facility] Nurse [place] [facility] [place] [phone]

## 2016-03-08 NOTE — Progress Notes (Signed)
Subjective:  Patient ID: Johnny Mooney, male    DOB: [birth year]  Age: 46 y.o. 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  He was admitted with a calcium of greater than 16 placed on IV fluids, zoledronic acid and calcitonin with resulting hypocalcemia of 6.0 which required subsequent calcium and magnesium supplementation with improvement in uncorrected calcium to 7.5 at discharge. He was also treated for constipation during his hospitalization.  He presents today after receiving radiation treatment complains of fatigue and pain and right-sided face. He is not happy that he is unable to obtain Percocet from his oncologist which help his symptoms better as opposed to the oxycodone he is currently receiving.  Past Medical History:  Diagnosis Date  . Cancer ([place]) 11/2015   sq cell skin  . Cellulitis of left lower extremity    dx 09/ 2017 at ED took antibiotic per is healing  . History of traumatic head injury    closed -- no residual  . Subcutaneous mass of head    RIGHT POSTERIOR SCALP--  I&D at ED 09-16-2015 and 10-10-2015    Past Surgical History:  Procedure Laterality Date  . EXCISION MASS HEAD N/A 11/06/2015   Procedure: EXCISION MASS HEAD;  Surgeon: Mickeal Skinner, MD;  Location: [facility];  Service: General;  Laterality: N/A;  . LYMPH NODE BIOPSY N/A 01/07/2016   Procedure: EXCISIONAL LYMPH NODE BIOPSY;  Surgeon: [name], MD;  Location: [facility];  Service: General;  Laterality: N/A;  . MASS EXCISION N/A 01/07/2016   Procedure: EXCISION OF SCALP MASS;  Surgeon: [name], MD;  Location: [facility];  Service:  General;  Laterality: N/A;    No Known Allergies   Outpatient Medications Prior to Visit  Medication Sig Dispense Refill  . magic mouthwash SOLN Take 5 mLs by mouth 3 (three) times daily as needed for mouth pain. 120 mL 0  . magnesium chloride (SLOW-MAG) 64 MG TBEC SR tablet Take 2 tablets (128 mg total) by mouth 2 (two) times daily. 30 tablet 0  . naproxen (NAPROSYN) 500 MG tablet Take 1 tablet (500 mg total) by mouth 2 (two) times daily with a meal. 30 tablet 0  . ondansetron (ZOFRAN-ODT) 4 MG disintegrating tablet Take 1 tablet (4 mg total) by mouth every 6 (six) hours as needed for nausea. 20 tablet 0  . polyethylene glycol (MIRALAX / GLYCOLAX) packet Take 17 g by mouth daily. 14 each 0  . senna (SENOKOT) 8.6 MG TABS tablet Take 1 tablet (8.6 mg total) by mouth at bedtime. 30 each 0  . terbinafine (LAMISIL) 1 % cream Apply topically 2 (two) times daily. Use for up to 2 weeks. 30 g 0  . feeding supplement, ENSURE ENLIVE, (ENSURE ENLIVE) LIQD Take 237 mLs by mouth 2 (two) times daily between meals. (Patient not taking: Reported on 03/08/2016) 237 mL 12  . LORazepam (ATIVAN) 0.5 MG tablet Take 1 tablet (0.5 mg total) by mouth every 6 (six) hours as needed for anxiety. (Patient not taking: Reported on 03/08/2016) 10 tablet 0  . oxyCODONE (ROXICODONE) 5 MG/5ML solution Take 5 mLs (5 mg total) by mouth every 4 (four) hours as needed  for severe pain. (Patient not taking: Reported on 03/08/2016) 150 mL 0   No facility-administered medications prior to visit.     ROS Review of Systems  Constitutional: Positive for fatigue. Negative for activity change and appetite change.  HENT: Negative for sinus pressure and sore throat.   Eyes: Negative for visual disturbance.  Respiratory: Negative for cough, chest tightness and shortness of breath.   Cardiovascular: Negative for chest pain and leg swelling.  Gastrointestinal: Negative for abdominal distention, abdominal pain, constipation and diarrhea.    Endocrine: Negative.   Genitourinary: Negative for dysuria.  Musculoskeletal: Negative for joint swelling and myalgias.  Skin: Negative for rash.  Allergic/Immunologic: Negative.   Neurological: Positive for headaches. Negative for weakness, light-headedness and numbness.  Psychiatric/Behavioral: Negative for dysphoric mood and suicidal ideas.    Objective:  BP 117/72 (BP Location: Right Arm, Patient Position: Sitting, Cuff Size: Small)   Pulse (!) 128   Temp 99.5 F (37.5 C) (Oral)   Ht 5\' 10"  (1.778 m)   Wt 158 lb 3.2 oz (71.8 kg)   SpO2 99%   BMI 22.70 kg/m   BP/Weight 03/08/2016 [id number] [id number]  Systolic BP 123XX123 [id number] -  Diastolic BP 72 78 -  Wt. (Lbs) 158.2 - 155  BMI 22.7 - 21.62      Physical Exam  Constitutional: He is oriented to person, place, and time.  In some form of distress due to pain  Cardiovascular: Normal rate, normal heart sounds and intact distal pulses.   No murmur heard. Pulmonary/Chest: Effort normal and breath sounds normal. He has no wheezes. He has no rales. He exhibits no tenderness.  Abdominal: Soft. Bowel sounds are normal. He exhibits no distension and no mass. There is no tenderness.  Musculoskeletal: Normal range of motion.  Neurological: He is alert and oriented to person, place, and time.  Skin:  Secure dressing on right side of occiput  Psychiatric:  Irritable     Assessment & Plan:   1. Squamous cell carcinoma of neck Currently receiving radiation Pain management as per oncology Keep appointment with oncology  He is refusing medications for his mood at this time and is not interested in speaking with the LCSW as he states he has spoken with two therapists in the past. - COMPLETE METABOLIC PANEL WITH GFR; Future  2. Hypercalcemia of malignancy Last calcium was 7.5. We'll repeat in one week  3. Hypomagnesemia - Magnesium; Future   No orders of the defined types were placed in this encounter.   Follow-up: Return in  about 1 month (around 04/05/2016) for coordination of care.   Arnoldo Morale MD

## 2016-03-08 NOTE — Progress Notes (Signed)
   Weekly Management Note:  Inpatient   ICD-9-CM ICD-10-CM  Squamous cell carcinoma of neck 195.0 C44.42    Current Dose:14 Gy  Projected Dose: 70 Gy   Narrative:  The patient presents for routine under treatment assessment.  CBCT/MVCT images/Port film x-rays were reviewed.  The chart was checked. No acute side effects from RT thus far. He is still an inpatient for multiple issues including failure to thrive but possible discharge today. I spoke with Dr Teryl Lucy who will arrange supportive care visit with medical oncology as an outpatient, to review labwork, etc.  Patient's history is difficult to follow but he states a need to urinate. His mother states no new concerns.  Physical Findings:  Wt Readings from Last 3 Encounters:  02/25/16 155 lb (70.3 kg)  02/19/16 155 lb 3.2 oz (70.4 kg)  01/30/16 226 lb 3.2 oz (102.6 kg)   Vitals with BMI 03/07/2016  Height   Weight   BMI   Systolic [id number]  Diastolic 85  Pulse 99  Respirations 20    In gurney, bulky posterior right neck mass is slightly flattened compared to consultation.  Limited historian. Mother present.  CBC    Component Value Date/Time   WBC 20.6 (H) 03/04/2016 0502   RBC 3.18 (L) 03/04/2016 0502   HGB 8.6 (L) 03/04/2016 0502   HGB 11.7 (L) 02/25/2016 1339   HCT 24.4 (L) 03/04/2016 0502   HCT 35.4 (L) 02/25/2016 1339   PLT 342 03/04/2016 0502   PLT 414 (H) 02/25/2016 1339   MCV 76.7 (L) 03/04/2016 0502   MCV 80.8 02/25/2016 1339   MCH 27.0 03/04/2016 0502   MCHC 35.2 03/04/2016 0502   RDW 14.4 03/04/2016 0502   RDW 13.7 02/25/2016 1339   LYMPHSABS 1.1 03/04/2016 0502   LYMPHSABS 2.5 02/25/2016 1339   MONOABS 1.8 (H) 03/04/2016 0502   MONOABS 3.0 (H) 02/25/2016 1339   EOSABS 0.1 03/04/2016 0502   EOSABS 0.0 02/25/2016 1339   BASOSABS 0.0 03/04/2016 0502   BASOSABS 0.1 02/25/2016 1339     CMP     Component Value Date/Time   NA 135 03/07/2016 0521   NA 138 02/25/2016 1339   K 4.0 03/07/2016 0521   K 3.5  02/25/2016 1339   CL 102 03/07/2016 0521   CO2 25 03/07/2016 0521   CO2 30 (H) 02/25/2016 1339   GLUCOSE 126 (H) 03/07/2016 0521   GLUCOSE 118 02/25/2016 1339   BUN 9 03/07/2016 0521   BUN 72.3 (H) 02/25/2016 1339   CREATININE 0.75 03/07/2016 0521   CREATININE 2.2 (H) 02/25/2016 1339   CALCIUM 7.5 (L) 03/07/2016 0521   CALCIUM >16.0 (HH) 02/25/2016 1339   PROT 9.1 (H) 02/25/2016 1339   ALBUMIN 2.3 (L) 03/06/2016 0356   ALBUMIN 3.0 (L) 02/25/2016 1339   AST 61 (H) 02/25/2016 1339   ALT 38 02/25/2016 1339   ALKPHOS 194 (H) 02/25/2016 1339   BILITOT 0.43 02/25/2016 1339   GFRNONAA >60 03/07/2016 0521   GFRAA >60 03/07/2016 0521    Impression:  The patient is tolerating radiotherapy.   Plan:  Continue radiotherapy as planned.  Nursing will redress wound/tumor today. -----------------------------------  Eppie Gibson, MD

## 2016-03-09 ENCOUNTER — Ambulatory Visit
Admission: RE | Admit: 2016-03-09 | Discharge: 2016-03-09 | Disposition: A | Discharge status: Home / Self Care | Payer: Medicaid Other | Source: Ambulatory Visit | Attending: Radiation Oncology | Admitting: Radiation Oncology

## 2016-03-09 DIAGNOSIS — Z510 Encounter for antineoplastic radiation therapy: Secondary | ICD-10-CM | POA: Diagnosis not present

## 2016-03-10 ENCOUNTER — Ambulatory Visit: Payer: Medicaid Other

## 2016-03-10 ENCOUNTER — Encounter (HOSPITAL_COMMUNITY): Payer: Self-pay | Admitting: Emergency Medicine

## 2016-03-10 ENCOUNTER — Emergency Department (HOSPITAL_COMMUNITY): Payer: Medicaid Other

## 2016-03-10 ENCOUNTER — Observation Stay (HOSPITAL_COMMUNITY)
Admission: EM | Admit: 2016-03-10 | Discharge: 2016-03-11 | Disposition: A | Discharge status: Home / Self Care | Payer: Medicaid Other | Attending: Internal Medicine | Admitting: Internal Medicine

## 2016-03-10 DIAGNOSIS — Z720 Tobacco use: Secondary | ICD-10-CM | POA: Diagnosis present

## 2016-03-10 DIAGNOSIS — F17290 Nicotine dependence, other tobacco product, uncomplicated: Secondary | ICD-10-CM | POA: Insufficient documentation

## 2016-03-10 DIAGNOSIS — C4442 Squamous cell carcinoma of skin of scalp and neck: Secondary | ICD-10-CM | POA: Diagnosis not present

## 2016-03-10 DIAGNOSIS — E43 Unspecified severe protein-calorie malnutrition: Secondary | ICD-10-CM | POA: Insufficient documentation

## 2016-03-10 DIAGNOSIS — D649 Anemia, unspecified: Secondary | ICD-10-CM | POA: Diagnosis not present

## 2016-03-10 DIAGNOSIS — C4490 Unspecified malignant neoplasm of skin, unspecified: Secondary | ICD-10-CM | POA: Insufficient documentation

## 2016-03-10 DIAGNOSIS — R51 Headache: Secondary | ICD-10-CM | POA: Diagnosis not present

## 2016-03-10 DIAGNOSIS — R519 Headache, unspecified: Secondary | ICD-10-CM

## 2016-03-10 DIAGNOSIS — Z79899 Other long term (current) drug therapy: Secondary | ICD-10-CM | POA: Diagnosis not present

## 2016-03-10 LAB — CBC WITH DIFFERENTIAL/PLATELET
Basophils Absolute: 0 10*3/uL (ref 0.0–0.1)
Basophils Relative: 0 %
EOS ABS: 0.1 10*3/uL (ref 0.0–0.7)
EOS PCT: 1 %
HCT: 21.3 % — ABNORMAL LOW (ref 39.0–52.0)
Hemoglobin: 7.3 g/dL — ABNORMAL LOW (ref 13.0–17.0)
LYMPHS ABS: 0.9 10*3/uL (ref 0.7–4.0)
LYMPHS PCT: 5 %
MCH: 26.4 pg (ref 26.0–34.0)
MCHC: 34.3 g/dL (ref 30.0–36.0)
MCV: 76.9 fL — AB (ref 78.0–100.0)
MONO ABS: 1.9 10*3/uL — AB (ref 0.1–1.0)
Monocytes Relative: 9 %
Neutro Abs: 17.6 10*3/uL — ABNORMAL HIGH (ref 1.7–7.7)
Neutrophils Relative %: 85 %
PLATELETS: 545 10*3/uL — AB (ref 150–400)
RBC: 2.77 MIL/uL — AB (ref 4.22–5.81)
RDW: 14.6 % (ref 11.5–15.5)
WBC: 20.6 10*3/uL — AB (ref 4.0–10.5)

## 2016-03-10 LAB — COMPREHENSIVE METABOLIC PANEL
ALBUMIN: 2.1 g/dL — AB (ref 3.5–5.0)
ALT: 73 U/L — AB (ref 17–63)
AST: 48 U/L — AB (ref 15–41)
Alkaline Phosphatase: 249 U/L — ABNORMAL HIGH (ref 38–126)
Anion gap: 9 (ref 5–15)
BUN: 7 mg/dL (ref 6–20)
CHLORIDE: 105 mmol/L (ref 101–111)
CO2: 21 mmol/L — ABNORMAL LOW (ref 22–32)
CREATININE: 0.71 mg/dL (ref 0.61–1.24)
Calcium: 8.2 mg/dL — ABNORMAL LOW (ref 8.9–10.3)
GFR calc Af Amer: >60 mL/min (ref >60)
GLUCOSE: 128 mg/dL — AB (ref 65–99)
Potassium: 4.2 mmol/L (ref 3.5–5.1)
Sodium: 135 mmol/L (ref 135–145)
TOTAL PROTEIN: 7 g/dL (ref 6.5–8.1)
Total Bilirubin: 0.7 mg/dL (ref 0.3–1.2)

## 2016-03-10 LAB — ABO/RH: ABO/RH(D): O POS

## 2016-03-10 LAB — URINALYSIS, ROUTINE W REFLEX MICROSCOPIC
BILIRUBIN URINE: NEGATIVE
GLUCOSE, UA: NEGATIVE mg/dL
HGB URINE DIPSTICK: NEGATIVE
KETONES UR: NEGATIVE mg/dL
Leukocytes, UA: NEGATIVE
Nitrite: NEGATIVE
PROTEIN: NEGATIVE mg/dL
Specific Gravity, Urine: 1.005 (ref 1.005–1.030)
pH: 6 (ref 5.0–8.0)

## 2016-03-10 LAB — PREPARE RBC (CROSSMATCH)

## 2016-03-10 MED ORDER — ONDANSETRON 4 MG PO TBDP: 4.0000 mg | ORAL_TABLET | Freq: Four times a day (QID) | ORAL | Status: DC | PRN

## 2016-03-10 MED ORDER — METOCLOPRAMIDE HCL 5 MG/ML IJ SOLN
10.0000 mg | Freq: Once | INTRAMUSCULAR | Status: AC
  Administered 2016-03-10: 10 mg via INTRAVENOUS
  Filled 2016-03-10: qty 2

## 2016-03-10 MED ORDER — MAGIC MOUTHWASH
5.0000 mL | Freq: Three times a day (TID) | ORAL | Status: DC | PRN
  Filled 2016-03-10: qty 5

## 2016-03-10 MED ORDER — NAPROXEN 500 MG PO TABS
500.0000 mg | ORAL_TABLET | Freq: Two times a day (BID) | ORAL | Status: DC
Start: 2016-03-10 — End: 2016-03-11
  Administered 2016-03-11: 500 mg via ORAL
  Filled 2016-03-10 (×3): qty 1

## 2016-03-10 MED ORDER — SODIUM CHLORIDE 0.9 % IV SOLN: Freq: Once | INTRAVENOUS | Status: DC

## 2016-03-10 MED ORDER — KETOROLAC TROMETHAMINE 30 MG/ML IJ SOLN
30.0000 mg | Freq: Four times a day (QID) | INTRAMUSCULAR | Status: DC | PRN
  Administered 2016-03-11: 30 mg via INTRAVENOUS
  Filled 2016-03-10: qty 1

## 2016-03-10 MED ORDER — OXYCODONE HCL 5 MG/5ML PO SOLN
5.0000 mg | ORAL | Status: DC | PRN
  Administered 2016-03-10 – 2016-03-11 (×3): 5 mg via ORAL
  Filled 2016-03-10 (×3): qty 5

## 2016-03-10 MED ORDER — SODIUM CHLORIDE 0.9 % IV BOLUS (SEPSIS)
1000.0000 mL | Freq: Once | INTRAVENOUS | Status: AC
  Administered 2016-03-10: 1000 mL via INTRAVENOUS

## 2016-03-10 MED ORDER — SENNA 8.6 MG PO TABS: 1.0000 | ORAL_TABLET | Freq: Every day | ORAL | Status: DC

## 2016-03-10 MED ORDER — ACETAMINOPHEN 325 MG PO TABS
650.0000 mg | ORAL_TABLET | Freq: Four times a day (QID) | ORAL | Status: DC | PRN
  Administered 2016-03-10: 650 mg via ORAL
  Filled 2016-03-10: qty 2

## 2016-03-10 MED ORDER — DIPHENHYDRAMINE HCL 50 MG/ML IJ SOLN
25.0000 mg | Freq: Once | INTRAMUSCULAR | Status: AC
  Administered 2016-03-10: 25 mg via INTRAVENOUS
  Filled 2016-03-10: qty 1

## 2016-03-10 NOTE — Progress Notes (Signed)
Notified L Easterwood NP: patient with elevated temp 101.6 oral. Order for tylenol and continue with PRBC

## 2016-03-10 NOTE — ED Triage Notes (Signed)
Pt reports he has had a HA where he gets his radiation since last night. Last radiation yesterday for skin cancer on scalp. Pain worsened by bright lights and loud sounds. Not on chemo

## 2016-03-10 NOTE — ED Provider Notes (Signed)
[facility] DEPT Provider Note   CSN: [id number] Arrival date & time: 03/10/16  0910     History   Chief Complaint Chief Complaint  Patient presents with  . Headache    HPI Johnny Mooney is a 46 y.o. male.  The history is provided by the patient. No language interpreter was used.  Headache     Johnny Mooney is a 46 y.o. male who presents to the Emergency Department complaining of headache.  Level V caveat due to confusion.  Pt with hx/o skin cancer currently undergoing radiation treatments here for increased headache.  He reports headache worse sxs yesterday, photophobia, tingling in bilateral finger tips.  Past Medical History:  Diagnosis Date  . Cancer ([place]) 11/2015   sq cell skin  . Cellulitis of left lower extremity    dx 09/ 2017 at ED took antibiotic per is healing  . History of traumatic head injury    closed -- no residual  . Subcutaneous mass of head    RIGHT POSTERIOR SCALP--  I&D at ED 09-16-2015 and 10-10-2015    Patient Active Problem List   Diagnosis Date Noted  . Tinea pedis 03/03/2016  . Malnutrition of moderate degree 02/27/2016  . Sepsis ([place]) 02/27/2016  . Fever 02/27/2016  . Hypercalcemia of malignancy 02/25/2016  . Hypercalcemia 02/25/2016  . AKI (acute kidney injury) ([place]) 02/25/2016  . 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 Tobacco abuse 02/20/2012    Past Surgical History:  Procedure Laterality Date  . EXCISION MASS HEAD N/A 11/06/2015   Procedure: EXCISION MASS HEAD;  Surgeon: Mickeal Skinner, MD;  [facility];  Service: General;  Laterality: N/A;  . LYMPH NODE BIOPSY N/A 01/07/2016   Procedure: EXCISIONAL LYMPH NODE BIOPSY;  Surgeon: [name], MD;  Location: [facility];  Service: General;  Laterality: N/A;  . MASS EXCISION N/A 01/07/2016   Procedure: EXCISION OF SCALP MASS;  Surgeon: [name], MD;  Location: [facility];  Service: General;  Laterality: N/A;        Home Medications    Prior to Admission medications   Medication Sig Start Date End Date Taking? Authorizing Provider  magic mouthwash SOLN Take 5 mLs by mouth 3 (three) times daily as needed for mouth pain. 03/07/16  Yes Mariel Aloe, MD  magnesium chloride (SLOW-MAG) 64 MG TBEC SR tablet Take 2 tablets (128 mg total) by mouth 2 (two) times daily. 03/07/16  Yes Mariel Aloe, MD  naproxen (NAPROSYN) 500 MG tablet Take 1 tablet (500 mg total) by mouth 2 (two) times daily with a meal. 03/07/16  Yes Mariel Aloe, MD  ondansetron (ZOFRAN-ODT) 4 MG disintegrating tablet Take 1 tablet (4 mg total) by mouth every 6 (six) hours as needed for nausea. 02/02/16  Yes [name], MD  oxyCODONE (ROXICODONE) 5 MG/5ML solution Take 5 mLs (5 mg total) by mouth every 4 (four) hours as needed for severe pain. 03/07/16  Yes Mariel Aloe, MD  polyethylene glycol (MIRALAX / GLYCOLAX) packet Take 17 g by mouth daily. 03/07/16  Yes Mariel Aloe, MD  senna (SENOKOT) 8.6 MG TABS tablet Take 1 tablet (8.6 mg total) by mouth at bedtime. 03/07/16  Yes Mariel Aloe, MD  terbinafine (LAMISIL) 1 % cream Apply topically 2 (two) times daily. Use for up to 2 weeks. 03/07/16  Yes Mariel Aloe, MD  feeding supplement, ENSURE ENLIVE, (ENSURE ENLIVE) LIQD Take 237 mLs by mouth 2 (  two) times daily between meals. Patient not taking: Reported on 03/08/2016 03/08/16   Mariel Aloe, MD  LORazepam (ATIVAN) 0.5 MG tablet Take 1 tablet (0.5 mg total) by mouth every 6 (six) hours as needed for anxiety. Patient not taking: Reported on 03/08/2016 03/07/16   Mariel Aloe, MD    Family History Family History  Problem Relation Age of Onset  . Diabetes Mellitus II Mother   . Asthma Father     Social History Social History  Substance Use Topics  . Smoking status: Current Every Day Smoker    Packs/day: 0.33    Years: 5.00    Types: Cigars  . Smokeless tobacco: Never Used     Comment: quit cigarette smoking 2013,  currently smokes 2 cigars daily  . Alcohol use No     Allergies   Patient has no known allergies.   Review of Systems Review of Systems  Unable to perform ROS: Mental status change  Neurological: Positive for headaches.     Physical Exam Updated Vital Signs BP 100/62 (BP Location: Left Arm)   Pulse 100   Temp 98.5 F (36.9 C) (Oral)   Resp 16   Ht 5\' 10"  (1.778 m)   Wt 152 lb (68.9 kg)   SpO2 99%   BMI 21.81 kg/m   Physical Exam  Constitutional: He appears well-developed and well-nourished. He appears distressed.  HENT:  Head: Normocephalic.  Mouth/Throat: Oropharynx is clear and moist.  Mass to right posteriolateral neck  Eyes: Pupils are equal, round, and reactive to light.  Cardiovascular: Regular rhythm.   No murmur heard. tachycardic  Pulmonary/Chest: Effort normal and breath sounds normal. No respiratory distress.  Abdominal: Soft. There is no tenderness. There is no rebound and no guarding.  Musculoskeletal: He exhibits no edema or tenderness.  Neurological: He is alert.  Disoriented to time.  Low volume speech, mildly confused.  Generalized weakness.  Falls asleep during exam.    Skin: Skin is warm and dry.  Psychiatric:  Unable to assess  Nursing note and vitals reviewed.    ED Treatments / Results  Labs (all labs ordered are listed, but only abnormal results are displayed) Labs Reviewed  COMPREHENSIVE METABOLIC PANEL - Abnormal; Notable for the following:       Result Value   CO2 21 (*)    Glucose, Bld 128 (*)    Calcium 8.2 (*)    Albumin 2.1 (*)    AST 48 (*)    ALT 73 (*)    Alkaline Phosphatase 249 (*)    All other components within normal limits  CBC WITH DIFFERENTIAL/PLATELET - Abnormal; Notable for the following:    WBC 20.6 (*)    RBC 2.77 (*)    Hemoglobin 7.3 (*)    HCT 21.3 (*)    MCV 76.9 (*)    Platelets 545 (*)    Neutro Abs 17.6 (*)    Monocytes Absolute 1.9 (*)    All other components within normal limits   URINALYSIS, ROUTINE W REFLEX MICROSCOPIC  TYPE AND SCREEN  PREPARE RBC (CROSSMATCH)    EKG  EKG Interpretation  Date/Time:  Thursday March 10 2016 11:12:48 EST Ventricular Rate:  105 PR Interval:    QRS Duration: 82 QT Interval:  351 QTC Calculation: 464 R Axis:   70 Text Interpretation:  Sinus tachycardia Consider left ventricular hypertrophy ST elev, probable normal early repol pattern Confirmed by Hazle Coca [phone]) on 03/10/2016 4:44:43 PM  Radiology Ct Head Wo Contrast  Result Date: 03/10/2016 CLINICAL DATA:  Headaches, history of squamous cell skin cancer EXAM: CT HEAD WITHOUT CONTRAST TECHNIQUE: Contiguous axial images were obtained from the base of the skull through the vertex without intravenous contrast. COMPARISON:  02/28/2016 FINDINGS: Brain: No evidence of acute infarction, hemorrhage, hydrocephalus, extra-axial collection or mass lesion/mass effect. Vascular: No hyperdense vessel or unexpected calcification. Skull: Normal. Negative for fracture or focal lesion. Sinuses/Orbits: No acute finding. Other: Large soft tissue mass is again identified in the right posterior neck extending superiorly into the occipital scalp. The overall appearance is stable from the recent exam. IMPRESSION: No acute intracranial abnormality noted. Stable right-sided neck and scalp mass consistent with the patient's given clinical history. Electronically Signed   By: Inez Catalina M.D.   On: 03/10/2016 10:56    Procedures Procedures (including critical care time)  Medications Ordered in ED Medications  0.9 %  sodium chloride infusion (not administered)  metoCLOPramide (REGLAN) injection 10 mg (not administered)  diphenhydrAMINE (BENADRYL) injection 25 mg (not administered)  sodium chloride 0.9 % bolus 1,000 mL (0 mLs Intravenous Stopped 03/10/16 1120)     Initial Impression / Assessment and Plan / ED Course  I have reviewed the triage vital signs and the nursing notes.  Pertinent  labs & imaging results that were available during my care of the patient were reviewed by me and considered in my medical decision making (see chart for details).     Patient with history of squamous cell carcinoma here with headache and malaise. Patient drowsy on initial evaluation and confused. On repeat assessment after IV fluids he is more awake but still very difficult to get a history from. Additional history was later available from his girlfriend who reports he has been having severe headaches at night and sleeping all day. This is been ongoing during his radiation treatment for his cancer. He has poor oral appetite with fatigue and malaise. CBC with a stable leukocytosis and thrombocytosis but worsening anemia, concern for symptomatic anemia. Plan to admit to the hospitalist service for symptomatically anemia. Patient did develop recurrent headache during ED stay and he was treated with Reglan and Benadryl.  Final Clinical Impressions(s) / ED Diagnoses   Final diagnoses:  None    New Prescriptions New Prescriptions   No medications on file     Quintella Reichert, MD 03/10/16 [phone]

## 2016-03-10 NOTE — H&P (Signed)
History and Physical    Johnny Mooney [id number] DOB: 07-Apr-1970 DOA: 03/10/2016  PCP: Arnoldo Morale, MD  Patient coming from: Home  Chief Complaint: Headache  HPI: Johnny Mooney is a 46 y.o. 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 Denies fevers or chills. Denies abd pain. Denies syncope or near syncope of note, pt was recently discharged on 2/12 for symptomatic hypercalcemia, resolved. Patient was also worked up for fevers, ultimately with antibiotics discontinued per ID recs  ED Course: In the ED, CT head was found to be unremarkable. One unit of PRBC's ordered, not yet been given for hgb of 7.3 (was 8.6 at last check). Hospitalist service consulted for consideration for admission for PRBC transfusion.  Review of Systems:  Review of Systems  Constitutional: Negative for chills and weight loss.  HENT: Negative for ear discharge, ear pain and nosebleeds.   Eyes: Positive for photophobia. Negative for discharge.  Respiratory: Negative for sputum production, shortness of breath and wheezing.   Cardiovascular: Negative for chest pain, palpitations and leg swelling.  Gastrointestinal: Negative for abdominal pain, nausea and vomiting.  Genitourinary: Negative for flank pain, frequency and hematuria.  Musculoskeletal: Negative for back pain, falls and joint pain.  Neurological: Positive for headaches. Negative for sensory change, seizures and loss of consciousness.  Psychiatric/Behavioral: Negative for hallucinations and memory loss. The patient is not nervous/anxious.     Past Medical History:  Diagnosis Date  . Cancer ([place]) 11/2015   sq cell skin  . Cellulitis of left lower extremity    dx 09/ 2017 at ED took antibiotic per is healing  . History of traumatic head injury    closed -- no residual  .  Subcutaneous mass of head    RIGHT POSTERIOR SCALP--  I&D at ED 09-16-2015 and 10-10-2015    Past Surgical History:  Procedure Laterality Date  . EXCISION MASS HEAD N/A 11/06/2015   Procedure: EXCISION MASS HEAD;  Surgeon: Mickeal Skinner, MD;  Location: [facility];  Service: General;  Laterality: N/A;  . LYMPH NODE BIOPSY N/A 01/07/2016   Procedure: EXCISIONAL LYMPH NODE BIOPSY;  Surgeon: [name], MD;  Location: [facility];  Service: General;  Laterality: N/A;  . MASS EXCISION N/A 01/07/2016   Procedure: EXCISION OF SCALP MASS;  Surgeon: [name], MD;  Location: [facility];  Service: General;  Laterality: N/A;     reports that he has been smoking Cigars.  He has a 1.65 pack-year smoking history. He has never used smokeless tobacco. He reports that he does not drink alcohol or use drugs.  No Known Allergies  Family History  Problem Relation Age of Onset  . Diabetes Mellitus II Mother   . Asthma Father     Prior to Admission medications   Medication Sig Start Date End Date Taking? Authorizing Provider  magic mouthwash SOLN Take 5 mLs by mouth 3 (three) times daily as needed for mouth pain. 03/07/16  Yes Mariel Aloe, MD  magnesium chloride (SLOW-MAG) 64 MG TBEC SR tablet Take 2 tablets (128 mg total) by mouth 2 (two) times daily. 03/07/16  Yes Mariel Aloe, MD  naproxen (NAPROSYN) 500 MG tablet Take 1 tablet (500 mg total) by mouth 2 (two) times daily with a meal. 03/07/16  Yes Mariel Aloe, MD  ondansetron (ZOFRAN-ODT) 4 MG disintegrating tablet Take  1 tablet (4 mg total) by mouth every 6 (six) hours as needed for nausea. 02/02/16  Yes [name], MD  oxyCODONE (ROXICODONE) 5 MG/5ML solution Take 5 mLs (5 mg total) by mouth every 4 (four) hours as needed for severe pain. 03/07/16  Yes Mariel Aloe, MD  polyethylene glycol (MIRALAX / GLYCOLAX) packet Take 17 g by mouth daily. 03/07/16  Yes Mariel Aloe, MD  senna (SENOKOT) 8.6 MG TABS  tablet Take 1 tablet (8.6 mg total) by mouth at bedtime. 03/07/16  Yes Mariel Aloe, MD  terbinafine (LAMISIL) 1 % cream Apply topically 2 (two) times daily. Use for up to 2 weeks. 03/07/16  Yes Mariel Aloe, MD  feeding supplement, ENSURE ENLIVE, (ENSURE ENLIVE) LIQD Take 237 mLs by mouth 2 (two) times daily between meals. Patient not taking: Reported on 03/08/2016 03/08/16   Mariel Aloe, MD  LORazepam (ATIVAN) 0.5 MG tablet Take 1 tablet (0.5 mg total) by mouth every 6 (six) hours as needed for anxiety. Patient not taking: Reported on 03/08/2016 03/07/16   Mariel Aloe, MD    Physical Exam: Vitals:   03/10/16 1230 03/10/16 1300 03/10/16 1330 03/10/16 1415  BP: 113/70 107/75 114/67 100/62  Pulse: 104 107 107 100  Resp:    16  Temp:      TempSrc:      SpO2: 100% 100% 99% 99%  Weight:      Height:        Constitutional: NAD, calm, comfortable Vitals:   03/10/16 1230 03/10/16 1300 03/10/16 1330 03/10/16 1415  BP: 113/70 107/75 114/67 100/62  Pulse: 104 107 107 100  Resp:    16  Temp:      TempSrc:      SpO2: 100% 100% 99% 99%  Weight:      Height:       Eyes: PERRL, lids and conjunctivae normal ENMT: Mucous membranes are moist. Posterior pharynx clear of any exudate or lesions.Normal dentition.  Neck: normal, supple, no masses, no thyromegaly Respiratory: clear to auscultation bilaterally, no wheezing, no crackles. Normal respiratory effort. No accessory muscle use.  Cardiovascular: Regular rate and rhythm Abdomen: no tenderness, no masses palpated. No hepatosplenomegaly. Bowel sounds positive.  Musculoskeletal: no clubbing / cyanosis. No joint deformity upper and lower extremities. Good ROM, no contractures. Normal muscle tone.  Skin: no rashes, lesions, ulcers. No induration Neurologic: CN 2-12 grossly intact. Sensation intact, DTR normal. Strength 5/5 in all 4.  Psychiatric: Normal judgment and insight. Alert and oriented x 3. Normal mood.    Labs on Admission: I have  personally reviewed following labs and imaging studies  CBC:  Recent Labs Lab 03/04/16 0502 03/10/16 1008  WBC 20.6* 20.6*  NEUTROABS 17.7* 17.6*  HGB 8.6* 7.3*  HCT 24.4* 21.3*  MCV 76.7* 76.9*  PLT 342 [id number]*   Basic Metabolic Panel:  Recent Labs Lab 03/04/16 0502 03/05/16 0423 03/06/16 0356 03/07/16 0521 03/10/16 1008  NA 131* 130* 132* 135 135  K 2.9* 3.9 3.9 4.0 4.2  CL 98* 100* 101 102 105  CO2 21* 20* 22 25 21*  GLUCOSE 158* 117* 126* 126* 128*  BUN 5* 13 15 9 7   CREATININE 0.96 1.09 0.86 0.75 0.71  CALCIUM 6.0* 6.6* 6.7* 7.5* 8.2*  MG 1.0* 1.6* 1.6* 1.8  --    GFR: Estimated Creatinine Clearance: 116 mL/min (by C-G formula based on SCr of 0.71 mg/dL). Liver Function Tests:  Recent Labs Lab 03/06/16 0356 03/10/16 1008  AST  --  48*  ALT  --  73*  ALKPHOS  --  249*  BILITOT  --  0.7  PROT  --  7.0  ALBUMIN 2.3* 2.1*   No results for input(s): LIPASE, AMYLASE in the last 168 hours. No results for input(s): AMMONIA in the last 168 hours. Coagulation Profile: No results for input(s): INR, PROTIME in the last 168 hours. Cardiac Enzymes: No results for input(s): CKTOTAL, CKMB, CKMBINDEX, TROPONINI in the last 168 hours. BNP (last 3 results) No results for input(s): PROBNP in the last 8760 hours. HbA1C: No results for input(s): HGBA1C in the last 72 hours. CBG: No results for input(s): GLUCAP in the last 168 hours. Lipid Profile: No results for input(s): CHOL, HDL, LDLCALC, TRIG, CHOLHDL, LDLDIRECT in the last 72 hours. Thyroid Function Tests: No results for input(s): TSH, T4TOTAL, FREET4, T3FREE, THYROIDAB in the last 72 hours. Anemia Panel: No results for input(s): VITAMINB12, FOLATE, FERRITIN, TIBC, IRON, RETICCTPCT in the last 72 hours. Urine analysis:    Component Value Date/Time   COLORURINE YELLOW 03/10/2016 1106   APPEARANCEUR CLEAR 03/10/2016 1106   LABSPEC 1.005 03/10/2016 1106   PHURINE 6.0 03/10/2016 1106   GLUCOSEU NEGATIVE  03/10/2016 1106   HGBUR NEGATIVE 03/10/2016 1106   [place] 03/10/2016 1106   KETONESUR NEGATIVE 03/10/2016 1106   PROTEINUR NEGATIVE 03/10/2016 1106   UROBILINOGEN 1.0 02/20/2012 1200   NITRITE NEGATIVE 03/10/2016 1106   LEUKOCYTESUR NEGATIVE 03/10/2016 1106   Sepsis Labs: !!!!!!!!!!!!!!!!!!!!!!!!!!!!!!!!!!!!!!!!!!!! @LABRCNTIP (procalcitonin:4,lacticidven:4) )No results found for this or any previous visit (from the past 240 hour(s)).   Radiological Exams on Admission: Ct Head Wo Contrast  Result Date: 03/10/2016 CLINICAL DATA:  Headaches, history of squamous cell skin cancer EXAM: CT HEAD WITHOUT CONTRAST TECHNIQUE: Contiguous axial images were obtained from the base of the skull through the vertex without intravenous contrast. COMPARISON:  02/28/2016 FINDINGS: Brain: No evidence of acute infarction, hemorrhage, hydrocephalus, extra-axial collection or mass lesion/mass effect. Vascular: No hyperdense vessel or unexpected calcification. Skull: Normal. Negative for fracture or focal lesion. Sinuses/Orbits: No acute finding. Other: Large soft tissue mass is again identified in the right posterior neck extending superiorly into the occipital scalp. The overall appearance is stable from the recent exam. IMPRESSION: No acute intracranial abnormality noted. Stable right-sided neck and scalp mass consistent with the patient's given clinical history. Electronically Signed   By: Inez Catalina M.D.   On: 03/10/2016 10:56      Assessment/Plan Active Problems:   Tobacco abuse   Squamous cell carcinoma of neck   Chronic anemia   Headache   1. Headache 1. CT head reviewed. Unremarkable for acute process 2. Reports photophobia.  3. Will continue with toradol PRN headache 2. Squamous cell cancer of neck 1. Followed by radiation oncology ([name]) 2. Undergoing radiation tx 3. Previously seen by ENT 4. Stable at this time 3. Chronic anemia 1. hgb of 7.3 2. Milldy tachycardic,  however appears to be chronic tachycardia 3. One unit PRBC's ordered in ED 4. Repeat CBC in AM  DVT prophylaxis: SCD's  Code Status: Full Family Communication: Pt in room  Disposition Plan: Discharge home 2/16  Consults called:  Admission status: observation. Likely will take less than 1-2 midnight stay   Angelica Wix, Orpah Melter MD [facility] Pager [phone]  If 7PM-7AM, please contact night-coverage [email or web address] Password [facility]  03/10/2016, 5:36 PM

## 2016-03-11 ENCOUNTER — Ambulatory Visit
Admission: RE | Admit: 2016-03-11 | Discharge: 2016-03-11 | Disposition: A | Discharge status: Home / Self Care | Payer: Medicaid Other | Source: Ambulatory Visit | Attending: Radiation Oncology | Admitting: Radiation Oncology

## 2016-03-11 ENCOUNTER — Telehealth: Payer: Self-pay | Admitting: Oncology

## 2016-03-11 DIAGNOSIS — G4489 Other headache syndrome: Secondary | ICD-10-CM | POA: Diagnosis not present

## 2016-03-11 DIAGNOSIS — C4442 Squamous cell carcinoma of skin of scalp and neck: Secondary | ICD-10-CM | POA: Diagnosis not present

## 2016-03-11 DIAGNOSIS — Z510 Encounter for antineoplastic radiation therapy: Secondary | ICD-10-CM | POA: Diagnosis not present

## 2016-03-11 DIAGNOSIS — E43 Unspecified severe protein-calorie malnutrition: Secondary | ICD-10-CM | POA: Insufficient documentation

## 2016-03-11 DIAGNOSIS — D649 Anemia, unspecified: Secondary | ICD-10-CM | POA: Diagnosis not present

## 2016-03-11 LAB — CBC
HEMATOCRIT: 27.7 % — AB (ref 39.0–52.0)
HEMOGLOBIN: 9.6 g/dL — AB (ref 13.0–17.0)
MCH: 27.1 pg (ref 26.0–34.0)
MCHC: 34.7 g/dL (ref 30.0–36.0)
MCV: 78.2 fL (ref 78.0–100.0)
Platelets: 583 10*3/uL — ABNORMAL HIGH (ref 150–400)
RBC: 3.54 MIL/uL — ABNORMAL LOW (ref 4.22–5.81)
RDW: 15 % (ref 11.5–15.5)
WBC: 25.2 10*3/uL — ABNORMAL HIGH (ref 4.0–10.5)

## 2016-03-11 LAB — TYPE AND SCREEN
Blood Product Expiration Date: 201803082359
ISSUE DATE / TIME: 201802152128
Unit Type and Rh: 5100

## 2016-03-11 MED ORDER — PREMIER PROTEIN SHAKE
2.0000 [oz_av] | Freq: Two times a day (BID) | ORAL | Status: DC
  Administered 2016-03-11: 2 [oz_av] via ORAL
  Filled 2016-03-11: qty 325.31

## 2016-03-11 NOTE — Progress Notes (Signed)
03/11/16 [id number] Spoke with Santiago Glad ET:[id number]. Patient scheduled to go to radiation at 1:30pm today.

## 2016-03-11 NOTE — Progress Notes (Signed)
Patient request that  girlfriend completed wet to dry to mass area on neck.

## 2016-03-11 NOTE — Progress Notes (Signed)
Initial Nutrition Assessment  DOCUMENTATION CODES:   Severe malnutrition in context of chronic illness  INTERVENTION:   Premier Protein BID, each supplement provides 160kcal and 30g protein.   NUTRITION DIAGNOSIS:   Malnutrition related to cancer and cancer related treatments as evidenced by severe depletion of body fat, severe depletion of muscle mass, 15 percent weight loss in 2 months.  GOAL:   Patient will meet greater than or equal to 90% of their needs  MONITOR:   PO intake, Supplement acceptance, Skin, Labs, Weight trends  REASON FOR ASSESSMENT:   Malnutrition Screening Tool    ASSESSMENT:   46 y.o. 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 Pt reports good appetite pta and eating well currently. Pt has been eating foods brought from home. Pt reports eating [employer] and [employer]. Per chart, pt has lost 26lbs(15%) in 2 months. This is severe. Pt scheduled to have radiation at 1:30p today. RD discussed the importance of adequate protein intake with this pt.   Medications reviewed and include: naproxen, senokot, oxycodone   Labs reviewed: Ca 8.2(L) adj. 9.72 wnl, AlkPhos 249(H), Alb 2.1(L), AST 48(H), ALT 73(H) WBC- 25.2(H), Hgb 9.6(L), Hct 27.7(L)  Nutrition-Focused physical exam completed. Findings are severe fat depletion, severe muscle depletion, and no edema.   Diet Order:  Diet regular Room service appropriate? Yes; Fluid consistency: Thin  Skin:  Wound (see comment) (head incision from abcess on 2/2)  Last BM:  2/15  Height:   Ht Readings from Last 1 Encounters:  03/10/16 _0  (1.778 m)    Weight:   Wt Readings from Last 1 Encounters:  03/10/16 152 lb (68.9 kg)    Ideal Body Weight:  72.7 kg  BMI:  Body mass index is 21.81 kg/m.  Estimated Nutritional  Needs:   Kcal:  1900-2200kcal/day   Protein:  94-113g/day   Fluid:  >2L/day   EDUCATION NEEDS:   No education needs identified at this time  Koleen Distance, RD, LDN Pager #[phone]

## 2016-03-11 NOTE — Discharge Summary (Addendum)
Physician Discharge Summary  Johnny Mooney [id number] DOB: 1970/02/11 DOA: 03/10/2016  PCP: Arnoldo Morale, MD  Admit date: 03/10/2016 Discharge date: 03/11/2016  Admitted From: Home  Disposition:  Home   Recommendations for Outpatient Follow-up:  1. Follow up with PCP in 1 weeks 2. Patient had one unit PRBC transfused.   Home Health: No  Equipment/Devices: No   Discharge Condition:Stable CODE STATUS: Full  Diet recommendation:  Regular   Brief/Interim Summary: This is a 46 year old male who presented to the hospital with a chief complaint of headache. Recently discharged on 2/12 for symptomatic hypercalcemia. His headache was on the right side, radited to his neck, without associated symptoms. Blood pressure was 113/70, heart rate 104, oxygen saturation 100%, respiratory rate 16. He was awake and alert, lungs were clear to auscultation, heart S1-S2 present rhythmic, abdomen was soft, lower extremity with no edema. Sodium 135, potassium 4.2, chloride 105, bicarbonate 21, glucose 120 agalactiae and 7, creatinine 0.71, white count 20.6, he will incentive 0.3, hematocrit 21.3, platelets 545. Urinalysis negative for infection. CT with a large soft tissue mass on the right posterior neck extending superiorly into the occipital scalp. EKG was sinus tachycardia with repolarization changes.   The patient was admitted to the hospital with working diagnosis of symptomatic anemia.  1. Symptomatic anemia. No signs of bleeding, anemia suspected to be related to malignancy. Patient received 1 unit of packed red blood cells with improvement in hemoglobin up to 9,6 and significant improvement of his symptoms. Will recommend outpatient follow-up with iron panel.  2. 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 Will continue outpatient radiation therapy. Pain seems to be well-controlled. Continue oxycodone for pain control.   3. Leukocytosis. Patient had a full ID workup on last admission, no  source of infection. At this point it is presumed to be related to malignancy. Close follow-up as an outpatient.   4. Severe calorie protein malnutrition. Patient was seen by nutrition, continue protein supplements.  Discharge Diagnoses:  Active Problems:   Tobacco abuse   Squamous cell carcinoma of neck   Chronic anemia   Headache   Protein-calorie malnutrition, severe    Discharge Instructions   Allergies as of 03/11/2016   No Known Allergies     Medication List    STOP taking these medications   LORazepam 0.5 MG tablet Commonly known as:  ATIVAN     TAKE these medications   feeding supplement (ENSURE ENLIVE) Liqd Take 237 mLs by mouth 2 (two) times daily between meals.   magic mouthwash Soln Take 5 mLs by mouth 3 (three) times daily as needed for mouth pain.   magnesium chloride 64 MG Tbec SR tablet Commonly known as:  SLOW-MAG Take 2 tablets (128 mg total) by mouth 2 (two) times daily.   naproxen 500 MG tablet Commonly known as:  NAPROSYN Take 1 tablet (500 mg total) by mouth 2 (two) times daily with a meal.   ondansetron 4 MG disintegrating tablet Commonly known as:  ZOFRAN-ODT Take 1 tablet (4 mg total) by mouth every 6 (six) hours as needed for nausea.   oxyCODONE 5 MG/5ML solution Commonly known as:  ROXICODONE Take 5 mLs (5 mg total) by mouth every 4 (four) hours as needed for severe pain.   polyethylene glycol packet Commonly known as:  MIRALAX / GLYCOLAX Take 17 g by mouth daily.   senna 8.6 MG Tabs tablet Commonly known as:  SENOKOT Take 1 tablet (8.6 mg total) by mouth at bedtime.  terbinafine 1 % cream Commonly known as:  LAMISIL Apply topically 2 (two) times daily. Use for up to 2 weeks.       No Known Allergies  Consultations:  Procedures/Studies: Ct Head Wo Contrast  Result Date: 03/10/2016 CLINICAL DATA:  Headaches, history of squamous cell skin cancer EXAM: CT HEAD WITHOUT CONTRAST TECHNIQUE: Contiguous axial images were  obtained from the base of the skull through the vertex without intravenous contrast. COMPARISON:  02/28/2016 FINDINGS: Brain: No evidence of acute infarction, hemorrhage, hydrocephalus, extra-axial collection or mass lesion/mass effect. Vascular: No hyperdense vessel or unexpected calcification. Skull: Normal. Negative for fracture or focal lesion. Sinuses/Orbits: No acute finding. Other: Large soft tissue mass is again identified in the right posterior neck extending superiorly into the occipital scalp. The overall appearance is stable from the recent exam. IMPRESSION: No acute intracranial abnormality noted. Stable right-sided neck and scalp mass consistent with the patient's given clinical history. Electronically Signed   By: Inez Catalina M.D.   On: 03/10/2016 10:56   Ct Head Wo Contrast  Result Date: 02/28/2016 CLINICAL DATA:  Metabolic encephalopathy. A subcutaneous mass of the head. EXAM: CT HEAD WITHOUT CONTRAST TECHNIQUE: Contiguous axial images were obtained from the base of the skull through the vertex without intravenous contrast. COMPARISON:  02/17/2016; 12/10/2015; PET-CT - 02/17/2016 FINDINGS: Brain: Gray-white differentiation is maintained. No CT evidence of acute large territory infarct. No intraparenchymal or extra-axial mass or hemorrhage. Unchanged size and configuration of the ventricles and the basilar cisterns. No midline shift. Vascular: No hyperdense vessel or unexpected calcification. Skull: No displaced calvarial fracture. Sinuses/Orbits: Limited visualization of the paranasal sinuses and mastoid air cells is normal. No air-fluid levels. Other: Grossly unchanged fungating subcutaneous mass about the right inferolateral scalp with dominant component measuring approximately 7.1 x 11.2 cm (image 2, series 2). Re- demonstrated associated skin ulceration. IMPRESSION: 1. No acute intracranial process. 2. Unchanged fungating mass involving the inferolateral aspect the right-side of the scalp  with dominant component measuring approximately 11.2 cm. Electronically Signed   By: Sandi Mariscal M.D.   On: 02/28/2016 14:21   Ct Head W Contrast  Result Date: 02/17/2016 CLINICAL DATA:  Metastatic squamous cell carcinoma. EXAM: CT HEAD WITH CONTRAST CT NECK WITH CONTRAST TECHNIQUE: Contiguous axial images were obtained from the base of the skull through the vertex with intravenous contrast. Multidetector CT imaging of the and neck was performed using the standard protocol following the bolus administration of intravenous contrast. CONTRAST:  75 cc Isovue 300 intravenous COMPARISON:  12/10/2015 FINDINGS: CT HEAD FINDINGS Brain: No abnormal intracranial enhancement or swelling to suggest cerebral metastatic disease. Vascular: Major vessels are patent. Skull: Persistent broad area of enhancement in the posterior right scalp at site of squamous cell carcinoma resection. No bone erosion noted. There is a subgaleal avidly enhancing nodule along the left occipital bone measuring 11 mm, similar to prior and likely non hypermetabolic. Sinuses/Orbits: Negative CT NECK FINDINGS Pharynx and larynx: Normal. No mass or swelling. Salivary glands: There is a centrally necrotic mass in the right parotid tail, given constellation of findings consistent with adenopathy. The lesion measures 21 mm. No primary mass suspected. Thyroid: Negative Lymph nodes: Previously identified lymphadenopathy in the right suboccipital region and posterior triangle has enlarged markedly, with nodal conglomerate measuring up to 10 cm today. There is gross extracapsular spread with invasion of superficial and deep neck musculature. As above, there is new right parotid bed necrotic lymphadenopathy with extracapsular disease measuring 21 mm. Avidly enhancing lymph nodes extend  throughout the right neck, but not reach the supraclavicular fossa. There are 2 avidly enhancing, lobulated lymph nodes in the left cervical chain along the posterior margin of  the sternocleidomastoid, consistent with metastatic involvement. These are newly patent and measure up to 1 cm in long axis. Submental lymph nodes highlighted previously do not appear involved today. Vascular: Effaced appearance of the right internal jugular vein superiorly, without thrombosis. Major vessels are patent Mastoids and visualized paranasal sinuses: Clear. Skeleton: No erosion at the right stylomastoid foramen. No osseous metastasis noted. Periapical erosion around the low left terminal molar, which has undergone endodontal repair. C3-4 non segmentation. Upper chest: No apical lung nodules.  Contemporaneous PET-CT. IMPRESSION: 1. 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 There has been remarkable progression of the suboccipital and cervical adenopathy on the right with bulky nodal mass measuring up to 10 cm and showing deep ulceration. New malignant adenopathy in the right parotid bed and left neck. 2. Stable appearance of scalp resection site. CT cannot differentiate between residual tumor and granulation tissue, reference contemporaneous PET-CT. No bone erosion or intracranial metastasis. Electronically Signed   By: Monte Fantasia M.D.   On: 02/17/2016 12:37   Ct Soft Tissue Neck W Contrast  Result Date: 02/17/2016 CLINICAL DATA:  Metastatic squamous cell carcinoma. EXAM: CT HEAD WITH CONTRAST CT NECK WITH CONTRAST TECHNIQUE: Contiguous axial images were obtained from the base of the skull through the vertex with intravenous contrast. Multidetector CT imaging of the and neck was performed using the standard protocol following the bolus administration of intravenous contrast. CONTRAST:  75 cc Isovue 300 intravenous COMPARISON:  12/10/2015 FINDINGS: CT HEAD FINDINGS Brain: No abnormal intracranial enhancement or swelling to suggest cerebral metastatic disease. Vascular: Major vessels are patent. Skull: Persistent broad area of enhancement in the posterior right scalp at site of  squamous cell carcinoma resection. No bone erosion noted. There is a subgaleal avidly enhancing nodule along the left occipital bone measuring 11 mm, similar to prior and likely non hypermetabolic. Sinuses/Orbits: Negative CT NECK FINDINGS Pharynx and larynx: Normal. No mass or swelling. Salivary glands: There is a centrally necrotic mass in the right parotid tail, given constellation of findings consistent with adenopathy. The lesion measures 21 mm. No primary mass suspected. Thyroid: Negative Lymph nodes: Previously identified lymphadenopathy in the right suboccipital region and posterior triangle has enlarged markedly, with nodal conglomerate measuring up to 10 cm today. There is gross extracapsular spread with invasion of superficial and deep neck musculature. As above, there is new right parotid bed necrotic lymphadenopathy with extracapsular disease measuring 21 mm. Avidly enhancing lymph nodes extend throughout the right neck, but not reach the supraclavicular fossa. There are 2 avidly enhancing, lobulated lymph nodes in the left cervical chain along the posterior margin of the sternocleidomastoid, consistent with metastatic involvement. These are newly patent and measure up to 1 cm in long axis. Submental lymph nodes highlighted previously do not appear involved today. Vascular: Effaced appearance of the right internal jugular vein superiorly, without thrombosis. Major vessels are patent Mastoids and visualized paranasal sinuses: Clear. Skeleton: No erosion at the right stylomastoid foramen. No osseous metastasis noted. Periapical erosion around the low left terminal molar, which has undergone endodontal repair. C3-4 non segmentation. Upper chest: No apical lung nodules.  Contemporaneous PET-CT. IMPRESSION: 1. 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 There has been remarkable progression of the suboccipital and cervical adenopathy on the right with bulky nodal mass measuring up to 10 cm and  showing deep ulceration. New malignant adenopathy in the right parotid bed and left neck. 2. Stable appearance of scalp resection site. CT cannot differentiate between residual tumor and granulation tissue, reference contemporaneous PET-CT. No bone erosion or intracranial metastasis. Electronically Signed   By: Monte Fantasia M.D.   On: 02/17/2016 12:37   Nm Pet Image Initial (pi) Skull Base To Thigh  Result Date: 02/17/2016 CLINICAL DATA:  Initial treatment strategy for squamous cell carcinoma of the right occipital scalp with metastatic disease posterior cervical lymph nodes. EXAM: NUCLEAR MEDICINE PET SKULL BASE TO THIGH TECHNIQUE: 11.3 mCi F-18 FDG was injected intravenously. Full-ring PET imaging was performed from the skull vertex to the thigh after the radiotracer. CT data was obtained and used for attenuation correction and anatomic localization. FASTING BLOOD GLUCOSE:  Value: 118 mg/dl COMPARISON:  CT scan from 12/10/2015 FINDINGS: HEAD AND NECK Occipital node/mass measuring 9.7 by 6.2 cm with maximum SUV 49.5, and associated cutaneous ulceration along with some central necrosis. Right infra-auricular lymph node observed, maximum SUV 38.3. Small superficial lymph nodes superficial to the sternocleidomastoid are observed on the right, measuring up to 0.8 cm on image 50/4 with maximum SUV 8.0. Lobularity of the dominant occipital nodal mass is probably due to confluent adenopathy. Symmetric tonsillar activity is probably physiologic. A contralateral (left-sided) level IIb lymph node measures 7 mm in short axis on image 58/4 and has a maximum standard uptake value of 4.6, much less than the contralateral nodes but enough to stand out against the background tissues. CHEST No hypermetabolic mediastinal or hilar nodes. No suspicious pulmonary nodules on the CT data. ABDOMEN/PELVIS No abnormal hypermetabolic activity within the liver, pancreas, adrenal glands, or spleen. No hypermetabolic lymph nodes in the  abdomen or pelvis. SKELETON Diffuse hypermetabolic activity in the skeleton without CT correlate. IMPRESSION: 1. Occipital node/mass measuring up to 9.7 cm in long axis, maximum SUV 49.5, with cutaneous ulceration and central necrosis. Adjacent pathologic and enlarged infra auricular lymph node and faintly enlarged superficial lymph nodes on the right (superficial to the sternocleidomastoid). 2. Very faintly hypermetabolic contralateral (left-sided) level IIb lymph node measures 7 mm in short axis. 3. Diffuse hypermetabolic activity in the skeleton without CT correlate, query granulocyte stimulation. 4. No current scalp lesion or metastatic lesion to the chest, abdomen, or pelvis. Electronically Signed   By: Van Clines M.D.   On: 02/17/2016 12:27   Dg Chest Port 1 View  Result Date: 02/27/2016 CLINICAL DATA:  Fever. Smoker. Squamous cell carcinoma of the right occipital scalp. EXAM: PORTABLE CHEST 1 VIEW COMPARISON:  PET-CT 02/17/2016.  Radiographs 02/23/2009. FINDINGS: 0444 hours. The heart size and mediastinal contours are normal. The lungs are clear. There is no pleural effusion or pneumothorax. No acute osseous findings are identified. IMPRESSION: No active cardiopulmonary process. Electronically Signed   By: Richardean Sale M.D.   On: 02/27/2016 07:22    (Echo, Carotid, EGD, Colonoscopy, ERCP)    Subjective: Feeling better, no dyspnea or headache, no nausea or vomiting.   Discharge Exam: Vitals:   03/11/16 0536 03/11/16 1304  BP: 138/85 103/70  Pulse: (!) 113 (!) 117  Resp: 16 16  Temp: 99.5 F (37.5 C)    Vitals:   03/10/16 2208 03/11/16 0018 03/11/16 0536 03/11/16 1304  BP: 96/70 132/78 138/85 103/70  Pulse: (!) 135 (!) 124 (!) 113 (!) 117  Resp: 17 18 16 16   Temp: (!) 101.7 F (38.7 C) 99.7 F (37.6 C) 99.5 F (37.5 C)   TempSrc: Oral  Oral Oral Oral  SpO2: 100% 100% 100% 100%  Weight:      Height:        General: Pt is alert, awake, not in acute  distress Cardiovascular: RRR, S1/S2 +, no rubs, no gallops Respiratory: CTA bilaterally, no wheezing, no rhonchi Abdominal: Soft, NT, ND, bowel sounds + Extremities: no edema, no cyanosis    The results of significant diagnostics from this hospitalization (including imaging, microbiology, ancillary and laboratory) are listed below for reference.     Microbiology: No results found for this or any previous visit (from the past 240 hour(s)).   Labs: BNP (last 3 results) No results for input(s): BNP in the last 8760 hours. Basic Metabolic Panel:  Recent Labs Lab 03/05/16 0423 03/06/16 0356 03/07/16 0521 03/10/16 1008  NA 130* 132* 135 135  K 3.9 3.9 4.0 4.2  CL 100* 101 102 105  CO2 20* 22 25 21*  GLUCOSE 117* 126* 126* 128*  BUN 13 15 9 7   CREATININE 1.09 0.86 0.75 0.71  CALCIUM 6.6* 6.7* 7.5* 8.2*  MG 1.6* 1.6* 1.8  --    Liver Function Tests:  Recent Labs Lab 03/06/16 0356 03/10/16 1008  AST  --  48*  ALT  --  73*  ALKPHOS  --  249*  BILITOT  --  0.7  PROT  --  7.0  ALBUMIN 2.3* 2.1*   No results for input(s): LIPASE, AMYLASE in the last 168 hours. No results for input(s): AMMONIA in the last 168 hours. CBC:  Recent Labs Lab 03/10/16 1008 03/11/16 0654  WBC 20.6* 25.2*  NEUTROABS 17.6*  --   HGB 7.3* 9.6*  HCT 21.3* 27.7*  MCV 76.9* 78.2  PLT 545* 583*   Cardiac Enzymes: No results for input(s): CKTOTAL, CKMB, CKMBINDEX, TROPONINI in the last 168 hours. BNP: Invalid input(s): POCBNP CBG: No results for input(s): GLUCAP in the last 168 hours. D-Dimer No results for input(s): DDIMER in the last 72 hours. Hgb A1c No results for input(s): HGBA1C in the last 72 hours. Lipid Profile No results for input(s): CHOL, HDL, LDLCALC, TRIG, CHOLHDL, LDLDIRECT in the last 72 hours. Thyroid function studies No results for input(s): TSH, T4TOTAL, T3FREE, THYROIDAB in the last 72 hours.  Invalid input(s): FREET3 Anemia work up No results for input(s):  VITAMINB12, FOLATE, FERRITIN, TIBC, IRON, RETICCTPCT in the last 72 hours. Urinalysis    Component Value Date/Time   COLORURINE YELLOW 03/10/2016 1106   APPEARANCEUR CLEAR 03/10/2016 1106   LABSPEC 1.005 03/10/2016 1106   PHURINE 6.0 03/10/2016 1106   GLUCOSEU NEGATIVE 03/10/2016 1106   HGBUR NEGATIVE 03/10/2016 1106   [place] 03/10/2016 1106   [address] 03/10/2016 1106   PROTEINUR NEGATIVE 03/10/2016 1106   UROBILINOGEN 1.0 02/20/2012 1200   NITRITE NEGATIVE 03/10/2016 1106   LEUKOCYTESUR NEGATIVE 03/10/2016 1106   Sepsis Labs Invalid input(s): PROCALCITONIN,  WBC,  LACTICIDVEN Microbiology No results found for this or any previous visit (from the past 240 hour(s)).   Time coordinating discharge: 45 minutes  SIGNED:   Tawni Millers, MD  [facility] 03/11/2016, 2:25 PM Pager   If 7PM-7AM, please contact night-coverage [email or web address] Password [email or web address]

## 2016-03-11 NOTE — Telephone Encounter (Addendum)
Per Tilda Franco, RN on [address], patient is OK to receive radiation treatment today.  Also reviewed with Dr. Lisbeth Renshaw.  Notified [facility] that patient can be treated today.

## 2016-03-11 NOTE — Progress Notes (Signed)
03/11/16  0740  Notified Dr Cathlean Sauer that patient has Tele order and if patient needs tele then he needs to be transferred. Waiting on response.

## 2016-03-11 NOTE — Progress Notes (Signed)
03/11/16  1500  Reviewed discharge instructions with patient and girlfriend. Patient verbalized understanding of discharge instructions. Copy of discharge instructions given to patient.

## 2016-03-14 ENCOUNTER — Encounter: Payer: Self-pay | Admitting: *Deleted

## 2016-03-14 ENCOUNTER — Ambulatory Visit
Admission: RE | Admit: 2016-03-14 | Discharge: 2016-03-14 | Disposition: A | Discharge status: Home / Self Care | Payer: Medicaid Other | Source: Ambulatory Visit | Attending: Radiation Oncology | Admitting: Radiation Oncology

## 2016-03-14 DIAGNOSIS — Z510 Encounter for antineoplastic radiation therapy: Secondary | ICD-10-CM | POA: Diagnosis not present

## 2016-03-15 ENCOUNTER — Ambulatory Visit
Admission: RE | Admit: 2016-03-15 | Discharge: 2016-03-15 | Disposition: A | Discharge status: Home / Self Care | Payer: Medicaid Other | Source: Ambulatory Visit | Attending: Radiation Oncology | Admitting: Radiation Oncology

## 2016-03-15 ENCOUNTER — Encounter: Payer: Self-pay | Admitting: Radiation Oncology

## 2016-03-15 ENCOUNTER — Encounter: Payer: Self-pay | Admitting: *Deleted

## 2016-03-15 ENCOUNTER — Other Ambulatory Visit: Payer: Medicaid Other

## 2016-03-15 VITALS — BP 108/78 | HR 127 | Temp 99.7°F | Ht 70.0 in | Wt 152.0 lb

## 2016-03-15 DIAGNOSIS — C4442 Squamous cell carcinoma of skin of scalp and neck: Secondary | ICD-10-CM

## 2016-03-15 DIAGNOSIS — Z510 Encounter for antineoplastic radiation therapy: Secondary | ICD-10-CM | POA: Diagnosis not present

## 2016-03-15 NOTE — Progress Notes (Signed)
Johnny Mooney is here before his 12th fraction of radiation to his Neck. He reports pain to his radiation site/tumor a 10/10. He is taking oxycodone but only at night. He asks today for something else for pain and mentions hydrocodone has worked for him in the past. He recently received a blood transfusion at the hospital at the end of last week. He tells me had a bloody nose this morning which lasted for a long period, and he tells me now that it still has not stopped. He tells me he is eating well. He is drinking liquids also during the day. He is having daily bowel movements. His radiation site is covered with a dressing at this time. He has asked that we redress the wound today before his radiation appointment.   BP 108/78   Pulse (!) 127   Temp 99.7 F (37.6 C)   Ht 5\' 10"  (1.778 m)   Wt 152 lb (68.9 kg)   BMI 21.81 kg/m    Wt Readings from Last 3 Encounters:  03/15/16 152 lb (68.9 kg)  03/10/16 152 lb (68.9 kg)  03/08/16 158 lb 3.2 oz (71.8 kg)

## 2016-03-15 NOTE — Progress Notes (Signed)
[place] Work  Clinical Social Work met with patient and patient's girlfriend, [name].  CSW assisted patient in completing [employer] application.  Patient plans to utilized [employer] transportation, they are currently having car trouble and have no other applicable transportation options.  Polo Riley, MSW, LCSW, OSW-C Clinical Social Worker [facility] [phone]

## 2016-03-15 NOTE — Progress Notes (Signed)
Department of Radiation Oncology  Phone:  [phone] Fax:        [phone]  Weekly Treatment Note    Name: Johnny Mooney Date: 03/15/2016 MRN: [id number] DOB: 1971/01/02   Diagnosis:     ICD-9-CM ICD-10-CM   1. 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 magic mouthwash SOLN Take 5 mLs by mouth 3 (three) times daily as needed for mouth pain. 120 mL 0  . magnesium chloride (SLOW-MAG) 64 MG TBEC SR tablet Take 2 tablets (128 mg total) by mouth 2 (two) times daily. 30 tablet 0  . naproxen (NAPROSYN) 500 MG tablet Take 1 tablet (500 mg total) by mouth 2 (two) times daily with a meal. 30 tablet 0  . ondansetron (ZOFRAN-ODT) 4 MG disintegrating tablet Take 1 tablet (4 mg total) by mouth every 6 (six) hours as needed for nausea. 20 tablet 0  . oxyCODONE (ROXICODONE) 5 MG/5ML solution Take 5 mLs (5 mg total) by mouth every 4 (four) hours as needed for severe pain. 150 mL 0  . polyethylene glycol (MIRALAX / GLYCOLAX) packet Take 17 g by mouth daily. 14 each 0  . senna (SENOKOT) 8.6 MG TABS tablet Take 1 tablet (8.6 mg total) by mouth at bedtime. 30 each 0  . terbinafine (LAMISIL) 1 % cream Apply topically 2 (two) times daily. Use for up to 2 weeks. 30 g 0  . feeding supplement, ENSURE ENLIVE, (ENSURE ENLIVE) LIQD Take 237 mLs by mouth 2 (two) times daily between meals. (Patient not taking: Reported on 03/08/2016) 237 mL 12   No current facility-administered medications for this encounter.      ALLERGIES: Patient has no known allergies.   LABORATORY DATA:  Lab Results  Component Value Date   WBC 25.2 (H) 03/11/2016   HGB 9.6 (L) 03/11/2016   HCT 27.7 (L) 03/11/2016   MCV 78.2 03/11/2016   PLT 583 (H) 03/11/2016   Lab Results  Component Value Date   NA 135 03/10/2016   K 4.2 03/10/2016   CL 105 03/10/2016   CO2 21 (L)  03/10/2016   Lab Results  Component Value Date   ALT 73 (H) 03/10/2016   AST 48 (H) 03/10/2016   ALKPHOS 249 (H) 03/10/2016   BILITOT 0.7 03/10/2016     NARRATIVE: Johnny Mooney was seen today for weekly treatment management. The chart was checked and the patient's films were reviewed.  The patient complains of the feeling that he has with his current pain medicine, oxycodone. He states that hydrocodone has worked better in terms of balancing pain relief and how he feels. He is only taking oxycodone once a day now so there certainly is room to increase this in terms of safety.   Johnny Mooney is here before his 12th fraction of radiation to his Neck. He reports pain to his radiation site/tumor a 10/10. He is taking oxycodone but only at night. He asks today for something else for pain and mentions hydrocodone has worked for him in the past. He recently received a blood transfusion at the hospital at the end of last week. He tells me had a bloody nose this morning which lasted for a long period, and he tells me now that it still has not stopped. He tells me he is eating well. He is drinking liquids also during the day. He  is having daily bowel movements. His radiation site is covered with a dressing at this time. He has asked that we redress the wound today before his radiation appointment.   BP 108/78   Pulse (!) 127   Temp 99.7 F (37.6 C)   Ht 5\' 10"  (1.778 m)   Wt 152 lb (68.9 kg)   BMI 21.81 kg/m    Wt Readings from Last 3 Encounters:  03/15/16 152 lb (68.9 kg)  03/10/16 152 lb (68.9 kg)  03/08/16 158 lb 3.2 oz (71.8 kg)    PHYSICAL EXAMINATION: height is 5\' 10"  (1.778 m) and weight is 152 lb (68.9 kg). His temperature is 99.7 F (37.6 C). His blood pressure is 108/78 and his pulse is 127 (abnormal).      Large superficial tumor in the right posterior lateral neck. Ulceration superficially present. No marketed erythema surrounding this area currently.  ASSESSMENT: The  patient is doing satisfactorily with treatment.  He is not taking pain medicine as much as he can do to the way it makes him feel. [name] has been working with him in terms of balancing the various issues involved in terms of his pain medication regimen. I discussed with him that I do not advocate making a change at this point today. I discussed the short-term difficulty with pain medication versus a long-term benefit of getting through treatment that has been planned for him. He will further discuss this issue with [name] and I will defer to her in terms of any possible change. The patient's dressing was changed today.  PLAN: We will continue with the patient's radiation treatment as planned.

## 2016-03-15 NOTE — Progress Notes (Signed)
Oncology Nurse Navigator Documentation  Met with Mr. Bouldin prior to and during WUT with Dr. Lisbeth Renshaw.  He had arrived in wheelchair, stated his brother was waiting for him in lobby. Removed dressing over radiation site for Dr. Ida Rogue eval, assisted RN Anderson Malta with redressing site with wet-to-dry. Took him to [facility] 3 for tmt.  Gayleen Orem, RN, BSN, [place]ck Oncology Nurse [place] at [place] [phone]

## 2016-03-16 ENCOUNTER — Ambulatory Visit
Admission: RE | Admit: 2016-03-16 | Discharge: 2016-03-16 | Disposition: A | Discharge status: Home / Self Care | Payer: Medicaid Other | Source: Ambulatory Visit | Attending: Radiation Oncology | Admitting: Radiation Oncology

## 2016-03-16 ENCOUNTER — Ambulatory Visit: Payer: Medicaid Other | Attending: Family Medicine

## 2016-03-16 DIAGNOSIS — C4442 Squamous cell carcinoma of skin of scalp and neck: Secondary | ICD-10-CM

## 2016-03-16 DIAGNOSIS — Z510 Encounter for antineoplastic radiation therapy: Secondary | ICD-10-CM | POA: Diagnosis not present

## 2016-03-16 LAB — COMPLETE METABOLIC PANEL WITH GFR
ALBUMIN: 2.4 g/dL — AB (ref 3.6–5.1)
ALK PHOS: 271 U/L — AB (ref 40–115)
ALT: 30 U/L (ref 9–46)
AST: 14 U/L (ref 10–40)
BILIRUBIN TOTAL: 0.5 mg/dL (ref 0.2–1.2)
BUN: 8 mg/dL (ref 7–25)
CO2: 23 mmol/L (ref 20–31)
CREATININE: 0.71 mg/dL (ref 0.60–1.35)
Calcium: 8.7 mg/dL (ref 8.6–10.3)
Chloride: 98 mmol/L (ref 98–110)
GFR, Est African American: >89 mL/min (ref >=60)
GFR, Est Non African American: >89 mL/min (ref >=60)
GLUCOSE: 133 mg/dL — AB (ref 65–99)
Potassium: 4.4 mmol/L (ref 3.5–5.3)
SODIUM: 134 mmol/L — AB (ref 135–146)
Total Protein: 6.8 g/dL (ref 6.1–8.1)

## 2016-03-16 NOTE — Progress Notes (Signed)
Patient here for lab visit only 

## 2016-03-17 ENCOUNTER — Other Ambulatory Visit: Payer: Self-pay | Admitting: Radiation Oncology

## 2016-03-17 ENCOUNTER — Other Ambulatory Visit: Payer: Self-pay

## 2016-03-17 ENCOUNTER — Encounter: Payer: Self-pay | Admitting: *Deleted

## 2016-03-17 ENCOUNTER — Ambulatory Visit
Admission: RE | Admit: 2016-03-17 | Discharge: 2016-03-17 | Disposition: A | Discharge status: Home / Self Care | Payer: Medicaid Other | Source: Ambulatory Visit | Attending: Radiation Oncology | Admitting: Radiation Oncology

## 2016-03-17 ENCOUNTER — Telehealth: Payer: Self-pay | Admitting: *Deleted

## 2016-03-17 DIAGNOSIS — C4442 Squamous cell carcinoma of skin of scalp and neck: Secondary | ICD-10-CM

## 2016-03-17 DIAGNOSIS — Z510 Encounter for antineoplastic radiation therapy: Secondary | ICD-10-CM | POA: Diagnosis not present

## 2016-03-17 LAB — MAGNESIUM: Magnesium: 1.7 mg/dL (ref 1.5–2.5)

## 2016-03-17 MED ORDER — HYDROCODONE-ACETAMINOPHEN 5-325 MG PO TABS: 1.0000 | ORAL_TABLET | ORAL | 0 refills | Status: DC | PRN

## 2016-03-17 MED ORDER — OXYCODONE-ACETAMINOPHEN 5-325 MG PO TABS
1.0000 | ORAL_TABLET | Freq: Once | ORAL | Status: AC
  Administered 2016-03-17: 1 via ORAL
  Filled 2016-03-17: qty 1

## 2016-03-17 MED ORDER — HYDROCODONE-ACETAMINOPHEN 5-325 MG PO TABS
1.0000 | ORAL_TABLET | Freq: Once | ORAL | Status: AC
  Filled 2016-03-17: qty 1

## 2016-03-17 NOTE — Telephone Encounter (Signed)
Oncology Nurse Navigator Documentation  Received call from [name].    He stated he was "in excruciating pain", available oxycodone not bringing resolution.  He indicated Percocet is the only analgesic that provides necessary relief.  I noted a 2:15 appt has been arranged for him to see [name] tomorrow prior to his 2:45 RT to address his pain medication needs.  Until then, I indicated I would obtain a Rx for a 24 hr supply of Percocet and 1 dose for when he arrives for today's 2:45 RT.    I called him back at 1:45 after obtaining Rx from Dr. Harl Favor.  I encouraged him to arrive 30 minutes prior to tmt so he can receive dose prior to tmt.  I emphasized importance of timeliness for today's RT as we;; as tomorrow's appts.  He voiced understanding.  Gayleen Orem, RN, BSN, [place] Neck Oncology Nurse [place] at [place] [phone]

## 2016-03-17 NOTE — Progress Notes (Signed)
Received a call from patient's girlfriend Virgilio Frees.  Johnny Mooney reports the patient is in extreme pain and only has two days left of his pain medication.  He is also out of his ativan.  She was very tearful and stressed over the phone.  Expressing her worries and concerns.  She reports no other physician will give them refills.  Suggested that patient come into nursing at 2:15 prior to Fridays treatment, ([name] is out of the office today-Thursday) girlfriend agreed and was appreciative.  Instructed to head to the ER is pain can not be managed at home.  Notified Dr.Squires nurse Anderson Malta who has been in contact with [name].

## 2016-03-17 NOTE — Progress Notes (Signed)
Johnny Mooney presented today for his radiation treatment. He was asked to come 1 hour early to receive pain medicine so that it would be effective for his radiation treatment. His radiation treatment was scheduled for 2:45 pm, and he arrived at 3:20 pm. He was given the one time dose of percocet at approximately 3:40. He is aware that his treatment will be delayed today because of other previously scheduled treatments. He requested and was given a warm cloth to his forehead, a warm blanket, and the portable suction machine to use with a yaunkers to manage his secretions while he waits for his treatment. The Radiation Therapists are aware of the above and will notify nursing if he has further needs.

## 2016-03-18 ENCOUNTER — Encounter: Payer: Self-pay | Admitting: *Deleted

## 2016-03-18 ENCOUNTER — Ambulatory Visit
Admission: RE | Admit: 2016-03-18 | Discharge: 2016-03-18 | Disposition: A | Discharge status: Home / Self Care | Payer: Medicaid Other | Source: Ambulatory Visit | Attending: Radiation Oncology | Admitting: Radiation Oncology

## 2016-03-18 ENCOUNTER — Encounter: Payer: Self-pay | Admitting: Radiation Oncology

## 2016-03-18 ENCOUNTER — Telehealth: Payer: Self-pay | Admitting: *Deleted

## 2016-03-18 VITALS — BP 106/81 | HR 130 | Temp 100.3°F | Resp 16 | Ht 70.0 in | Wt 152.0 lb

## 2016-03-18 DIAGNOSIS — C4442 Squamous cell carcinoma of skin of scalp and neck: Secondary | ICD-10-CM

## 2016-03-18 DIAGNOSIS — Z510 Encounter for antineoplastic radiation therapy: Secondary | ICD-10-CM | POA: Diagnosis not present

## 2016-03-18 MED ORDER — OXYCODONE-ACETAMINOPHEN 5-325 MG PO TABS: 1.0000 | ORAL_TABLET | ORAL | 0 refills | Status: DC | PRN

## 2016-03-18 NOTE — Telephone Encounter (Signed)
Medical Assistant left message on patient's home and cell voicemail. Voicemail states to give a call back to Yohan Samons with [facility] at [phone].  

## 2016-03-18 NOTE — Progress Notes (Addendum)
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  CBCT/MVCT images/Port film x-rays were reviewed.  The chart was checked.   The patient presents today for his 14th fraction to his neck. He reports pain to his radiation site, which he reports is 4/10 in severity. He is taking hydrocodone for this pain, and oxycodone as needed only at night. He reports he is eating well and drinking liquids during the day. He reports daily bowel movements. He has been using a suction machine to clear secretions and is asking for a suction machine at home.   Physical Findings:  Wt Readings from Last 3 Encounters:  03/18/16 152 lb (68.9 kg)  03/15/16 152 lb (68.9 kg)  03/10/16 152 lb (68.9 kg)   Sitting upright in WC.  Using yankauer suction device.  CBC    Component Value Date/Time   WBC 25.2 (H) 03/11/2016 0654   RBC 3.54 (L) 03/11/2016 0654   HGB 9.6 (L) 03/11/2016 0654   HGB 11.7 (L) 02/25/2016 1339   HCT 27.7 (L) 03/11/2016 0654   HCT 35.4 (L) 02/25/2016 1339   PLT 583 (H) 03/11/2016 0654   PLT 414 (H) 02/25/2016 1339   MCV 78.2 03/11/2016 0654   MCV 80.8 02/25/2016 1339   MCH 27.1 03/11/2016 0654   MCHC 34.7 03/11/2016 0654   RDW 15.0 03/11/2016 0654   RDW 13.7 02/25/2016 1339   LYMPHSABS 0.9 03/10/2016 1008   LYMPHSABS 2.5 02/25/2016 1339   MONOABS 1.9 (H) 03/10/2016 1008   MONOABS 3.0 (H) 02/25/2016 1339   EOSABS 0.1 03/10/2016 1008   EOSABS 0.0 02/25/2016 1339   BASOSABS 0.0 03/10/2016 1008   BASOSABS 0.1 02/25/2016 1339     CMP     Component Value Date/Time   NA 134 (L) 03/16/2016 1558   NA 138 02/25/2016 1339   K 4.4 03/16/2016 1558   K 3.5 02/25/2016 1339   CL 98 03/16/2016 1558   CO2 23 03/16/2016 1558   CO2 30 (H) 02/25/2016 1339   GLUCOSE 133 (H) 03/16/2016 1558   GLUCOSE 118 02/25/2016  1339   BUN 8 03/16/2016 1558   BUN 72.3 (H) 02/25/2016 1339   CREATININE 0.71 03/16/2016 1558   CREATININE 2.2 (H) 02/25/2016 1339   CALCIUM 8.7 03/16/2016 1558   CALCIUM >16.0 (HH) 02/25/2016 1339   PROT 6.8 03/16/2016 1558   PROT 9.1 (H) 02/25/2016 1339   ALBUMIN 2.4 (L) 03/16/2016 1558   ALBUMIN 3.0 (L) 02/25/2016 1339   AST 14 03/16/2016 1558   AST 61 (H) 02/25/2016 1339   ALT 30 03/16/2016 1558   ALT 38 02/25/2016 1339   ALKPHOS 271 (H) 03/16/2016 1558   ALKPHOS 194 (H) 02/25/2016 1339   BILITOT 0.5 03/16/2016 1558   BILITOT 0.43 02/25/2016 1339   GFRNONAA >89 03/16/2016 1558   GFRAA >89 03/16/2016 1558    Impression:  The patient is tolerating radiotherapy.   Plan:  Continue radiotherapy as planned.  Yankauer suction ordered from home health.  I will refill the patient's pain medication today - percocet 1 tab q4hrs prn,  to take him until Monday 03/21/16, and will continue to prescribe pain medication weekly as needed.  Patient interested in seeing medical oncology as needed for supportive care, if recommended. I will see if med/onc recommends this.  -----------------------------------  Eppie Gibson, MD  This document serves as a record of services personally performed by Eppie Gibson, MD. It was created on her behalf by Maryla Morrow, a trained medical scribe. The creation of this record is based on the scribe's personal observations and the provider's statements to them. This document has been checked and approved by the attending provider.

## 2016-03-18 NOTE — Telephone Encounter (Signed)
-----   Message from Arnoldo Morale, MD sent at 03/17/2016  2:16 PM EST ----- Labs are stable compared to previous; he still does have an elevated liver enzyme which could be from his cancer

## 2016-03-18 NOTE — Progress Notes (Signed)
Mr. Johnny Mooney is here before his 14th fraction of radiation to his Neck. He reports pain to his radiation site/tumor a 4/10 taking Hydrocodone. He is taking oxycodone but only at night .  of last week.  He tells me he is eating well. He is drinking liquids also during the day. He is having daily bowel movements. His radiation site is covered with a dressing at this time.  Using suction machine clear secretion  Suctioned out  and asking for a suction machine at home. Wt Readings from Last 3 Encounters:  03/18/16 152 lb (68.9 kg)  03/15/16 152 lb (68.9 kg)  03/10/16 152 lb (68.9 kg)  BP 106/81   Pulse (!) 130   Temp 100.3 F (37.9 C) (Oral)   Resp 16   Ht 5\' 10"  (1.778 m)   Wt 152 lb (68.9 kg)   SpO2 98%   BMI 21.81 kg/m

## 2016-03-19 NOTE — Progress Notes (Signed)
Oncology Nurse Navigator Documentation  Met with Johnny Mooney for additional WUT with [name] to specifically address pain mgt and request for portable suction.  He arrived in [facility], was accompanied by girlfriend [name].  He received additional Rx for Percocet to last until next Monday's WUT.  He voiced understanding [name] will re-issue Rx weekly.  He expressed new onset excessive saliva production, stated significant benefit from suction made available yesterday.  He understands [name] will place order for home delivery of suction equipment.  I provided bottle saline, saline syringe, box of 4X4 gauze, Medipore tape for dsg change prior to RT. I escorted him to L3 for treatment.  Gayleen Orem, RN, BSN, [place]ck Oncology Nurse [place] at [place] [phone]

## 2016-03-21 ENCOUNTER — Ambulatory Visit
Admission: RE | Admit: 2016-03-21 | Discharge: 2016-03-21 | Disposition: A | Discharge status: Home / Self Care | Payer: Medicaid Other | Source: Ambulatory Visit | Attending: Radiation Oncology | Admitting: Radiation Oncology

## 2016-03-21 ENCOUNTER — Telehealth: Payer: Self-pay | Admitting: *Deleted

## 2016-03-21 ENCOUNTER — Encounter: Payer: Self-pay | Admitting: Radiation Oncology

## 2016-03-21 ENCOUNTER — Encounter: Payer: Self-pay | Admitting: *Deleted

## 2016-03-21 DIAGNOSIS — C4442 Squamous cell carcinoma of skin of scalp and neck: Secondary | ICD-10-CM

## 2016-03-21 DIAGNOSIS — Z510 Encounter for antineoplastic radiation therapy: Secondary | ICD-10-CM | POA: Diagnosis not present

## 2016-03-21 LAB — COMPREHENSIVE METABOLIC PANEL
ALBUMIN: 2.1 g/dL — AB (ref 3.5–5.0)
ALK PHOS: 275 U/L — AB (ref 40–150)
ALT: 23 U/L (ref 0–55)
AST: 24 U/L (ref 5–34)
Anion Gap: 11 mEq/L (ref 3–11)
BUN: 11.4 mg/dL (ref 7.0–26.0)
CALCIUM: 9.9 mg/dL (ref 8.4–10.4)
CHLORIDE: 99 meq/L (ref 98–109)
CO2: 25 mEq/L (ref 22–29)
Creatinine: 0.8 mg/dL (ref 0.7–1.3)
Glucose: 111 mg/dl (ref 70–140)
POTASSIUM: 4 meq/L (ref 3.5–5.1)
SODIUM: 135 meq/L — AB (ref 136–145)
Total Bilirubin: 0.41 mg/dL (ref 0.20–1.20)
Total Protein: 8.7 g/dL — ABNORMAL HIGH (ref 6.4–8.3)

## 2016-03-21 LAB — CBC WITH DIFFERENTIAL/PLATELET
BASO%: 0.2 % (ref 0.0–2.0)
Basophils Absolute: 0.1 10*3/uL (ref 0.0–0.1)
EOS ABS: 0.1 10*3/uL (ref 0.0–0.5)
EOS%: 0.4 % (ref 0.0–7.0)
HEMATOCRIT: 24.8 % — AB (ref 38.4–49.9)
HEMOGLOBIN: 8.3 g/dL — AB (ref 13.0–17.1)
LYMPH#: 1.3 10*3/uL (ref 0.9–3.3)
LYMPH%: 4.9 % — ABNORMAL LOW (ref 14.0–49.0)
MCH: 26.3 pg — ABNORMAL LOW (ref 27.2–33.4)
MCHC: 33.5 g/dL (ref 32.0–36.0)
MCV: 78.5 fL — ABNORMAL LOW (ref 79.3–98.0)
MONO#: 2.9 10*3/uL — AB (ref 0.1–0.9)
MONO%: 11 % (ref 0.0–14.0)
NEUT%: 83.5 % — ABNORMAL HIGH (ref 39.0–75.0)
NEUTROS ABS: 21.8 10*3/uL — AB (ref 1.5–6.5)
NRBC: 0 % (ref 0–0)
Platelets: 880 10*3/uL — ABNORMAL HIGH (ref 140–400)
RBC: 3.16 10*6/uL — ABNORMAL LOW (ref 4.20–5.82)
RDW: 15.8 % — ABNORMAL HIGH (ref 11.0–14.6)
WBC: 26.1 10*3/uL — AB (ref 4.0–10.3)

## 2016-03-21 LAB — TECHNOLOGIST REVIEW

## 2016-03-21 MED ORDER — OXYCODONE-ACETAMINOPHEN 5-325 MG PO TABS: 1.0000 | ORAL_TABLET | ORAL | 0 refills | Status: DC | PRN

## 2016-03-21 MED ORDER — LIDOCAINE VISCOUS 2 % MT SOLN: OROMUCOSAL | 5 refills | Status: DC

## 2016-03-21 MED ORDER — LIDOCAINE VISCOUS 2 % MT SOLN: OROMUCOSAL | 5 refills | Status: AC

## 2016-03-21 NOTE — Progress Notes (Addendum)
Weekly Management Note:  Outpatient      ICD-9-CM ICD-10-CM   1. 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  CBCT/MVCT images/Port film x-rays were reviewed.  The chart was checked.   Poor intake, poor taste. 13 lb loss in 3 days.  Reports copious hydration but few calories.    Tachycardic.  Sore throat affects ability to eat. Percocet helps.   Physical Findings:  Wt Readings from Last 3 Encounters:  03/21/16 139 lb 3.2 oz (63.1 kg)  03/18/16 152 lb (68.9 kg)  03/15/16 152 lb (68.9 kg)   Sitting upright in WC.  L;arge neck mass unchanged since consult Oral cavity clear, no thrush, Right tympanic membrane clear.   CBC    Component Value Date/Time   WBC 25.2 (H) 03/11/2016 0654   RBC 3.54 (L) 03/11/2016 0654   HGB 9.6 (L) 03/11/2016 0654   HGB 11.7 (L) 02/25/2016 1339   HCT 27.7 (L) 03/11/2016 0654   HCT 35.4 (L) 02/25/2016 1339   PLT 583 (H) 03/11/2016 0654   PLT 414 (H) 02/25/2016 1339   MCV 78.2 03/11/2016 0654   MCV 80.8 02/25/2016 1339   MCH 27.1 03/11/2016 0654   MCHC 34.7 03/11/2016 0654   RDW 15.0 03/11/2016 0654   RDW 13.7 02/25/2016 1339   LYMPHSABS 0.9 03/10/2016 1008   LYMPHSABS 2.5 02/25/2016 1339   MONOABS 1.9 (H) 03/10/2016 1008   MONOABS 3.0 (H) 02/25/2016 1339   EOSABS 0.1 03/10/2016 1008   EOSABS 0.0 02/25/2016 1339   BASOSABS 0.0 03/10/2016 1008   BASOSABS 0.1 02/25/2016 1339     CMP     Component Value Date/Time   NA 134 (L) 03/16/2016 1558   NA 138 02/25/2016 1339   K 4.4 03/16/2016 1558   K 3.5 02/25/2016 1339   CL 98 03/16/2016 1558   CO2 23 03/16/2016 1558   CO2 30 (H) 02/25/2016 1339   GLUCOSE 133 (H) 03/16/2016 1558   GLUCOSE 118 02/25/2016 1339   BUN 8 03/16/2016 1558   BUN 72.3 (H) 02/25/2016 1339   CREATININE 0.71 03/16/2016 1558   CREATININE 2.2 (H) 02/25/2016 1339   CALCIUM 8.7 03/16/2016 1558   CALCIUM >16.0 (HH) 02/25/2016 1339   PROT 6.8 03/16/2016 1558   PROT 9.1 (H) 02/25/2016 1339   ALBUMIN 2.4 (L) 03/16/2016 1558   ALBUMIN 3.0 (L) 02/25/2016 1339   AST 14 03/16/2016 1558   AST 61 (H) 02/25/2016 1339   ALT 30 03/16/2016 1558   ALT 38 02/25/2016 1339   ALKPHOS 271 (H) 03/16/2016 1558   ALKPHOS 194 (H) 02/25/2016 1339   BILITOT 0.5 03/16/2016 1558   BILITOT 0.43 02/25/2016 1339   GFRNONAA >89 03/16/2016 1558   GFRAA >89 03/16/2016 1558    Impression:  The patient is tolerating radiotherapy.   Plan:  Continue radiotherapy as planned.  Yankauer suction delivered from home health.  I will refill the patient's pain medication today - percocet 1 tab q4hrs prn,  to take him until Monday 03-28-16 and will continue to prescribe pain medication weekly as needed.  Lidocaine also Rxd for sore throat  Refer back to nutrition  Labs ordered due to weight loss and history of anemia  -----------------------------------  Eppie Gibson, MD  This document serves as a record of services personally performed by Eppie Gibson, MD. It was created on her behalf by Maryla Morrow, a trained medical scribe. The creation of this record is based on the scribe's personal observations and the provider's statements to them. This document has been checked and approved by the attending provider.

## 2016-03-21 NOTE — Telephone Encounter (Signed)
Oncology Nurse Navigator Documentation  Called Johnny Mooney, informed him earlier RT appt not available.  I encouraged him to arrive by 1:45, no later than 2:00, so he can see [name], dsg can be changed.  He voiced understanding.  Gayleen Orem, RN, BSN, [place]ck Oncology Nurse [place] at [place] [phone]

## 2016-03-21 NOTE — Progress Notes (Addendum)
Oncology Nurse Navigator Documentation  Met with Mr. Johnny Mooney prior to/during WUT with [name] which preceded 2:45 RT.  He arrived in [facility]. With RN Jennifer's assist, removed scalp dsg for Dr. Pearlie Oyster eval, bathed would with saline per pt request, redressed with wet-to-dry. Escorted him to [facility] 3 for RT.  Gayleen Orem, RN, BSN, [place] Neck Oncology Nurse [place] at [place] [phone]

## 2016-03-21 NOTE — Telephone Encounter (Signed)
Oncology Nurse Navigator Documentation  Received call from Johnny Mooney.  He reported:  Needs scalp dressing change.  Experiencing increasing throat pain, difficult to eat/drink. Taking Percocet PRN.  No appetite.  Received suction device on Friday, very beneficial.  Asked to be seen ASAP.  I explained 2:45 tmt time cannot be adjusted but if he arrives early, we will try to get him to be seen by [name] before tmt, dsg can be changed.  I noted I would check on alternate treatment time in the event there is a possible adjustment I am not aware of, will call him back.  Johnny Orem, RN, BSN, [place] Neck Oncology Nurse [place] at [place] [phone]

## 2016-03-21 NOTE — Progress Notes (Signed)
Johnny Mooney presents before his 15th fraction of radiation to his Neck. He reports pain a 7/10 to his head. He is taking 1 percocet every 6 hours with relief. He feels like this medicine is working well for him. He is not eating well. He had some soup today. He reports pain to his throat which has limited what he can eat. He also reports that his taste buds have disappeared which alters what he will eat. He is drinking a lot of fluids daily. He tells me about 8 bottles of water daily, 8 ounce and 16 ounce. His dresssing has been removed from his posterior head for assessment by [name] today by Gayleen Orem RN.   BP 104/60   Pulse (!) 114   Temp 99.2 F (37.3 C)   Ht 5\' 10"  (1.778 m)   Wt 139 lb 3.2 oz (63.1 kg)   SpO2 100% Comment: room air  BMI 19.97 kg/m    Orthostatics: BP sitting 104/60, pulse 114. BP standing 122/78, pulse 120.  Wt Readings from Last 3 Encounters:  03/21/16 139 lb 3.2 oz (63.1 kg)  03/18/16 152 lb (68.9 kg)  03/15/16 152 lb (68.9 kg)

## 2016-03-22 ENCOUNTER — Ambulatory Visit
Admission: RE | Admit: 2016-03-22 | Discharge: 2016-03-22 | Disposition: A | Discharge status: Home / Self Care | Payer: Medicaid Other | Source: Ambulatory Visit | Attending: Radiation Oncology | Admitting: Radiation Oncology

## 2016-03-22 DIAGNOSIS — Z510 Encounter for antineoplastic radiation therapy: Secondary | ICD-10-CM | POA: Diagnosis not present

## 2016-03-23 ENCOUNTER — Telehealth: Payer: Self-pay | Admitting: Hematology and Oncology

## 2016-03-23 ENCOUNTER — Ambulatory Visit
Admission: RE | Admit: 2016-03-23 | Discharge: 2016-03-23 | Disposition: A | Discharge status: Home / Self Care | Payer: Medicaid Other | Source: Ambulatory Visit | Attending: Radiation Oncology | Admitting: Radiation Oncology

## 2016-03-23 ENCOUNTER — Encounter: Payer: Self-pay | Admitting: Hematology and Oncology

## 2016-03-23 DIAGNOSIS — Z510 Encounter for antineoplastic radiation therapy: Secondary | ICD-10-CM | POA: Diagnosis not present

## 2016-03-23 NOTE — Telephone Encounter (Signed)
Cld pt to schedule appt w/Dr. Alvy Bimler. Left the appt date and time on the pt's vm.

## 2016-03-24 ENCOUNTER — Encounter: Payer: Self-pay | Admitting: Nutrition

## 2016-03-24 ENCOUNTER — Encounter: Payer: Medicaid Other | Admitting: Nutrition

## 2016-03-24 ENCOUNTER — Ambulatory Visit
Admission: RE | Admit: 2016-03-24 | Discharge: 2016-03-24 | Disposition: A | Discharge status: Home / Self Care | Payer: Medicaid Other | Source: Ambulatory Visit | Attending: Radiation Oncology | Admitting: Radiation Oncology

## 2016-03-24 DIAGNOSIS — Z510 Encounter for antineoplastic radiation therapy: Secondary | ICD-10-CM | POA: Diagnosis not present

## 2016-03-24 NOTE — Progress Notes (Signed)
Patient did not show up for scheduled nutrition appointment. 

## 2016-03-25 ENCOUNTER — Ambulatory Visit
Admission: RE | Admit: 2016-03-25 | Discharge: 2016-03-25 | Disposition: A | Discharge status: Home / Self Care | Payer: Medicaid Other | Source: Ambulatory Visit | Attending: Radiation Oncology | Admitting: Radiation Oncology

## 2016-03-25 DIAGNOSIS — Z510 Encounter for antineoplastic radiation therapy: Secondary | ICD-10-CM | POA: Diagnosis not present

## 2016-03-28 ENCOUNTER — Ambulatory Visit
Admission: RE | Admit: 2016-03-28 | Discharge: 2016-03-28 | Disposition: A | Discharge status: Home / Self Care | Payer: Medicaid Other | Source: Ambulatory Visit | Attending: Radiation Oncology | Admitting: Radiation Oncology

## 2016-03-28 ENCOUNTER — Encounter: Payer: Self-pay | Admitting: Radiation Oncology

## 2016-03-28 DIAGNOSIS — Z510 Encounter for antineoplastic radiation therapy: Secondary | ICD-10-CM | POA: Diagnosis not present

## 2016-03-28 DIAGNOSIS — C4442 Squamous cell carcinoma of skin of scalp and neck: Secondary | ICD-10-CM

## 2016-03-28 MED ORDER — OXYCODONE-ACETAMINOPHEN 5-325 MG PO TABS: 1.0000 | ORAL_TABLET | ORAL | 0 refills | Status: AC | PRN

## 2016-03-28 MED ORDER — SENNA 8.6 MG PO TABS: 1.0000 | ORAL_TABLET | Freq: Every day | ORAL | 1 refills | Status: AC

## 2016-03-28 MED ORDER — NAPROXEN 500 MG PO TABS: 500.0000 mg | ORAL_TABLET | Freq: Two times a day (BID) | ORAL | 0 refills | Status: DC

## 2016-03-28 MED ORDER — MAGNESIUM CHLORIDE 64 MG PO TBEC: 2.0000 | DELAYED_RELEASE_TABLET | Freq: Two times a day (BID) | ORAL | 0 refills | Status: AC

## 2016-03-28 NOTE — Progress Notes (Addendum)
   Weekly Management Note:  Outpatient      ICD-9-CM ICD-10-CM   1. 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  CBCT/MVCT images/Port film x-rays were reviewed.  The chart was checked.  Feeling and doing relatively well.   Weight loss plateauing.    He reports more fluid emanating from tumor.  Doesn't feel chills or malaise.   Physical Findings:  Vitals:   03/28/16 1439  BP: 119/79  Pulse: (!) 117  Temp: (!) 100.4 F (38 C)    Wt Readings from Last 3 Encounters:  03/28/16 140 lb 3.2 oz (63.6 kg)  03/21/16 139 lb 3.2 oz (63.1 kg)  03/18/16 152 lb (68.9 kg)   Sitting upright in WC.  Large posterior neck mass slightly regressed.    CBC    Component Value Date/Time   WBC 26.1 (H) 03/21/2016 1555   WBC 25.2 (H) 03/11/2016 0654   RBC 3.16 (L) 03/21/2016 1555   RBC 3.54 (L) 03/11/2016 0654   HGB 8.3 (L) 03/21/2016 1555   HCT 24.8 (L) 03/21/2016 1555   PLT 880 (H) 03/21/2016 1555   MCV 78.5 (L) 03/21/2016 1555   MCH 26.3 (L) 03/21/2016 1555   MCH 27.1 03/11/2016 0654   MCHC 33.5 03/21/2016 1555   MCHC 34.7 03/11/2016 0654   RDW 15.8 (H) 03/21/2016 1555   LYMPHSABS 1.3 03/21/2016 1555   MONOABS 2.9 (H) 03/21/2016 1555   EOSABS 0.1 03/21/2016 1555   BASOSABS 0.1 03/21/2016 1555     CMP     Component Value Date/Time   NA 135 (L) 03/21/2016 1555   K 4.0 03/21/2016 1555   CL 98 03/16/2016 1558   CO2 25 03/21/2016 1555   GLUCOSE 111 03/21/2016 1555   BUN 11.4 03/21/2016 1555   CREATININE 0.8 03/21/2016 1555   CALCIUM 9.9 03/21/2016 1555   PROT 8.7 (H) 03/21/2016 1555   ALBUMIN 2.1 (L) 03/21/2016 1555   AST 24 03/21/2016 1555   ALT 23 03/21/2016 1555     ALKPHOS 275 (H) 03/21/2016 1555   BILITOT 0.41 03/21/2016 1555   GFRNONAA >89 03/16/2016 1558   GFRAA >89 03/16/2016 1558    Impression:  The patient is tolerating radiotherapy.   Plan:  Continue radiotherapy as planned.  Yankauer suction delivered from home health.  I will refill the patient's Mg, senna, Naproxen, and pain medication today - percocet 1 tab q4hrs prn   He has a low grade temp but feels okay; it was determined while inpatient by infectious disease that his fevers were probably tumor related as they didn't respond to ABX.  He was instructed to take Naproxen as an inpatient.  I refilled this today as he's run out.   -----------------------------------  Eppie Gibson, MD

## 2016-03-28 NOTE — Progress Notes (Signed)
Mr. Batdorf presents for his  Fraction of radiation to his Head and Neck. He reports pain a 10/10 to his head. He is taking percocet during the day. He tells me he is not taking it every 4 hours as ordered. He has some fatigue. He is eating better according to his mother. He has eaten food such as meats, fruit, vegetables. He is not drinking any nutritional supplements at this time. He is drinking fluids including water and gingerale. His dressing has been removed for assessment by [name] by Gayleen Orem RN. He is asking refills for magnesium, naprosyn and percocet.   BP 119/79   Pulse (!) 117   Temp (!) 100.4 F (38 C)   Ht 5\' 10"  (1.778 m)   Wt 140 lb 3.2 oz (63.6 kg)   SpO2 99%   BMI 20.12 kg/m    Wt Readings from Last 3 Encounters:  03/28/16 140 lb 3.2 oz (63.6 kg)  03/21/16 139 lb 3.2 oz (63.1 kg)  03/18/16 152 lb (68.9 kg)

## 2016-03-29 ENCOUNTER — Other Ambulatory Visit: Payer: Self-pay | Admitting: Radiation Oncology

## 2016-03-29 ENCOUNTER — Ambulatory Visit
Admission: RE | Admit: 2016-03-29 | Discharge: 2016-03-29 | Disposition: A | Discharge status: Home / Self Care | Payer: Medicaid Other | Source: Ambulatory Visit | Attending: Radiation Oncology | Admitting: Radiation Oncology

## 2016-03-29 DIAGNOSIS — Z510 Encounter for antineoplastic radiation therapy: Secondary | ICD-10-CM | POA: Diagnosis not present

## 2016-03-29 NOTE — Progress Notes (Signed)
Johnny Mooney presented after his radiation treatment today to have the dressing changed to his radiation site and wound area. The dressing was removed with saline due to extreme dryness of the gauze. A new wet to dry dressing was placed and taped without difficulty. He requested his temperature be taken as he was not feeling well. It was 102.5. I advised [name] of his temperature. She is aware that he has had work ups regarding a persistent fever, and has been documented as a possible tumor fever. She suggested a work up today including urine culture, chest x-ray, blood cultures. He denies urinary symptoms, cough.  Johnny Mooney has not been taking naprosyn as prescribed and was in a rush today to pick it up. He declined any further testing today. He will call the cancer center if he develops any new symptoms tonight or present to the emergency room. We will recheck his temperature tomorrow when he is here for radiation.

## 2016-03-30 ENCOUNTER — Ambulatory Visit: Payer: Medicaid Other | Admitting: Nutrition

## 2016-03-30 ENCOUNTER — Ambulatory Visit
Admission: RE | Admit: 2016-03-30 | Discharge: 2016-03-30 | Disposition: A | Discharge status: Home / Self Care | Payer: Medicaid Other | Source: Ambulatory Visit | Attending: Radiation Oncology | Admitting: Radiation Oncology

## 2016-03-30 DIAGNOSIS — Z510 Encounter for antineoplastic radiation therapy: Secondary | ICD-10-CM | POA: Diagnosis not present

## 2016-03-30 NOTE — Progress Notes (Signed)
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 Weight decreased to 140.2 pounds on March 5, down from 155.2 pounds January 26. Patient reports that his appetite is poor. He has taste alterations. He doesn't "love" oral nutrition supplements but agrees to try to drink them. Verbalizes that he does not want to lose more weight.  Nutrition diagnosis: Severe malnutrition continues.  Intervention: Educated patient to consume high-calorie high-protein foods in 6 small meals and snacks daily. Provided fact sheet on high-calorie, high-protein foods. Educated patient on strategies for consuming one oral nutrition supplement daily as a shake or smoothie and provided recipes. Educated patient on strategies to improve taste alterations. Questions were answered.  Teach back method used.  Contact information provided.  Monitoring, evaluation, goals: Patient will work to increase calories and protein to promote weight stabilization/weight gain as well as healing.  Next visit: Thursday, March 15 after radiation therapy.  **Disclaimer: This note was dictated with voice recognition software. Similar sounding words can inadvertently be transcribed and this note may contain transcription errors which may not have been corrected upon publication of note.**

## 2016-03-31 ENCOUNTER — Ambulatory Visit
Admission: RE | Admit: 2016-03-31 | Discharge: 2016-03-31 | Disposition: A | Discharge status: Home / Self Care | Payer: Medicaid Other | Source: Ambulatory Visit | Attending: Radiation Oncology | Admitting: Radiation Oncology

## 2016-03-31 DIAGNOSIS — Z510 Encounter for antineoplastic radiation therapy: Secondary | ICD-10-CM | POA: Diagnosis not present

## 2016-04-01 ENCOUNTER — Ambulatory Visit
Admission: RE | Admit: 2016-04-01 | Discharge: 2016-04-01 | Disposition: A | Discharge status: Home / Self Care | Payer: Medicaid Other | Source: Ambulatory Visit | Attending: Radiation Oncology | Admitting: Radiation Oncology

## 2016-04-01 DIAGNOSIS — Z510 Encounter for antineoplastic radiation therapy: Secondary | ICD-10-CM | POA: Diagnosis not present

## 2016-04-04 ENCOUNTER — Ambulatory Visit
Admission: RE | Admit: 2016-04-04 | Discharge: 2016-04-04 | Disposition: A | Discharge status: Home / Self Care | Payer: Medicaid Other | Source: Ambulatory Visit | Attending: Radiation Oncology | Admitting: Radiation Oncology

## 2016-04-04 ENCOUNTER — Encounter: Payer: Self-pay | Admitting: Radiation Oncology

## 2016-04-04 ENCOUNTER — Other Ambulatory Visit: Payer: Medicaid Other

## 2016-04-04 VITALS — Temp 98.5°F | Ht 70.0 in | Wt 141.4 lb

## 2016-04-04 DIAGNOSIS — C4442 Squamous cell carcinoma of skin of scalp and neck: Secondary | ICD-10-CM | POA: Insufficient documentation

## 2016-04-04 DIAGNOSIS — Z510 Encounter for antineoplastic radiation therapy: Secondary | ICD-10-CM | POA: Diagnosis not present

## 2016-04-04 MED ORDER — BIAFINE EX EMUL
CUTANEOUS | Status: DC | PRN
Start: 2016-04-04 — End: 2016-04-05
  Administered 2016-04-04: 15:00:00 via TOPICAL

## 2016-04-04 NOTE — Addendum Note (Signed)
Encounter addended by: Eppie Gibson, MD on: 04/04/2016  3:47 PM<BR>    Actions taken: Sign clinical note

## 2016-04-04 NOTE — Progress Notes (Signed)
Mr. Magos presents for radiation today. He reports pain to his neck and back. He is taking percocet every 4 hours for pain relief. He reports he is eating and drinking well. The skin to his neck is dry with some areas of peeling present. He was given biafine today and knows to begin using it twice daily as directed.   Temp 98.5 F (36.9 C)   Ht 5\' 10"  (1.778 m)   Wt 141 lb 6.4 oz (64.1 kg)   SpO2 100% Comment: room air  BMI 20.29 kg/m    Orthostatics: BP sitting 96/69 pulse 106. BP standing 97/75 pulse 112.   Wt Readings from Last 3 Encounters:  04/04/16 141 lb 6.4 oz (64.1 kg)  03/28/16 140 lb 3.2 oz (63.6 kg)  03/21/16 139 lb 3.2 oz (63.1 kg)

## 2016-04-04 NOTE — Addendum Note (Signed)
Encounter addended by: Eppie Gibson, MD on: 04/04/2016  3:46 PM<BR>    Actions taken: Sign clinical note

## 2016-04-04 NOTE — Progress Notes (Signed)
   Weekly Management Note:  Outpatient      ICD-9-CM ICD-10-CM   1. Squamous cell carcinoma of neck 195.0 C44.42 topical emolient (BIAFINE) emulsion    Current Dose: 52 Gy  Projected Dose: 70 Gy   Narrative:  The patient presents for routine under treatment assessment.  CBCT/MVCT images/Port film x-rays were reviewed.  The chart was checked. Gained a lb. No new complaints other than dry skin over neck.  Physical Findings:  Vitals:   04/04/16 1448  Temp: 98.5 F (36.9 C)    Wt Readings from Last 3 Encounters:  04/04/16 141 lb 6.4 oz (64.1 kg)  03/28/16 140 lb 3.2 oz (63.6 kg)  03/21/16 139 lb 3.2 oz (63.1 kg)   Sitting upright in WC.  Large posterior neck mass stable.  Skin around ulcerated mass is hyperpigmented, dry over neck.    CBC    Component Value Date/Time   WBC 26.1 (H) 03/21/2016 1555   WBC 25.2 (H) 03/11/2016 0654   RBC 3.16 (L) 03/21/2016 1555   RBC 3.54 (L) 03/11/2016 0654   HGB 8.3 (L) 03/21/2016 1555   HCT 24.8 (L) 03/21/2016 1555   PLT 880 (H) 03/21/2016 1555   MCV 78.5 (L) 03/21/2016 1555   MCH 26.3 (L) 03/21/2016 1555   MCH 27.1 03/11/2016 0654   MCHC 33.5 03/21/2016 1555   MCHC 34.7 03/11/2016 0654   RDW 15.8 (H) 03/21/2016 1555   LYMPHSABS 1.3 03/21/2016 1555   MONOABS 2.9 (H) 03/21/2016 1555   EOSABS 0.1 03/21/2016 1555   BASOSABS 0.1 03/21/2016 1555     CMP     Component Value Date/Time   NA 135 (L) 03/21/2016 1555   K 4.0 03/21/2016 1555   CL 98 03/16/2016 1558   CO2 25 03/21/2016 1555   GLUCOSE 111 03/21/2016 1555   BUN 11.4 03/21/2016 1555   CREATININE 0.8 03/21/2016 1555   CALCIUM 9.9 03/21/2016 1555   PROT 8.7 (H) 03/21/2016 1555   ALBUMIN 2.1 (L) 03/21/2016 1555   AST 24 03/21/2016 1555   ALT 23 03/21/2016 1555   ALKPHOS 275 (H) 03/21/2016 1555   BILITOT 0.41 03/21/2016 1555   GFRNONAA >89 03/16/2016 1558   GFRAA >89 03/16/2016 1558    Impression:  The patient is tolerating radiotherapy.   Plan:  Continue  radiotherapy as planned.     Biafine over dry skin.  -----------------------------------  Eppie Gibson, MD

## 2016-04-05 ENCOUNTER — Ambulatory Visit: Payer: Medicaid Other | Attending: Family Medicine | Admitting: Family Medicine

## 2016-04-05 ENCOUNTER — Encounter: Payer: Self-pay | Admitting: Family Medicine

## 2016-04-05 ENCOUNTER — Ambulatory Visit
Admission: RE | Admit: 2016-04-05 | Discharge: 2016-04-05 | Disposition: A | Discharge status: Home / Self Care | Payer: Medicaid Other | Source: Ambulatory Visit | Attending: Radiation Oncology | Admitting: Radiation Oncology

## 2016-04-05 VITALS — BP 99/65 | HR 121 | Temp 99.0°F | Ht 70.0 in | Wt 143.6 lb

## 2016-04-05 DIAGNOSIS — C760 Malignant neoplasm of head, face and neck: Secondary | ICD-10-CM | POA: Diagnosis not present

## 2016-04-05 DIAGNOSIS — Z79899 Other long term (current) drug therapy: Secondary | ICD-10-CM | POA: Insufficient documentation

## 2016-04-05 DIAGNOSIS — R000 Tachycardia, unspecified: Secondary | ICD-10-CM | POA: Insufficient documentation

## 2016-04-05 DIAGNOSIS — R040 Epistaxis: Secondary | ICD-10-CM

## 2016-04-05 DIAGNOSIS — Z510 Encounter for antineoplastic radiation therapy: Secondary | ICD-10-CM | POA: Diagnosis not present

## 2016-04-05 DIAGNOSIS — D649 Anemia, unspecified: Secondary | ICD-10-CM | POA: Diagnosis not present

## 2016-04-05 DIAGNOSIS — C4442 Squamous cell carcinoma of skin of scalp and neck: Secondary | ICD-10-CM

## 2016-04-05 DIAGNOSIS — R110 Nausea: Secondary | ICD-10-CM | POA: Diagnosis not present

## 2016-04-05 MED ORDER — ONDANSETRON 4 MG PO TBDP: 4.0000 mg | ORAL_TABLET | Freq: Three times a day (TID) | ORAL | 1 refills | Status: DC | PRN

## 2016-04-05 NOTE — Progress Notes (Signed)
Subjective:  Patient ID: Johnny Mooney, male    DOB: 06-18-1970  Age: 46 y.o. MRN: [id number]  CC: neck cancer   HPI Johnny Mooney is a 46 year old male with a medical history of stage IV squamous cell carcinoma of the right posterior skull who comes into the clinic for follow-up visit. He currently undergoes radiation; was last seen by oncology yesterday at the cancer center and is scheduled for radiation today.  He reports improvement in his pain compared to his last visit as he is currently on Percocet which controls his pain better. He states he is in a good mood and has positive outlook to light. He points to sutures at the back of his head which have been in place since 10/2015 when he had his surgery but were never removed. He would like to have them removed once he has completed radiation.  Notices pisodes of nosebleeds which lasted for about 5 minutes but then resolved spontaneously. Denies picking his nose or postnasal drip or rhinorrhea but notices that on some occasions they happened when he blows his nose. Remains tachycardic; TSH from 02/2016 was normal. Tachycardia could be in response to his anemia.  Past Medical History:  Diagnosis Date  . Cancer ([place]) 11/2015   sq cell skin  . Cellulitis of left lower extremity    dx 09/ 2017 at ED took antibiotic per is healing  . History of traumatic head injury    closed -- no residual  . Subcutaneous mass of head    RIGHT POSTERIOR SCALP--  I&D at ED 09-16-2015 and 10-10-2015    Past Surgical History:  Procedure Laterality Date  . EXCISION MASS HEAD N/A 11/06/2015   Procedure: EXCISION MASS HEAD;  Surgeon: Mickeal Skinner, MD;  Location: [facility];  Service: General;  Laterality: N/A;  . LYMPH NODE BIOPSY N/A 01/07/2016   Procedure: EXCISIONAL LYMPH NODE BIOPSY;  Surgeon: [name], MD;  Location: [facility];  Service: General;  Laterality: N/A;  . MASS EXCISION N/A 01/07/2016   Procedure: EXCISION OF SCALP MASS;  Surgeon: [name], MD;  Location: [facility];  Service: General;  Laterality: N/A;    No Known Allergies   Outpatient Medications Prior to Visit  Medication Sig Dispense Refill  . feeding supplement, ENSURE ENLIVE, (ENSURE ENLIVE) LIQD Take 237 mLs by mouth 2 (two) times daily between meals. 237 mL 12  . lidocaine (XYLOCAINE) 2 % solution Pharmacy: Mix 50:50 with water.  Patient: Swish and swallow 60mL of this mixture, 92min before meals and at bedtime, up to QID. 100 mL 5  . magic mouthwash SOLN Take 5 mLs by mouth 3 (three) times daily as needed for mouth pain. 120 mL 0  . magnesium chloride (SLOW-MAG) 64 MG TBEC SR tablet Take 2 tablets (128 mg total) by mouth 2 (two) times daily. 60 tablet 0  . naproxen (NAPROSYN) 500 MG tablet Take 1 tablet (500 mg total) by mouth 2 (two) times daily with a meal. 60 tablet 0  . oxyCODONE-acetaminophen (PERCOCET/ROXICET) 5-325 MG tablet Take 1 tablet by mouth every 4 (four) hours as needed for severe pain. 36 tablet 0  . polyethylene glycol (MIRALAX / GLYCOLAX) packet Take 17 g by mouth daily. 14 each 0  . senna (SENOKOT) 8.6 MG TABS tablet Take 1 tablet (8.6 mg total) by mouth at bedtime. 60 each 1  . terbinafine (LAMISIL) 1 % cream Apply topically 2 (two) times daily. Use for up to 2  weeks. 30 g 0  . ondansetron (ZOFRAN-ODT) 4 MG disintegrating tablet Take 1 tablet (4 mg total) by mouth every 6 (six) hours as needed for nausea. (Patient not taking: Reported on 03/28/2016) 20 tablet 0   Facility-Administered Medications Prior to Visit  Medication Dose Route Frequency Provider Last Rate Last Dose  . HYDROcodone-acetaminophen (NORCO/VICODIN) 5-325 MG per tablet 1 tablet  1 tablet Oral Once Kyung Rudd, MD        ROS Review of Systems  Constitutional: Negative for activity change and appetite change.  HENT: Positive for nosebleeds. Negative for sinus pressure and sore throat.   Eyes: Negative for visual  disturbance.  Respiratory: Negative for cough, chest tightness and shortness of breath.   Cardiovascular: Negative for chest pain and leg swelling.  Gastrointestinal: Negative for abdominal distention, abdominal pain, constipation and diarrhea.  Endocrine: Negative.   Genitourinary: Negative for dysuria.  Musculoskeletal: Negative for joint swelling and myalgias.  Skin: Negative for rash.  Allergic/Immunologic: Negative.   Neurological: Negative for weakness, light-headedness and numbness.  Psychiatric/Behavioral: Negative for dysphoric mood and suicidal ideas.    Objective:  BP 99/65 (BP Location: Right Arm, Patient Position: Sitting, Cuff Size: Large)   Pulse (!) 121   Temp 99 F (37.2 C) (Oral)   Ht 5\' 10"  (1.778 m)   Wt 143 lb 9.6 oz (65.1 kg)   SpO2 97%   BMI 20.60 kg/m   BP/Weight 04/05/2016 07/13/5091 03/01/7122  Systolic BP 99 - 580  Diastolic BP 65 - 79  Wt. (Lbs) 143.6 141.4 140.2  BMI 20.6 20.29 20.12      Physical Exam Constitutional: He is oriented to person, place, and time.  Cardiovascular: Tachycardic rate, normal heart sounds and intact distal pulses.   No murmur heard. Pulmonary/Chest: Effort normal and breath sounds normal. He has no wheezes. He has no rales. He exhibits no tenderness.  Abdominal: Soft. Bowel sounds are normal. He exhibits no distension and no mass. There is no tenderness.  Musculoskeletal: Normal range of motion.  Neurological: He is alert and oriented to person, place, and time.  Skin:  Secure dressing on right side of occiput  Sutures slightly above dressing at the site of previous surgical incision  Psychiatric:  Normal   CMP Latest Ref Rng & Units 03/21/2016 03/16/2016 03/10/2016  Glucose 70 - 140 mg/dl 111 133(H) 128(H)  BUN 7.0 - 26.0 mg/dL 11.4 8 7   Creatinine 0.7 - 1.3 mg/dL 0.8 0.71 0.71  Sodium 136 - 145 mEq/L 135(L) 134(L) 135  Potassium 3.5 - 5.1 mEq/L 4.0 4.4 4.2  Chloride 98 - 110 mmol/L - 98 105  CO2 22 - 29 mEq/L 25  23 21(L)  Calcium 8.4 - 10.4 mg/dL 9.9 8.7 8.2(L)  Total Protein 6.4 - 8.3 g/dL 8.7(H) 6.8 7.0  Total Bilirubin 0.20 - 1.20 mg/dL 0.41 0.5 0.7  Alkaline Phos 40 - 150 U/L 275(H) 271(H) 249(H)  AST 5 - 34 U/L 24 14 48(H)  ALT 0 - 55 U/L 23 30 73(H)    CBC Latest Ref Rng & Units 03/21/2016 03/11/2016 03/10/2016  WBC 4.0 - 10.3 10e3/uL 26.1(H) 25.2(H) 20.6(H)  Hemoglobin 13.0 - 17.1 g/dL 8.3(L) 9.6(L) 7.3(L)  Hematocrit 38.4 - 49.9 % 24.8(L) 27.7(L) 21.3(L)  Platelets 140 - 400 10e3/uL 880(H) 583(H) 545(H)    Assessment & Plan:   1. Squamous cell carcinoma of neck Currently undergoing radiation He will return at his next visit for removal of the sutures once he is completely treated radiation as  he would not like to do it today due to pain  2. Chronic anemia Was transfused 2 weeks ago Could explain tachycardia - CBC with Differential/Platelet - COMPLETE METABOLIC PANEL WITH GFR  3. Nausea Radiation-induced - ondansetron (ZOFRAN-ODT) 4 MG disintegrating tablet; Take 1 tablet (4 mg total) by mouth every 8 (eight) hours as needed for nausea.  Dispense: 60 tablet; Refill: 1  4. Epistaxis Secondary to heating system versus URI I have explained to the patient measures to stop epistaxis.    Meds ordered this encounter  Medications  . ondansetron (ZOFRAN-ODT) 4 MG disintegrating tablet    Sig: Take 1 tablet (4 mg total) by mouth every 8 (eight) hours as needed for nausea.    Dispense:  60 tablet    Refill:  1    Follow-up: Return in about 3 weeks (around 04/26/2016) for Suture removal.   Arnoldo Morale MD

## 2016-04-05 NOTE — Progress Notes (Signed)
Refill- magnesium, zofran

## 2016-04-06 ENCOUNTER — Ambulatory Visit
Admission: RE | Admit: 2016-04-06 | Discharge: 2016-04-06 | Disposition: A | Discharge status: Home / Self Care | Payer: Medicaid Other | Source: Ambulatory Visit | Attending: Radiation Oncology | Admitting: Radiation Oncology

## 2016-04-06 DIAGNOSIS — Z510 Encounter for antineoplastic radiation therapy: Secondary | ICD-10-CM | POA: Diagnosis not present

## 2016-04-06 LAB — COMPLETE METABOLIC PANEL WITH GFR
ALK PHOS: 199 U/L — AB (ref 40–115)
ALT: 17 U/L (ref 9–46)
AST: 18 U/L (ref 10–40)
Albumin: 2.4 g/dL — ABNORMAL LOW (ref 3.6–5.1)
BUN: 6 mg/dL — AB (ref 7–25)
CO2: 24 mmol/L (ref 20–31)
Calcium: 8.7 mg/dL (ref 8.6–10.3)
Chloride: 94 mmol/L — ABNORMAL LOW (ref 98–110)
Creat: 0.56 mg/dL — ABNORMAL LOW (ref 0.60–1.35)
GFR, Est African American: >89 mL/min (ref >=60)
GLUCOSE: 89 mg/dL (ref 65–99)
POTASSIUM: 4.7 mmol/L (ref 3.5–5.3)
SODIUM: 132 mmol/L — AB (ref 135–146)
Total Bilirubin: 0.3 mg/dL (ref 0.2–1.2)
Total Protein: 6.7 g/dL (ref 6.1–8.1)

## 2016-04-06 LAB — CBC WITH DIFFERENTIAL/PLATELET
BASOS ABS: 0 {cells}/uL (ref 0–200)
Basophils Relative: 0 %
EOS PCT: 0 %
Eosinophils Absolute: 0 cells/uL — ABNORMAL LOW (ref 15–500)
HCT: 22.7 % — ABNORMAL LOW (ref 38.5–50.0)
Hemoglobin: 7 g/dL — ABNORMAL LOW (ref 13.2–17.1)
LYMPHS ABS: 430 {cells}/uL — AB (ref 850–3900)
Lymphocytes Relative: 2 %
MCH: 24.8 pg — AB (ref 27.0–33.0)
MCHC: 30 g/dL — AB (ref 32.0–36.0)
MCV: 80.5 fL (ref 80.0–100.0)
MONOS PCT: 8 %
MPV: 9.3 fL (ref 7.5–12.5)
Monocytes Absolute: 1720 cells/uL — ABNORMAL HIGH (ref 200–950)
NEUTROS PCT: 90 %
Neutro Abs: 19350 cells/uL — ABNORMAL HIGH (ref 1500–7800)
PLATELETS: 843 10*3/uL — AB (ref 140–400)
RBC: 2.82 MIL/uL — AB (ref 4.20–5.80)
RDW: 16.7 % — AB (ref 11.0–15.0)
WBC: 21.5 10*3/uL — AB (ref 3.8–10.8)

## 2016-04-07 ENCOUNTER — Ambulatory Visit
Admission: RE | Admit: 2016-04-07 | Discharge: 2016-04-07 | Disposition: A | Discharge status: Home / Self Care | Payer: Medicaid Other | Source: Ambulatory Visit | Attending: Radiation Oncology | Admitting: Radiation Oncology

## 2016-04-07 ENCOUNTER — Other Ambulatory Visit: Payer: Self-pay | Admitting: *Deleted

## 2016-04-07 ENCOUNTER — Ambulatory Visit: Payer: Medicaid Other | Admitting: Nutrition

## 2016-04-07 ENCOUNTER — Ambulatory Visit (HOSPITAL_COMMUNITY)
Admission: RE | Admit: 2016-04-07 | Discharge: 2016-04-07 | Disposition: A | Discharge status: Home / Self Care | Payer: Medicaid Other | Source: Ambulatory Visit | Attending: Radiation Oncology | Admitting: Radiation Oncology

## 2016-04-07 DIAGNOSIS — C4442 Squamous cell carcinoma of skin of scalp and neck: Secondary | ICD-10-CM

## 2016-04-07 LAB — PREPARE RBC (CROSSMATCH)

## 2016-04-07 NOTE — Progress Notes (Signed)
Nutrition follow-up completed with patient and his mother for radiation therapy for metastatic squamous cell carcinoma of the right posterior skin of scalp to the bilateral neck stage IV.  Weight improved documented as 143.6 pounds increased from 140.2 pounds March 5. Patient reports he does not feel well today. Patient still has not tried oral nutrition supplement shake for which I provided recipes, during one of our previous visits Patient's mother is supportive and offered to make patient anything he wants. Noted patient requires blood transfusion tomorrow.  Nutrition diagnosis: Severe malnutrition continues.  Intervention: I educated patient to try nutrition shakes for increased calories and protein. Provided support and encouragement. Provided additional samples of oral nutrition supplements. Teach back method used.  Monitoring, evaluation, goals: Patient will work to increase calories and protein to promote weight stabilization/weight gain.  Next visit: Thursday, March 22.  **Disclaimer: This note was dictated with voice recognition software. Similar sounding words can inadvertently be transcribed and this note may contain transcription errors which may not have been corrected upon publication of note.**

## 2016-04-08 ENCOUNTER — Inpatient Hospital Stay (HOSPITAL_COMMUNITY)
Admission: EM | Admit: 2016-04-08 | Discharge: 2016-04-18 | DRG: 435 | Disposition: A | Discharge status: Other Acute Inpatient Hospital | Payer: Medicaid Other | Attending: Family Medicine | Admitting: Family Medicine

## 2016-04-08 ENCOUNTER — Emergency Department (HOSPITAL_COMMUNITY): Payer: Medicaid Other

## 2016-04-08 ENCOUNTER — Ambulatory Visit
Admission: RE | Admit: 2016-04-08 | Discharge: 2016-04-08 | Disposition: A | Discharge status: Home / Self Care | Payer: Medicaid Other | Source: Ambulatory Visit | Attending: Radiation Oncology | Admitting: Radiation Oncology

## 2016-04-08 ENCOUNTER — Ambulatory Visit: Payer: Medicaid Other | Admitting: Hematology and Oncology

## 2016-04-08 ENCOUNTER — Other Ambulatory Visit: Payer: Self-pay

## 2016-04-08 ENCOUNTER — Ambulatory Visit (HOSPITAL_COMMUNITY)
Admission: RE | Admit: 2016-04-08 | Discharge: 2016-04-08 | Disposition: A | Discharge status: Home / Self Care | Payer: Medicaid Other | Source: Ambulatory Visit | Attending: Radiation Oncology | Admitting: Radiation Oncology

## 2016-04-08 ENCOUNTER — Encounter (HOSPITAL_COMMUNITY): Payer: Self-pay | Admitting: Emergency Medicine

## 2016-04-08 ENCOUNTER — Encounter: Payer: Self-pay | Admitting: *Deleted

## 2016-04-08 DIAGNOSIS — C7800 Secondary malignant neoplasm of unspecified lung: Secondary | ICD-10-CM | POA: Diagnosis not present

## 2016-04-08 DIAGNOSIS — R000 Tachycardia, unspecified: Secondary | ICD-10-CM | POA: Diagnosis present

## 2016-04-08 DIAGNOSIS — Z87820 Personal history of traumatic brain injury: Secondary | ICD-10-CM | POA: Diagnosis not present

## 2016-04-08 DIAGNOSIS — Z6820 Body mass index (BMI) 20.0-20.9, adult: Secondary | ICD-10-CM

## 2016-04-08 DIAGNOSIS — Z791 Long term (current) use of non-steroidal anti-inflammatories (NSAID): Secondary | ICD-10-CM

## 2016-04-08 DIAGNOSIS — B370 Candidal stomatitis: Secondary | ICD-10-CM | POA: Diagnosis not present

## 2016-04-08 DIAGNOSIS — D630 Anemia in neoplastic disease: Secondary | ICD-10-CM | POA: Diagnosis not present

## 2016-04-08 DIAGNOSIS — R071 Chest pain on breathing: Secondary | ICD-10-CM

## 2016-04-08 DIAGNOSIS — R509 Fever, unspecified: Secondary | ICD-10-CM | POA: Diagnosis present

## 2016-04-08 DIAGNOSIS — R059 Cough, unspecified: Secondary | ICD-10-CM

## 2016-04-08 DIAGNOSIS — R64 Cachexia: Secondary | ICD-10-CM

## 2016-04-08 DIAGNOSIS — C4442 Squamous cell carcinoma of skin of scalp and neck: Secondary | ICD-10-CM | POA: Insufficient documentation

## 2016-04-08 DIAGNOSIS — C779 Secondary and unspecified malignant neoplasm of lymph node, unspecified: Secondary | ICD-10-CM | POA: Diagnosis not present

## 2016-04-08 DIAGNOSIS — C787 Secondary malignant neoplasm of liver and intrahepatic bile duct: Principal | ICD-10-CM | POA: Diagnosis present

## 2016-04-08 DIAGNOSIS — C770 Secondary and unspecified malignant neoplasm of lymph nodes of head, face and neck: Secondary | ICD-10-CM | POA: Diagnosis not present

## 2016-04-08 DIAGNOSIS — R0789 Other chest pain: Secondary | ICD-10-CM | POA: Diagnosis not present

## 2016-04-08 DIAGNOSIS — K2900 Acute gastritis without bleeding: Secondary | ICD-10-CM | POA: Diagnosis not present

## 2016-04-08 DIAGNOSIS — C7802 Secondary malignant neoplasm of left lung: Secondary | ICD-10-CM | POA: Diagnosis present

## 2016-04-08 DIAGNOSIS — K2970 Gastritis, unspecified, without bleeding: Secondary | ICD-10-CM | POA: Diagnosis not present

## 2016-04-08 DIAGNOSIS — F17290 Nicotine dependence, other tobacco product, uncomplicated: Secondary | ICD-10-CM | POA: Diagnosis present

## 2016-04-08 DIAGNOSIS — C7801 Secondary malignant neoplasm of right lung: Secondary | ICD-10-CM | POA: Diagnosis not present

## 2016-04-08 DIAGNOSIS — K5909 Other constipation: Secondary | ICD-10-CM | POA: Diagnosis not present

## 2016-04-08 DIAGNOSIS — E43 Unspecified severe protein-calorie malnutrition: Secondary | ICD-10-CM | POA: Diagnosis present

## 2016-04-08 DIAGNOSIS — E871 Hypo-osmolality and hyponatremia: Secondary | ICD-10-CM | POA: Diagnosis present

## 2016-04-08 DIAGNOSIS — C4490 Unspecified malignant neoplasm of skin, unspecified: Secondary | ICD-10-CM

## 2016-04-08 DIAGNOSIS — F17200 Nicotine dependence, unspecified, uncomplicated: Secondary | ICD-10-CM | POA: Diagnosis present

## 2016-04-08 DIAGNOSIS — R05 Cough: Secondary | ICD-10-CM

## 2016-04-08 DIAGNOSIS — C792 Secondary malignant neoplasm of skin: Secondary | ICD-10-CM | POA: Diagnosis not present

## 2016-04-08 DIAGNOSIS — D649 Anemia, unspecified: Secondary | ICD-10-CM | POA: Insufficient documentation

## 2016-04-08 DIAGNOSIS — F329 Major depressive disorder, single episode, unspecified: Secondary | ICD-10-CM | POA: Diagnosis not present

## 2016-04-08 DIAGNOSIS — E46 Unspecified protein-calorie malnutrition: Secondary | ICD-10-CM | POA: Diagnosis not present

## 2016-04-08 DIAGNOSIS — C760 Malignant neoplasm of head, face and neck: Secondary | ICD-10-CM | POA: Diagnosis present

## 2016-04-08 DIAGNOSIS — L03221 Cellulitis of neck: Secondary | ICD-10-CM | POA: Diagnosis not present

## 2016-04-08 DIAGNOSIS — R110 Nausea: Secondary | ICD-10-CM

## 2016-04-08 DIAGNOSIS — D72829 Elevated white blood cell count, unspecified: Secondary | ICD-10-CM | POA: Diagnosis present

## 2016-04-08 DIAGNOSIS — K750 Abscess of liver: Secondary | ICD-10-CM

## 2016-04-08 LAB — CBC WITH DIFFERENTIAL/PLATELET
BASOS ABS: 0 10*3/uL (ref 0.0–0.1)
Basophils Relative: 0 %
EOS ABS: 0 10*3/uL (ref 0.0–0.7)
Eosinophils Relative: 0 %
HEMATOCRIT: 21 % — AB (ref 39.0–52.0)
Hemoglobin: 7.1 g/dL — ABNORMAL LOW (ref 13.0–17.0)
LYMPHS ABS: 1.5 10*3/uL (ref 0.7–4.0)
Lymphocytes Relative: 5 %
MCH: 26.6 pg (ref 26.0–34.0)
MCHC: 33.8 g/dL (ref 30.0–36.0)
MCV: 78.7 fL (ref 78.0–100.0)
MONOS PCT: 5 %
Monocytes Absolute: 1.5 10*3/uL — ABNORMAL HIGH (ref 0.1–1.0)
NEUTROS ABS: 27.7 10*3/uL — AB (ref 1.7–7.7)
Neutrophils Relative %: 90 %
Platelets: 623 10*3/uL — ABNORMAL HIGH (ref 150–400)
RBC: 2.67 MIL/uL — ABNORMAL LOW (ref 4.22–5.81)
RDW: 17.5 % — AB (ref 11.5–15.5)
WBC: 30.7 10*3/uL — ABNORMAL HIGH (ref 4.0–10.5)

## 2016-04-08 LAB — URINALYSIS, ROUTINE W REFLEX MICROSCOPIC
Bilirubin Urine: NEGATIVE
Glucose, UA: NEGATIVE mg/dL
Hgb urine dipstick: NEGATIVE
Ketones, ur: NEGATIVE mg/dL
LEUKOCYTES UA: NEGATIVE
NITRITE: NEGATIVE
Protein, ur: NEGATIVE mg/dL
pH: 7 (ref 5.0–8.0)

## 2016-04-08 LAB — I-STAT CHEM 8, ED
BUN: 4 mg/dL — ABNORMAL LOW (ref 6–20)
CALCIUM ION: 1.18 mmol/L (ref 1.15–1.40)
CREATININE: 0.5 mg/dL — AB (ref 0.61–1.24)
Chloride: 93 mmol/L — ABNORMAL LOW (ref 101–111)
GLUCOSE: 119 mg/dL — AB (ref 65–99)
HCT: 18 % — ABNORMAL LOW (ref 39.0–52.0)
HEMOGLOBIN: 6.1 g/dL — AB (ref 13.0–17.0)
Potassium: 4 mmol/L (ref 3.5–5.1)
Sodium: 130 mmol/L — ABNORMAL LOW (ref 135–145)
TCO2: 27 mmol/L (ref 0–100)

## 2016-04-08 LAB — I-STAT TROPONIN, ED
Troponin i, poc: 0.01 ng/mL (ref 0.00–0.08)
Troponin i, poc: 0.02 ng/mL (ref 0.00–0.08)

## 2016-04-08 LAB — BASIC METABOLIC PANEL
Anion gap: 8 (ref 5–15)
BUN: 7 mg/dL (ref 6–20)
CALCIUM: 9 mg/dL (ref 8.9–10.3)
CO2: 27 mmol/L (ref 22–32)
CREATININE: 0.55 mg/dL — AB (ref 0.61–1.24)
Chloride: 94 mmol/L — ABNORMAL LOW (ref 101–111)
GFR calc non Af Amer: >60 mL/min (ref >60)
GLUCOSE: 114 mg/dL — AB (ref 65–99)
Potassium: 4 mmol/L (ref 3.5–5.1)
Sodium: 129 mmol/L — ABNORMAL LOW (ref 135–145)

## 2016-04-08 LAB — BRAIN NATRIURETIC PEPTIDE: B Natriuretic Peptide: 65.2 pg/mL (ref 0.0–100.0)

## 2016-04-08 LAB — I-STAT CG4 LACTIC ACID, ED: Lactic Acid, Venous: 0.98 mmol/L (ref 0.5–1.9)

## 2016-04-08 MED ORDER — HEPARIN SOD (PORK) LOCK FLUSH 100 UNIT/ML IV SOLN: 500.0000 [IU] | Freq: Every day | INTRAVENOUS | Status: DC | PRN

## 2016-04-08 MED ORDER — SODIUM CHLORIDE 0.9% FLUSH: 3.0000 mL | INTRAVENOUS | Status: DC | PRN

## 2016-04-08 MED ORDER — ONDANSETRON HCL 4 MG/2ML IJ SOLN
4.0000 mg | Freq: Once | INTRAMUSCULAR | Status: AC
  Administered 2016-04-08: 4 mg via INTRAVENOUS
  Filled 2016-04-08: qty 2

## 2016-04-08 MED ORDER — ONDANSETRON HCL 4 MG/2ML IJ SOLN
4.0000 mg | Freq: Four times a day (QID) | INTRAMUSCULAR | Status: DC | PRN
  Administered 2016-04-17: 4 mg via INTRAVENOUS
  Filled 2016-04-08: qty 2

## 2016-04-08 MED ORDER — ENOXAPARIN SODIUM 40 MG/0.4ML ~~LOC~~ SOLN
40.0000 mg | Freq: Every day | SUBCUTANEOUS | Status: DC
  Filled 2016-04-08: qty 0.4

## 2016-04-08 MED ORDER — SODIUM CHLORIDE 0.9% FLUSH
3.0000 mL | Freq: Two times a day (BID) | INTRAVENOUS | Status: DC
  Administered 2016-04-09 – 2016-04-18 (×13): 3 mL via INTRAVENOUS

## 2016-04-08 MED ORDER — OXYCODONE-ACETAMINOPHEN 5-325 MG PO TABS
1.0000 | ORAL_TABLET | ORAL | Status: DC | PRN
  Administered 2016-04-09 – 2016-04-17 (×11): 1 via ORAL
  Filled 2016-04-08 (×14): qty 1

## 2016-04-08 MED ORDER — SENNA 8.6 MG PO TABS
1.0000 | ORAL_TABLET | Freq: Every day | ORAL | Status: DC
  Administered 2016-04-09 – 2016-04-17 (×10): 8.6 mg via ORAL
  Filled 2016-04-08 (×10): qty 1

## 2016-04-08 MED ORDER — SODIUM CHLORIDE 0.9 % IV SOLN
INTRAVENOUS | Status: DC
  Administered 2016-04-09 (×2): via INTRAVENOUS
  Administered 2016-04-10 – 2016-04-11 (×3): 75 mL/h via INTRAVENOUS

## 2016-04-08 MED ORDER — SODIUM CHLORIDE 0.9 % IV BOLUS (SEPSIS)
1000.0000 mL | Freq: Once | INTRAVENOUS | Status: AC
  Administered 2016-04-09: 1000 mL via INTRAVENOUS

## 2016-04-08 MED ORDER — VANCOMYCIN HCL 10 G IV SOLR
1250.0000 mg | Freq: Once | INTRAVENOUS | Status: AC
  Administered 2016-04-09: 1250 mg via INTRAVENOUS
  Filled 2016-04-08: qty 1250

## 2016-04-08 MED ORDER — SODIUM CHLORIDE 0.9 % IV BOLUS (SEPSIS)
1000.0000 mL | Freq: Once | INTRAVENOUS | Status: AC
  Administered 2016-04-08: 1000 mL via INTRAVENOUS

## 2016-04-08 MED ORDER — DIPHENHYDRAMINE HCL 25 MG PO CAPS
25.0000 mg | ORAL_CAPSULE | Freq: Once | ORAL | Status: AC
  Administered 2016-04-08: 25 mg via ORAL
  Filled 2016-04-08: qty 1

## 2016-04-08 MED ORDER — OXYCODONE-ACETAMINOPHEN 5-325 MG PO TABS
1.0000 | ORAL_TABLET | Freq: Once | ORAL | Status: AC
  Administered 2016-04-08: 1 via ORAL
  Filled 2016-04-08: qty 1

## 2016-04-08 MED ORDER — ONDANSETRON HCL 4 MG/2ML IJ SOLN: 4.0000 mg | Freq: Once | INTRAMUSCULAR | Status: DC

## 2016-04-08 MED ORDER — ACETAMINOPHEN 500 MG PO TABS: 1000.0000 mg | ORAL_TABLET | Freq: Once | ORAL | Status: DC

## 2016-04-08 MED ORDER — MORPHINE SULFATE (PF) 4 MG/ML IV SOLN: 4.0000 mg | Freq: Once | INTRAVENOUS | Status: DC

## 2016-04-08 MED ORDER — IOPAMIDOL (ISOVUE-370) INJECTION 76%
100.0000 mL | Freq: Once | INTRAVENOUS | Status: AC | PRN
  Administered 2016-04-08: 100 mL via INTRAVENOUS

## 2016-04-08 MED ORDER — POLYETHYLENE GLYCOL 3350 17 G PO PACK
17.0000 g | PACK | Freq: Every day | ORAL | Status: DC
  Administered 2016-04-10 – 2016-04-18 (×6): 17 g via ORAL
  Filled 2016-04-08 (×8): qty 1

## 2016-04-08 MED ORDER — IOPAMIDOL (ISOVUE-370) INJECTION 76%
INTRAVENOUS | Status: AC
  Filled 2016-04-08: qty 100

## 2016-04-08 MED ORDER — SODIUM CHLORIDE 0.9 % IV SOLN
250.0000 mL | Freq: Once | INTRAVENOUS | Status: AC
  Administered 2016-04-08: 250 mL via INTRAVENOUS

## 2016-04-08 MED ORDER — NAPROXEN 500 MG PO TABS
500.0000 mg | ORAL_TABLET | Freq: Two times a day (BID) | ORAL | Status: DC
  Administered 2016-04-09 – 2016-04-12 (×6): 500 mg via ORAL
  Filled 2016-04-08 (×7): qty 1

## 2016-04-08 MED ORDER — ONDANSETRON HCL 4 MG PO TABS: 4.0000 mg | ORAL_TABLET | Freq: Four times a day (QID) | ORAL | Status: DC | PRN

## 2016-04-08 MED ORDER — SODIUM CHLORIDE 0.9% FLUSH: 10.0000 mL | INTRAVENOUS | Status: DC | PRN

## 2016-04-08 MED ORDER — MAGNESIUM CHLORIDE 64 MG PO TBEC
2.0000 | DELAYED_RELEASE_TABLET | Freq: Two times a day (BID) | ORAL | Status: DC
  Administered 2016-04-09 – 2016-04-18 (×18): 128 mg via ORAL
  Filled 2016-04-08 (×21): qty 2

## 2016-04-08 MED ORDER — PIPERACILLIN-TAZOBACTAM 3.375 G IVPB 30 MIN
3.3750 g | Freq: Once | INTRAVENOUS | Status: AC
  Administered 2016-04-09: 3.375 g via INTRAVENOUS
  Filled 2016-04-08: qty 50

## 2016-04-08 MED ORDER — HEPARIN SOD (PORK) LOCK FLUSH 100 UNIT/ML IV SOLN: 250.0000 [IU] | INTRAVENOUS | Status: DC | PRN

## 2016-04-08 MED ORDER — ACETAMINOPHEN 325 MG PO TABS
650.0000 mg | ORAL_TABLET | Freq: Once | ORAL | Status: AC
  Administered 2016-04-08: 650 mg via ORAL
  Filled 2016-04-08: qty 2

## 2016-04-08 MED ORDER — HYDROMORPHONE HCL 1 MG/ML IJ SOLN
1.0000 mg | Freq: Once | INTRAMUSCULAR | Status: DC
  Filled 2016-04-08: qty 1

## 2016-04-08 NOTE — Progress Notes (Addendum)
Pt's temp was noted to be 101.9 pre blood vitals. [place] notified and spoke with Gayleen Orem nurse navigator and Dr. Lanell Persons. Will notify Dr.Amao as ordered by Dr. Lanell Persons and give the pre med of Tylenol and give blood when temp goes down.

## 2016-04-08 NOTE — ED Notes (Signed)
Patient transported to X-ray 

## 2016-04-08 NOTE — Progress Notes (Signed)
[name] request the patient be taken to the ER. Phoned Patty, RN in ER and provided reports. Patty request the patient be brought to room 17 in thirty minutes. Informed Gayleen Orem, RN of this finding.

## 2016-04-08 NOTE — ED Notes (Signed)
Patient transported to CT 

## 2016-04-08 NOTE — ED Triage Notes (Addendum)
Patient from cancer center-reports chest pain which began this morning.  Reports pain on inspiration.  Patient with hx of stage 3 squamous cell carcinoma.

## 2016-04-08 NOTE — Progress Notes (Addendum)
1630 Over for daily  dressing change to back of head done by Gayleen Orem, Rn and assisted by Guadelupe Sabin, Rn. 1640 Complained of having chest pain and discomfort stated he had it earlier today he never said the actual pain level.  Received a blood transfusion earlier today. BP 116/73   Pulse (!) 117   Temp (!) 101.6 F (38.7 C) (Oral)   Resp 16   SpO2 98%   1642 Spoke with [name] to report the situation.   Asked that Mr. Beutler be evaluated in the Emergency room. 1645  Report called to Patty Rn in the Emergency room by  Joaquim Lai, Rn.   Mr. Hence can go directly to room 17 in 30 minutes. [place] Mr. Perleberg to the Emergency room in a wheelchair by Gayleen Orem, Rn with his mother Mrs. Santor who is aware of the need for the Emergency room visit.

## 2016-04-08 NOTE — Progress Notes (Signed)
Dr. Johna Sheriff office notified of elevated temp. Waiting on a call back.

## 2016-04-08 NOTE — Progress Notes (Signed)
Oncology Nurse Navigator Documentation  Arrived Mr. Kirk to [facility] ED Room 17 per previous arrangement, provided report to attending MD.  Gayleen Orem, RN, BSN, [facility] [phone]

## 2016-04-08 NOTE — ED Provider Notes (Signed)
[facility] DEPT Provider Note   CSN: [id number] Arrival date & time: 04/08/16  1721     History   Chief Complaint Chief Complaint  Patient presents with  . Chest Pain  . Cancer    HPI Johnny Mooney is a 46 y.o. male.  46 yo M with a chief complaint of chest pain. Described as sharp and pleuritic. Bilateral. Worse with deep breathing. Patient is also been having some very mild cough and a fever. Started this morning. Required a transfusion today and had 2 units of packed red blood cells. Was noted to have a fever during the infusion. Patient had some improvement of his fever but started having worsening of his chest pain. Was then brought to the ED. Patient denies history of PE or DVT. Denies lower extremity edema.   The history is provided by the patient, the spouse and a caregiver.  Chest Pain   This is a new problem. The current episode started yesterday. The problem occurs constantly. The problem has not changed since onset.The pain is present in the substernal region. The pain is at a severity of 9/10. The pain is severe. The quality of the pain is described as pleuritic and sharp. The pain does not radiate. Duration of episode(s) is 1 day. Associated symptoms include a fever and shortness of breath. Pertinent negatives include no abdominal pain, no headaches, no palpitations and no vomiting. He has tried nothing for the symptoms. The treatment provided no relief.    Past Medical History:  Diagnosis Date  . Cancer ([place]) 11/2015   sq cell skin  . Cellulitis of left lower extremity    dx 09/ 2017 at ED took antibiotic per is healing  . History of traumatic head injury    closed -- no residual  . Subcutaneous mass of head    RIGHT POSTERIOR SCALP--  I&D at ED 09-16-2015 and 10-10-2015    Patient Active Problem List   Diagnosis Date Noted  . Fever of undetermined origin 04/08/2016  . Protein-calorie malnutrition, severe 03/11/2016  . Chronic anemia 03/10/2016  .  Headache 03/10/2016  . Tinea pedis 03/03/2016  . Malnutrition of moderate degree 02/27/2016  . Sepsis ([place]) 02/27/2016  . Fever 02/27/2016  . Hypercalcemia of malignancy 02/25/2016  . Hypercalcemia 02/25/2016  . AKI (acute kidney injury) ([place]) 02/25/2016  . 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 Tobacco abuse 02/20/2012    Past Surgical History:  Procedure Laterality Date  . EXCISION MASS HEAD N/A 11/06/2015   Procedure: EXCISION MASS HEAD;  Surgeon: Mickeal Skinner, MD;  Location: [facility];  Service: General;  Laterality: N/A;  . LYMPH NODE BIOPSY N/A 01/07/2016   Procedure: EXCISIONAL LYMPH NODE BIOPSY;  Surgeon: [name], MD;  Location: [facility];  Service: General;  Laterality: N/A;  . MASS EXCISION N/A 01/07/2016   Procedure: EXCISION OF SCALP MASS;  Surgeon: [name], MD;  Location: [facility];  Service: General;  Laterality: N/A;       Home Medications    Prior to Admission medications   Medication Sig Start Date End Date Taking? Authorizing Provider  lidocaine (XYLOCAINE) 2 % solution Pharmacy: Mix 50:50 with water.  Patient: Swish and swallow 57mL of this mixture, 85min before meals and at bedtime, up to QID. 03/21/16  Yes Eppie Gibson, MD  magic mouthwash SOLN Take 5 mLs by mouth 3 (three) times daily as needed for mouth pain. 03/07/16  Yes Mariel Aloe, MD  magnesium chloride (SLOW-MAG) 64 MG TBEC SR tablet Take 2 tablets (128 mg total) by mouth 2 (two) times daily. 03/28/16  Yes Eppie Gibson, MD  naproxen (NAPROSYN) 500 MG tablet Take 1 tablet (500 mg total) by mouth 2 (two) times daily with a meal. 03/28/16  Yes Eppie Gibson, MD  ondansetron (ZOFRAN-ODT) 4 MG disintegrating tablet Take 1 tablet (4 mg total) by mouth every 8 (eight) hours as needed for nausea. 04/05/16  Yes Arnoldo Morale, MD  oxyCODONE-acetaminophen (PERCOCET/ROXICET) 5-325 MG tablet Take 1 tablet by mouth every 4 (four) hours as needed for severe pain. 03/28/16   Yes Eppie Gibson, MD  polyethylene glycol [facility] / GLYCOLAX) packet Take 17 g by mouth daily. 03/07/16  Yes Mariel Aloe, MD  senna (SENOKOT) 8.6 MG TABS tablet Take 1 tablet (8.6 mg total) by mouth at bedtime. 03/28/16  Yes Eppie Gibson, MD  terbinafine (LAMISIL) 1 % cream Apply topically 2 (two) times daily. Use for up to 2 weeks. 03/07/16  Yes Mariel Aloe, MD  feeding supplement, ENSURE ENLIVE, (ENSURE ENLIVE) LIQD Take 237 mLs by mouth 2 (two) times daily between meals. 03/08/16   Mariel Aloe, MD    Family History Family History  Problem Relation Age of Onset  . Diabetes Mellitus II Mother   . Asthma Father     Social History Social History  Substance Use Topics  . Smoking status: Current Every Day Smoker    Packs/day: 0.33    Years: 5.00    Types: Cigars  . Smokeless tobacco: Never Used     Comment: quit cigarette smoking 2013, currently smokes 2 cigars daily  . Alcohol use No     Allergies   Patient has no known allergies.   Review of Systems Review of Systems  Constitutional: Positive for chills and fever.  HENT: Positive for congestion. Negative for facial swelling.   Eyes: Negative for discharge and visual disturbance.  Respiratory: Positive for shortness of breath.   Cardiovascular: Positive for chest pain. Negative for palpitations.  Gastrointestinal: Negative for abdominal pain, diarrhea and vomiting.  Musculoskeletal: Negative for arthralgias and myalgias.  Skin: Negative for color change and rash.  Neurological: Negative for tremors, syncope and headaches.  Psychiatric/Behavioral: Negative for confusion and dysphoric mood.     Physical Exam Updated Vital Signs BP 113/79 (BP Location: Right Arm)   Pulse (!) 102   Temp 100 F (37.8 C) (Oral)   Resp 16   Ht 5\' 10"  (1.778 m)   Wt 143 lb (64.9 kg)   SpO2 98%   BMI 20.52 kg/m   Physical Exam  Constitutional: He is oriented to person, place, and time. He appears well-developed and  well-nourished.  HENT:  Head: Normocephalic and atraumatic.  Eyes: EOM are normal. Pupils are equal, round, and reactive to light.  Neck: Normal range of motion. Neck supple. No JVD present.  Cardiovascular: Regular rhythm.  Tachycardia present.  Exam reveals no gallop and no friction rub.   No murmur heard. Pulmonary/Chest: No respiratory distress. He has no wheezes. He exhibits no tenderness.  Abdominal: He exhibits no distension and no mass. There is no tenderness. There is no rebound and no guarding.  Musculoskeletal: Normal range of motion.  Neurological: He is alert and oriented to person, place, and time.  Skin: No rash noted. No pallor.  Psychiatric: He has a normal mood and affect. His behavior is normal.  Nursing note and vitals reviewed.    ED Treatments / Results  Labs (all labs ordered are listed, but only abnormal results are displayed) Labs Reviewed  CBC WITH DIFFERENTIAL/PLATELET - Abnormal; Notable for the following:       Result Value   WBC 30.7 (*)    RBC 2.67 (*)    Hemoglobin 7.1 (*)    HCT 21.0 (*)    RDW 17.5 (*)    Platelets 623 (*)    Neutro Abs 27.7 (*)    Monocytes Absolute 1.5 (*)    All other components within normal limits  BASIC METABOLIC PANEL - Abnormal; Notable for the following:    Sodium 129 (*)    Chloride 94 (*)    Glucose, Bld 114 (*)    Creatinine, Ser 0.55 (*)    All other components within normal limits  I-STAT CHEM 8, ED - Abnormal; Notable for the following:    Sodium 130 (*)    Chloride 93 (*)    BUN 4 (*)    Creatinine, Ser 0.50 (*)    Glucose, Bld 119 (*)    Hemoglobin 6.1 (*)    HCT 18.0 (*)    All other components within normal limits  URINE CULTURE  CULTURE, BLOOD (ROUTINE X 2)  CULTURE, BLOOD (ROUTINE X 2)  BRAIN NATRIURETIC PEPTIDE  URINALYSIS, ROUTINE W REFLEX MICROSCOPIC  INFLUENZA PANEL BY PCR (TYPE A & B)  I-STAT TROPOININ, ED  I-STAT TROPOININ, ED  I-STAT CG4 LACTIC ACID, ED    EKG  EKG  Interpretation None       Radiology Dg Chest 2 View  Result Date: 04/08/2016 CLINICAL DATA:  Chest pain EXAM: CHEST  2 VIEW COMPARISON:  02/27/2016 FINDINGS: The heart size and mediastinal contours are within normal limits. Both lungs are clear. The visualized skeletal structures are unremarkable. Probable linear artifacts on the lateral view posteriorly. IMPRESSION: No active cardiopulmonary disease. Electronically Signed   By: Donavan Foil M.D.   On: 04/08/2016 18:24   Ct Angio Chest Pe W And/or Wo Contrast  Result Date: 04/08/2016 CLINICAL DATA:  46 year old male with chest pain. Evaluate for PE. History of squamous cell carcinoma of the skin. EXAM: CT ANGIOGRAPHY CHEST WITH CONTRAST TECHNIQUE: Multidetector CT imaging of the chest was performed using the standard protocol during bolus administration of intravenous contrast. Multiplanar CT image reconstructions and MIPs were obtained to evaluate the vascular anatomy. CONTRAST:  100 cc Isovue 370 COMPARISON:  Chest radiograph dated 04/08/2016 FINDINGS: Cardiovascular: There is no cardiomegaly or pericardial effusion. The thoracic aorta appears unremarkable. The origins of the great vessels of the aortic arch appear patent. There is mild dilatation of the main pulmonary trunk suggestive of mild underlying pulmonary hypertension. There is no CT evidence of pulmonary embolism. Mediastinum/Nodes: There is no hilar or mediastinal adenopathy. There is mild thickened appearance of the esophagus which may be related to underdistention. Clinical correlation is recommended to evaluate for esophagitis. The thyroid gland is grossly unremarkable. Lungs/Pleura: There are multiple small scattered bilateral pulmonary nodules measuring up to 6 mm in the left lower lobe (series 11, image 45). There is no focal consolidation, pleural effusion, or pneumothorax. The central airways are patent. Upper Abdomen: Multiple low attenuating liver lesions noted measuring up to 18  x 16 mm compatible with metastatic disease in this patient with known metastatic squamous cell carcinoma. The visualized upper abdomen is otherwise unremarkable. Musculoskeletal: There is loss of subcutaneous fat. The osseous structures are intact. Review of the MIP images confirms the above findings. IMPRESSION: 1. No CT evidence of  pulmonary embolism. 2. Multiple small scattered bilateral pulmonary nodules measuring up to 6 mm. These nodules may be reactive or represent metastatic disease. 3. Mildly prominent central pulmonary arteries suggestive of underlying pulmonary hypertension. 4. Multiple hepatic hypodense lesions most consistent with metastatic disease. Electronically Signed   By: Anner Crete M.D.   On: 04/08/2016 21:01    Procedures Procedures (including critical care time)  Medications Ordered in ED Medications  HYDROmorphone (DILAUDID) injection 1 mg (not administered)  vancomycin (VANCOCIN) 1,250 mg in sodium chloride 0.9 % 250 mL IVPB (not administered)  piperacillin-tazobactam (ZOSYN) IVPB 3.375 g (not administered)  oxyCODONE-acetaminophen (PERCOCET/ROXICET) 5-325 MG per tablet 1 tablet (1 tablet Oral Given 04/08/16 1735)  iopamidol (ISOVUE-370) 76 % injection 100 mL (100 mLs Intravenous Contrast Given 04/08/16 2025)  ondansetron (ZOFRAN) injection 4 mg (4 mg Intravenous Given 04/08/16 2231)  sodium chloride 0.9 % bolus 1,000 mL (1,000 mLs Intravenous New Bag/Given 04/08/16 2231)     Initial Impression / Assessment and Plan / ED Course  I have reviewed the triage vital signs and the nursing notes.  Pertinent labs & imaging results that were available during my care of the patient were reviewed by me and considered in my medical decision making (see chart for details).     46 yo M With a chief complaint of chest pain. Started this morning associated with fever. Patient has a history of cancer is getting radiation therapy of the neck. With fever will obtain xray eval for pna.     Chest x-ray is negative. A CT angios the chest performed to rule out PE. This is also negative for PE or pneumonia. The patient's hemoglobin is 7.1 today. It is odd because he was transfused 2 units for a 7 hemoglobin. I discussed that with radiation oncology, Dr. Sondra Come. He is not suspect that radiation therapy would cause this severe anemia. He recommended observation in the hospital. Patient still having severe pain in the chest or back.  The patients results and plan were reviewed and discussed.   Any x-rays performed were independently reviewed by myself.   Differential diagnosis were considered with the presenting HPI.  Medications  HYDROmorphone (DILAUDID) injection 1 mg (not administered)  vancomycin (VANCOCIN) 1,250 mg in sodium chloride 0.9 % 250 mL IVPB (not administered)  piperacillin-tazobactam (ZOSYN) IVPB 3.375 g (not administered)  oxyCODONE-acetaminophen (PERCOCET/ROXICET) 5-325 MG per tablet 1 tablet (1 tablet Oral Given 04/08/16 1735)  iopamidol (ISOVUE-370) 76 % injection 100 mL (100 mLs Intravenous Contrast Given 04/08/16 2025)  ondansetron (ZOFRAN) injection 4 mg (4 mg Intravenous Given 04/08/16 2231)  sodium chloride 0.9 % bolus 1,000 mL (1,000 mLs Intravenous New Bag/Given 04/08/16 2231)    Vitals:   04/08/16 1830 04/08/16 1900 04/08/16 2111 04/08/16 2257  BP: 107/68 109/68 108/69 113/79  Pulse: (!) 110 (!) 104 (!) 103 (!) 102  Resp: 14 13 14 16   Temp:   99.1 F (37.3 C) 100 F (37.8 C)  TempSrc:   Oral Oral  SpO2: 97% 93% 99% 98%  Weight:      Height:        Final diagnoses:  Chest pain on breathing  Symptomatic anemia    Admission/ observation were discussed with the admitting physician, patient and/or family and they are comfortable with the plan.    Final Clinical Impressions(s) / ED Diagnoses   Final diagnoses:  Chest pain on breathing  Symptomatic anemia    New Prescriptions New Prescriptions   No medications on file  Deno Etienne,  DO 04/08/16 2314

## 2016-04-08 NOTE — H&P (Signed)
History and Physical    CARLISS QUAST HEN:[id number] DOB: 27-Aug-1970 DOA: 04/08/2016  PCP: Arnoldo Morale, MD   Patient coming from: Cancer center.  I have personally briefly reviewed patient's old medical records in [place]  Chief Complaint: Chest pain.  HPI: Johnny Mooney is a 46 y.o. 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 He denies dyspnea, dizziness, diaphoresis, PND, orthopnea or pitting edema of the lower extremities.  Earlier today, the patient was receiving a blood transfusion ordered by oncology and developed a fever. His mother stated that he has been having low-grade fevers at home for the past few days. He complains of nasal congestion, he has chronic sore throat from radiation therapy, but denies rhinorrhea, dyspnea, productive cough, abdominal pain, nausea, emesis, diarrhea, dysuria or frequency. The patient has frequent constipation due to taking iron supplementations and opiate analgesics.  ED Course: The patient received normal saline 1000 mL bolus, hydromorphone 1 mg IVP, vancomycin and Zosyn emergency department. His WBC was 30.7 which is elevated from the patient's baseline which ranges from 20-24,000. His hemoglobin is 7.1 after receiving 2 units of packed RBCs earlier today. Platelets are 623, BNP 65.2 pg/mL, troponin 0.02. His sodium is 129, potassium 4.0, chloride 94, bicarbonate 27 and lactic acid 0.98 mmol/L. BUN was 7, creatinine 0.55 and glucose 114 mg/dL.  Imaging: Chest radiograph and CT of the chest angiogram did not reveal any acute pathology. However, there is evidence of metastatic disease. Please see full radiology report for further detail.   Review  of Systems: As per HPI otherwise 10 point review of systems negative.    Past Medical History:  Diagnosis Date  . Cancer ([place]) 11/2015   sq cell skin  . Cellulitis of left lower extremity    dx 09/ 2017 at ED took antibiotic per is healing  . History of traumatic head injury    closed -- no residual  . Subcutaneous mass of head    RIGHT POSTERIOR SCALP--  I&D at ED 09-16-2015 and 10-10-2015    Past Surgical History:  Procedure Laterality Date  . EXCISION MASS HEAD N/A 11/06/2015   Procedure: EXCISION MASS HEAD;  Surgeon: Mickeal Skinner, MD;  Location: [facility];  Service: General;  Laterality: N/A;  . LYMPH NODE BIOPSY N/A 01/07/2016   Procedure: EXCISIONAL LYMPH NODE BIOPSY;  Surgeon: [name], MD;  Location: [facility];  Service: General;  Laterality: N/A;  . MASS EXCISION N/A 01/07/2016   Procedure: EXCISION OF SCALP MASS;  Surgeon: [name], MD;  Location: [facility];  Service: General;  Laterality: N/A;     reports that he has been smoking Cigars.  He has a 1.65 pack-year smoking history. He has never used smokeless tobacco. He reports that he does not drink alcohol or use drugs.  No Known Allergies  Family History  Problem Relation Age of Onset  . Diabetes Mellitus II Mother   . Asthma Father     Prior to Admission medications   Medication Sig Start Date End Date Taking? Authorizing Provider  lidocaine (XYLOCAINE) 2 % solution Pharmacy: Mix 50:50 with water.  Patient: Swish and swallow 69mL of this mixture, 69min before meals and at bedtime, up to QID. 03/21/16  Yes Eppie Gibson, MD  magic mouthwash SOLN Take 5 mLs by mouth 3 (three) times daily as needed for mouth pain. 03/07/16  Yes Mariel Aloe, MD  magnesium chloride (SLOW-MAG) 64 MG TBEC SR tablet Take 2 tablets (128 mg total) by mouth 2 (two) times daily. 03/28/16  Yes Eppie Gibson, MD  naproxen (NAPROSYN) 500 MG tablet Take 1 tablet (500 mg total) by mouth 2 (two) times daily  with a meal. 03/28/16  Yes Eppie Gibson, MD  ondansetron (ZOFRAN-ODT) 4 MG disintegrating tablet Take 1 tablet (4 mg total) by mouth every 8 (eight) hours as needed for nausea. 04/05/16  Yes Arnoldo Morale, MD  oxyCODONE-acetaminophen (PERCOCET/ROXICET) 5-325 MG tablet Take 1 tablet by mouth every 4 (four) hours as needed for severe pain. 03/28/16  Yes Eppie Gibson, MD  polyethylene glycol [facility] / GLYCOLAX) packet Take 17 g by mouth daily. 03/07/16  Yes Mariel Aloe, MD  senna (SENOKOT) 8.6 MG TABS tablet Take 1 tablet (8.6 mg total) by mouth at bedtime. 03/28/16  Yes Eppie Gibson, MD  terbinafine (LAMISIL) 1 % cream Apply topically 2 (two) times daily. Use for up to 2 weeks. 03/07/16  Yes Mariel Aloe, MD  feeding supplement, ENSURE ENLIVE, (ENSURE ENLIVE) LIQD Take 237 mLs by mouth 2 (two) times daily between meals. 03/08/16   Mariel Aloe, MD    Physical Exam:  Constitutional: NAD, calm, comfortable Vitals:   04/08/16 1830 04/08/16 1900 04/08/16 2111 04/08/16 2257  BP: 107/68 109/68 108/69 113/79  Pulse: (!) 110 (!) 104 (!) 103 (!) 102  Resp: 14 13 14 16   Temp:   99.1 F (37.3 C) 100 F (37.8 C)  TempSrc:   Oral Oral  SpO2: 97% 93% 99% 98%  Weight:      Height:       Eyes: PERRL, lids and conjunctivae normal ENMT: Mucous membranes are moist. Posterior pharynx clear of any exudate or lesions. Neck: normal, supple, no masses, no thyromegaly Respiratory: clear to auscultation bilaterally, no wheezing, no crackles. Normal respiratory effort. No accessory muscle use.  Cardiovascular: Tachycardic at 102 BPM, no murmurs / rubs / gallops. No extremity edema. 2+ pedal pulses. No carotid bruits.  Abdomen: Soft, no tenderness, no masses palpated. No hepatosplenomegaly. Bowel sounds positive.  Musculoskeletal: no clubbing / cyanosis.  Good ROM, no contractures. Normal muscle tone.  Skin: Positive dressing on the posterior scalp and cervical area without significant discharge. The patient refused  removal of the dressing for further evaluation. Neurologic: CN 2-12 grossly intact. Sensation intact, DTR normal. Strength 5/5 in all 4.  Psychiatric: Normal judgment and insight. Alert and oriented x 3. Normal mood.    Labs on Admission: I have personally reviewed following labs and imaging studies  CBC:  Recent Labs Lab 04/05/16 1126 04/08/16 1856 04/08/16 1903  WBC 21.5* 30.7*  --   NEUTROABS 19,350* 27.7*  --   HGB 7.0* 7.1* 6.1*  HCT 22.7* 21.0* 18.0*  MCV 80.5 78.7  --   PLT 843* 623*  --    Basic Metabolic Panel:  Recent Labs Lab 04/05/16 1126 04/08/16 1856 04/08/16 1903  NA 132* 129* 130*  K 4.7 4.0 4.0  CL 94* 94* 93*  CO2 24 27  --   GLUCOSE 89 114* 119*  BUN 6* 7 4*  CREATININE 0.56* 0.55* 0.50*  CALCIUM 8.7 9.0  --    GFR: Estimated Creatinine Clearance: 109.3 mL/min (A) (by C-G formula based on SCr of 0.5 mg/dL (L)). Liver  Function Tests:  Recent Labs Lab 04/05/16 1126  AST 18  ALT 17  ALKPHOS 199*  BILITOT 0.3  PROT 6.7  ALBUMIN 2.4*   No results for input(s): LIPASE, AMYLASE in the last 168 hours. No results for input(s): AMMONIA in the last 168 hours. Coagulation Profile: No results for input(s): INR, PROTIME in the last 168 hours. Cardiac Enzymes: No results for input(s): CKTOTAL, CKMB, CKMBINDEX, TROPONINI in the last 168 hours. BNP (last 3 results) No results for input(s): PROBNP in the last 8760 hours. HbA1C: No results for input(s): HGBA1C in the last 72 hours. CBG: No results for input(s): GLUCAP in the last 168 hours. Lipid Profile: No results for input(s): CHOL, HDL, LDLCALC, TRIG, CHOLHDL, LDLDIRECT in the last 72 hours. Thyroid Function Tests: No results for input(s): TSH, T4TOTAL, FREET4, T3FREE, THYROIDAB in the last 72 hours. Anemia Panel: No results for input(s): VITAMINB12, FOLATE, FERRITIN, TIBC, IRON, RETICCTPCT in the last 72 hours. Urine analysis:    Component Value Date/Time   COLORURINE YELLOW 04/08/2016 2258    APPEARANCEUR CLEAR 04/08/2016 2258   LABSPEC >1.046 (H) 04/08/2016 2258   PHURINE 7.0 04/08/2016 2258   GLUCOSEU NEGATIVE 04/08/2016 2258   HGBUR NEGATIVE 04/08/2016 2258   BILIRUBINUR NEGATIVE 04/08/2016 2258   KETONESUR NEGATIVE 04/08/2016 2258   PROTEINUR NEGATIVE 04/08/2016 2258   UROBILINOGEN 1.0 02/20/2012 1200   NITRITE NEGATIVE 04/08/2016 2258   LEUKOCYTESUR NEGATIVE 04/08/2016 2258    Radiological Exams on Admission: Dg Chest 2 View  Result Date: 04/08/2016 CLINICAL DATA:  Chest pain EXAM: CHEST  2 VIEW COMPARISON:  02/27/2016 FINDINGS: The heart size and mediastinal contours are within normal limits. Both lungs are clear. The visualized skeletal structures are unremarkable. Probable linear artifacts on the lateral view posteriorly. IMPRESSION: No active cardiopulmonary disease. Electronically Signed   By: Donavan Foil M.D.   On: 04/08/2016 18:24   Ct Angio Chest Pe W And/or Wo Contrast  Result Date: 04/08/2016 CLINICAL DATA:  46 year old male with chest pain. Evaluate for PE. History of squamous cell carcinoma of the skin. EXAM: CT ANGIOGRAPHY CHEST WITH CONTRAST TECHNIQUE: Multidetector CT imaging of the chest was performed using the standard protocol during bolus administration of intravenous contrast. Multiplanar CT image reconstructions and MIPs were obtained to evaluate the vascular anatomy. CONTRAST:  100 cc Isovue 370 COMPARISON:  Chest radiograph dated 04/08/2016 FINDINGS: Cardiovascular: There is no cardiomegaly or pericardial effusion. The thoracic aorta appears unremarkable. The origins of the great vessels of the aortic arch appear patent. There is mild dilatation of the main pulmonary trunk suggestive of mild underlying pulmonary hypertension. There is no CT evidence of pulmonary embolism. Mediastinum/Nodes: There is no hilar or mediastinal adenopathy. There is mild thickened appearance of the esophagus which may be related to underdistention. Clinical correlation is  recommended to evaluate for esophagitis. The thyroid gland is grossly unremarkable. Lungs/Pleura: There are multiple small scattered bilateral pulmonary nodules measuring up to 6 mm in the left lower lobe (series 11, image 45). There is no focal consolidation, pleural effusion, or pneumothorax. The central airways are patent. Upper Abdomen: Multiple low attenuating liver lesions noted measuring up to 18 x 16 mm compatible with metastatic disease in this patient with known metastatic squamous cell carcinoma. The visualized upper abdomen is otherwise unremarkable. Musculoskeletal: There is loss of subcutaneous fat. The osseous structures are intact. Review of the MIP images confirms the above findings. IMPRESSION: 1. No CT evidence of pulmonary embolism. 2. Multiple small scattered bilateral pulmonary nodules measuring  up to 6 mm. These nodules may be reactive or represent metastatic disease. 3. Mildly prominent central pulmonary arteries suggestive of underlying pulmonary hypertension. 4. Multiple hepatic hypodense lesions most consistent with metastatic disease. Electronically Signed   By: Anner Crete M.D.   On: 04/08/2016 21:01    EKG: Independently reviewed Vent. rate 111 BPM PR interval * ms QRS duration 85 ms QT/QTc 314/427 ms P-R-T axes 76 77 55 Sinus tachycardia Baseline wander in lead(s) V2  Assessment/Plan Principal Problem:   Fever of undetermined origin Admit to telemetry a/inpatient. Continue IV fluids. Cefepime per pharmacy. Vancomycin per pharmacy. Follow-up WBC, blood cultures and sensitivity.  Active Problems:   Squamous cell carcinoma of neck The patient declined evaluation of the lesion. Continue analgesics as needed.    Chronic anemia Monitor hematocrit and hemoglobin. Continue iron supplementation. Transfuse as needed.    Hyponatremia Likely due to decreased oral intake today and fever. Continue normal saline infusion. Follow-up sodium level in a.m.     Leukocytosis The patient at baseline has an elevated white count in the low 20k. Monitor WBC.    DVT prophylaxis: SCDs. Code Status: Full code. Family Communication: Mother was present in the telemetry room. Disposition Plan: Admit for IV antibiotic therapy and further workup. Consults called:  Admission status: Inpatient/telemetry.   Reubin Milan MD [facility] Pager [phone].  If 7PM-7AM, please contact night-coverage [email or web address] Password [facility]  04/08/2016, 11:20 PM

## 2016-04-08 NOTE — ED Notes (Signed)
Bed: WA17 Expected date:  Expected time:  Means of arrival:  Comments: Pt from ca center

## 2016-04-08 NOTE — ED Notes (Signed)
Attempted IV start x2.  Patient did not allow me to advance IV catheter all the way into the vein.  States it hurts to bad.  Patient requesting to only be stuck in the right arm.  Patient requests to be stuck in his hand where he was previously stuck for blood transfusion today.  Complied with patient's request but patient requested that I take this IV out and that I get someone else to come and stick him.  Patient continues to complain of "excruciating pain" but refused morphine and zofran.  Requested percocet.  MD aware.

## 2016-04-08 NOTE — ED Notes (Signed)
Pt wished to wait and get pain medicine and stuck for blood cultures until after he ate.

## 2016-04-08 NOTE — Progress Notes (Signed)
Patient ID: Johnny Mooney, male   DOB: [birth year], 46 y.o.   MRN: [id number]  1st unit PRBC transfusion begun at 1041. Pt's temp 99.9 orally. No complications noted.   2nd unit PRBC transfusion begun at 1236. Pt's temp at that time 99.2 orally. No complications noted.  Discharge information reviewed with pt and mother (caregiver), all questions answered, paperwork signed.  IV removed without difficulty or complications. VSS, current temp 99.8. Pt very sleepy after Benadryl premed; assisted to car by RN.  Pt discharged from [facility] at 1445 in stable condition accompanied by his mother. [name], RN 04/08/2016

## 2016-04-09 DIAGNOSIS — E871 Hypo-osmolality and hyponatremia: Secondary | ICD-10-CM

## 2016-04-09 DIAGNOSIS — L03221 Cellulitis of neck: Secondary | ICD-10-CM

## 2016-04-09 LAB — COMPREHENSIVE METABOLIC PANEL
ALK PHOS: 263 U/L — AB (ref 38–126)
ALT: 33 U/L (ref 17–63)
ANION GAP: 10 (ref 5–15)
AST: 57 U/L — ABNORMAL HIGH (ref 15–41)
Albumin: 2.3 g/dL — ABNORMAL LOW (ref 3.5–5.0)
BILIRUBIN TOTAL: 1.3 mg/dL — AB (ref 0.3–1.2)
BUN: 6 mg/dL (ref 6–20)
CALCIUM: 9.3 mg/dL (ref 8.9–10.3)
CO2: 26 mmol/L (ref 22–32)
CREATININE: 0.64 mg/dL (ref 0.61–1.24)
Chloride: 96 mmol/L — ABNORMAL LOW (ref 101–111)
GFR calc Af Amer: >60 mL/min (ref >60)
Glucose, Bld: 115 mg/dL — ABNORMAL HIGH (ref 65–99)
Potassium: 4.8 mmol/L (ref 3.5–5.1)
SODIUM: 132 mmol/L — AB (ref 135–145)
TOTAL PROTEIN: 9 g/dL — AB (ref 6.5–8.1)

## 2016-04-09 LAB — CBC WITH DIFFERENTIAL/PLATELET
Basophils Absolute: 0 10*3/uL (ref 0.0–0.1)
Basophils Relative: 0 %
EOS ABS: 0 10*3/uL (ref 0.0–0.7)
Eosinophils Relative: 0 %
HCT: 32.2 % — ABNORMAL LOW (ref 39.0–52.0)
HEMOGLOBIN: 10.3 g/dL — AB (ref 13.0–17.0)
Lymphocytes Relative: 3 %
Lymphs Abs: 0.7 10*3/uL (ref 0.7–4.0)
MCH: 25.6 pg — AB (ref 26.0–34.0)
MCHC: 32 g/dL (ref 30.0–36.0)
MCV: 79.9 fL (ref 78.0–100.0)
MONOS PCT: 4 %
Monocytes Absolute: 0.8 10*3/uL (ref 0.1–1.0)
Neutro Abs: 19.5 10*3/uL — ABNORMAL HIGH (ref 1.7–7.7)
Neutrophils Relative %: 93 %
Platelets: 650 10*3/uL — ABNORMAL HIGH (ref 150–400)
RBC: 4.03 MIL/uL — ABNORMAL LOW (ref 4.22–5.81)
RDW: 17.4 % — ABNORMAL HIGH (ref 11.5–15.5)
WBC: 21 10*3/uL — AB (ref 4.0–10.5)

## 2016-04-09 LAB — TROPONIN I: Troponin I: 0.04 ng/mL (ref <0.03)

## 2016-04-09 LAB — INFLUENZA PANEL BY PCR (TYPE A & B)
Influenza A By PCR: NEGATIVE
Influenza B By PCR: NEGATIVE

## 2016-04-09 MED ORDER — MAGIC MOUTHWASH
5.0000 mL | Freq: Three times a day (TID) | ORAL | Status: DC | PRN
  Administered 2016-04-15: 5 mL via ORAL
  Filled 2016-04-09 (×2): qty 5

## 2016-04-09 MED ORDER — DEXTROSE 5 % IV SOLN
2.0000 g | Freq: Three times a day (TID) | INTRAVENOUS | Status: DC
  Administered 2016-04-09 – 2016-04-10 (×5): 2 g via INTRAVENOUS
  Filled 2016-04-09 (×6): qty 2

## 2016-04-09 MED ORDER — LIDOCAINE VISCOUS 2 % MT SOLN
15.0000 mL | OROMUCOSAL | Status: DC | PRN
  Filled 2016-04-09: qty 15

## 2016-04-09 MED ORDER — GI COCKTAIL ~~LOC~~
30.0000 mL | Freq: Once | ORAL | Status: DC
  Filled 2016-04-09: qty 30

## 2016-04-09 MED ORDER — VANCOMYCIN HCL IN DEXTROSE 1-5 GM/200ML-% IV SOLN
1000.0000 mg | Freq: Three times a day (TID) | INTRAVENOUS | Status: DC
  Administered 2016-04-09 (×3): 1000 mg via INTRAVENOUS
  Filled 2016-04-09 (×3): qty 200

## 2016-04-09 MED ORDER — PANTOPRAZOLE SODIUM 40 MG IV SOLR
40.0000 mg | Freq: Two times a day (BID) | INTRAVENOUS | Status: DC
  Administered 2016-04-09 – 2016-04-12 (×7): 40 mg via INTRAVENOUS
  Filled 2016-04-09 (×7): qty 40

## 2016-04-09 MED ORDER — SUCRALFATE 1 G PO TABS
1.0000 g | ORAL_TABLET | Freq: Three times a day (TID) | ORAL | Status: DC
  Administered 2016-04-09 – 2016-04-18 (×27): 1 g via ORAL
  Filled 2016-04-09 (×28): qty 1

## 2016-04-09 MED ORDER — MORPHINE SULFATE (PF) 4 MG/ML IV SOLN
1.0000 mg | INTRAVENOUS | Status: DC | PRN
  Filled 2016-04-09: qty 1

## 2016-04-09 MED ORDER — ENSURE ENLIVE PO LIQD
237.0000 mL | Freq: Two times a day (BID) | ORAL | Status: DC
  Administered 2016-04-09 – 2016-04-18 (×7): 237 mL via ORAL

## 2016-04-09 MED ORDER — HYDROMORPHONE HCL 1 MG/ML IJ SOLN
1.0000 mg | Freq: Once | INTRAMUSCULAR | Status: AC
  Administered 2016-04-09: 1 mg via INTRAVENOUS

## 2016-04-09 NOTE — Progress Notes (Signed)
Pharmacy Antibiotic Note  Johnny Mooney is a 46 y.o. male admitted on 04/08/2016 with sepsis.  Pharmacy has been consulted for Vancomycin, cefepime dosing.  Plan: Vancomycin 1gm IV every 8 hours.  Goal trough 15-20 mcg/mL.  Cefepime 2gm iv q8hr  Height: 5\' 10"  (177.8 cm) Weight: 143 lb (64.9 kg) IBW/kg (Calculated) : 73  Temp (24hrs), Avg:100.3 F (37.9 C), Min:98.9 F (37.2 C), Max:102.1 F (38.9 C)   Recent Labs Lab 04/05/16 1126 04/08/16 1856 04/08/16 1903 04/08/16 2237 04/09/16 0130  WBC 21.5* 30.7*  --   --  21.0*  CREATININE 0.56* 0.55* 0.50*  --  0.64  LATICACIDVEN  --   --   --  0.98  --     Estimated Creatinine Clearance: 109.3 mL/min (by C-G formula based on SCr of 0.64 mg/dL).    No Known Allergies  Antimicrobials this admission: Vancomycin 04/09/2016 >> Cefepime 04/09/2016 >>   Dose adjustments this admission: -  Microbiology results: pending  Thank you for allowing pharmacy to be a part of this patient's care.  Nani Skillern Crowford 04/09/2016 2:59 AM

## 2016-04-09 NOTE — Progress Notes (Addendum)
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 Presents with chest pain for about 12 hours before admission, pleuritic, associated with fever. On initial physical examination, patient was tachycardic and febrile. Significant lesion on the posterior scalp. Positive leukocytosis. Admitted to the hospital with working diagnosis of sepsis. Placed on broad spectrum antibiotic therapy.     Assessment & Plan:   Principal Problem:   Fever of undetermined origin Active Problems:   Squamous cell carcinoma of neck   Chronic anemia   Hyponatremia   Leukocytosis   1. Suspected sepsis suspected neck cellulitis. Will plan to closely examine wound at the time of dressing changes, positive pain and erythema at the base of the neck. Will continue broad spectrum antibiotic therapy with vancomycin and cefepime. Continue IV fluids with isotonic solutions at 100 ml/hr. WBC trending down to 21, but patient still febrile, may need to do imaging of the neck if no improvement over the next 24 hours. Chest film personally reviewed noted bibasilar atelectasis.   2. 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 Continue pain control and local wound care. On radiation therapy.   3. Chronic anemia. Hb and hct stable, no signs of active bleeding, no need for blood transfusion.   4. Hyponatremia. Will continue hydration with saline at 75 cc.hr, follow on renal panel in am. NA at 132.   5. Suspected gastritis. Will add antiacid therapy with protonix and sucralfate. x1 GI cocktail. Diet as tolerated.    DVT prophylaxis: enoxaparin  Code Status: full  Family Communication: No family at the bedside  Disposition Plan: home   Consultants:     Procedures:     Antimicrobials:   Vancomycin    Cefepime     Subjective: Patient complains of abdominal pain, severe in intensity, dull in nature, no radiation, worse with movement, no improving factors, associated with dyspnea.   Objective: Vitals:   04/08/16 2111 04/08/16 2257 04/09/16 0106 04/09/16 0655  BP: 108/69 113/79 111/74 110/69  Pulse: (!) 103 (!) 102 (!) 115 (!) 114  Resp: 14 16 16 16   Temp: 99.1 F (37.3 C) 100 F (37.8 C) (!) 102.1 F (38.9 C) (!) 102.4 F (39.1 C)  TempSrc: Oral Oral Oral Oral  SpO2: 99% 98% 100% 96%  Weight:      Height:        Intake/Output Summary (Last 24 hours) at 04/09/16 0949 Last data filed at 04/09/16 0243  Gross per 24 hour  Intake                0 ml  Output              250 ml  Net             -250 ml   Filed Weights   04/08/16 1723  Weight: 64.9 kg (143 lb)    Examination:  General exam: deconditioned and in pain. E ENT.mild pallor, oral mucosa dry, no icterus. Respiratory system: Clear to auscultation. Respiratory effort normal. No wheezing, rales or rhonchi.  Cardiovascular system: S1 & S2 heard, RRR. No JVD, murmurs, rubs, gallops or clicks. No pedal edema. Gastrointestinal system: Abdomen is nondistended, soft and nontender. No organomegaly or masses felt. Normal bowel sounds heard. Central nervous system: Alert and oriented. No focal neurological deficits. Extremities: Symmetric  5 x 5 power. Skin: necks lesion with dressing in place, noted erythema at the base of the dressing with mild increased local temperature.        Data Reviewed: I have personally reviewed following labs and imaging studies  CBC:  Recent Labs Lab 04/05/16 1126 04/08/16 1856 04/08/16 1903 04/09/16 0130  WBC 21.5* 30.7*  --  21.0*  NEUTROABS 19,350* 27.7*  --  19.5*  HGB 7.0* 7.1* 6.1* 10.3*  HCT 22.7* 21.0* 18.0* 32.2*  MCV 80.5 78.7  --  79.9  PLT 843* 623*  --  951*   Basic Metabolic Panel:  Recent Labs Lab 04/05/16 1126 04/08/16 1856 04/08/16 1903 04/09/16 0130  NA 132* 129*  130* 132*  K 4.7 4.0 4.0 4.8  CL 94* 94* 93* 96*  CO2 24 27  --  26  GLUCOSE 89 114* 119* 115*  BUN 6* 7 4* 6  CREATININE 0.56* 0.55* 0.50* 0.64  CALCIUM 8.7 9.0  --  9.3   GFR: Estimated Creatinine Clearance: 109.3 mL/min (by C-G formula based on SCr of 0.64 mg/dL). Liver Function Tests:  Recent Labs Lab 04/05/16 1126 04/09/16 0130  AST 18 57*  ALT 17 33  ALKPHOS 199* 263*  BILITOT 0.3 1.3*  PROT 6.7 9.0*  ALBUMIN 2.4* 2.3*   No results for input(s): LIPASE, AMYLASE in the last 168 hours. No results for input(s): AMMONIA in the last 168 hours. Coagulation Profile: No results for input(s): INR, PROTIME in the last 168 hours. Cardiac Enzymes:  Recent Labs Lab 04/09/16 0600  TROPONINI 0.04*   BNP (last 3 results) No results for input(s): PROBNP in the last 8760 hours. HbA1C: No results for input(s): HGBA1C in the last 72 hours. CBG: No results for input(s): GLUCAP in the last 168 hours. Lipid Profile: No results for input(s): CHOL, HDL, LDLCALC, TRIG, CHOLHDL, LDLDIRECT in the last 72 hours. Thyroid Function Tests: No results for input(s): TSH, T4TOTAL, FREET4, T3FREE, THYROIDAB in the last 72 hours. Anemia Panel: No results for input(s): VITAMINB12, FOLATE, FERRITIN, TIBC, IRON, RETICCTPCT in the last 72 hours. Sepsis Labs:  Recent Labs Lab 04/08/16 2237  LATICACIDVEN 0.98    No results found for this or any previous visit (from the past 240 hour(s)).       Radiology Studies: Dg Chest 2 View  Result Date: 04/08/2016 CLINICAL DATA:  Chest pain EXAM: CHEST  2 VIEW COMPARISON:  02/27/2016 FINDINGS: The heart size and mediastinal contours are within normal limits. Both lungs are clear. The visualized skeletal structures are unremarkable. Probable linear artifacts on the lateral view posteriorly. IMPRESSION: No active cardiopulmonary disease. Electronically Signed   By: Donavan Foil M.D.   On: 04/08/2016 18:24   Ct Angio Chest Pe W And/or Wo  Contrast  Result Date: 04/08/2016 CLINICAL DATA:  46 year old male with chest pain. Evaluate for PE. History of squamous cell carcinoma of the skin. EXAM: CT ANGIOGRAPHY CHEST WITH CONTRAST TECHNIQUE: Multidetector CT imaging of the chest was performed using the standard protocol during bolus administration of intravenous contrast. Multiplanar CT image reconstructions and MIPs were obtained to evaluate the vascular anatomy. CONTRAST:  100 cc Isovue 370 COMPARISON:  Chest radiograph dated 04/08/2016 FINDINGS: Cardiovascular: There is no cardiomegaly or pericardial effusion. The thoracic aorta appears unremarkable. The origins of the great vessels of the aortic arch appear patent. There is mild dilatation of the main pulmonary trunk suggestive of mild underlying pulmonary hypertension. There is no CT evidence of pulmonary embolism. Mediastinum/Nodes: There is no  hilar or mediastinal adenopathy. There is mild thickened appearance of the esophagus which may be related to underdistention. Clinical correlation is recommended to evaluate for esophagitis. The thyroid gland is grossly unremarkable. Lungs/Pleura: There are multiple small scattered bilateral pulmonary nodules measuring up to 6 mm in the left lower lobe (series 11, image 45). There is no focal consolidation, pleural effusion, or pneumothorax. The central airways are patent. Upper Abdomen: Multiple low attenuating liver lesions noted measuring up to 18 x 16 mm compatible with metastatic disease in this patient with known metastatic squamous cell carcinoma. The visualized upper abdomen is otherwise unremarkable. Musculoskeletal: There is loss of subcutaneous fat. The osseous structures are intact. Review of the MIP images confirms the above findings. IMPRESSION: 1. No CT evidence of pulmonary embolism. 2. Multiple small scattered bilateral pulmonary nodules measuring up to 6 mm. These nodules may be reactive or represent metastatic disease. 3. Mildly prominent  central pulmonary arteries suggestive of underlying pulmonary hypertension. 4. Multiple hepatic hypodense lesions most consistent with metastatic disease. Electronically Signed   By: Anner Crete M.D.   On: 04/08/2016 21:01        Scheduled Meds: . ceFEPime (MAXIPIME) IV  2 g Intravenous Q8H  . feeding supplement (ENSURE ENLIVE)  237 mL Oral BID BM  .  HYDROmorphone (DILAUDID) injection  1 mg Intravenous Once  . magnesium chloride  2 tablet Oral BID  . naproxen  500 mg Oral BID WC  . polyethylene glycol  17 g Oral Daily  . senna  1 tablet Oral QHS  . sodium chloride flush  3 mL Intravenous Q12H  . vancomycin  1,000 mg Intravenous Q8H   Continuous Infusions: . sodium chloride 125 mL/hr at 04/09/16 0052     LOS: 1 day       Tawni Millers, MD [facility] Pager [phone]  If 7PM-7AM, please contact night-coverage [email or web address] Password [facility] 04/09/2016, 9:49 AM

## 2016-04-10 DIAGNOSIS — K2900 Acute gastritis without bleeding: Secondary | ICD-10-CM

## 2016-04-10 LAB — URINE CULTURE: CULTURE: NO GROWTH

## 2016-04-10 LAB — COMPREHENSIVE METABOLIC PANEL
ALBUMIN: 1.6 g/dL — AB (ref 3.5–5.0)
ALK PHOS: 204 U/L — AB (ref 38–126)
ALT: 27 U/L (ref 17–63)
ANION GAP: 6 (ref 5–15)
AST: 28 U/L (ref 15–41)
BUN: <5 mg/dL — ABNORMAL LOW (ref 6–20)
CHLORIDE: 101 mmol/L (ref 101–111)
CO2: 25 mmol/L (ref 22–32)
Calcium: 8.5 mg/dL — ABNORMAL LOW (ref 8.9–10.3)
Creatinine, Ser: 0.44 mg/dL — ABNORMAL LOW (ref 0.61–1.24)
GFR calc non Af Amer: >60 mL/min (ref >60)
GLUCOSE: 97 mg/dL (ref 65–99)
Potassium: 3.8 mmol/L (ref 3.5–5.1)
SODIUM: 132 mmol/L — AB (ref 135–145)
Total Bilirubin: 0.5 mg/dL (ref 0.3–1.2)
Total Protein: 6.2 g/dL — ABNORMAL LOW (ref 6.5–8.1)

## 2016-04-10 LAB — CBC WITH DIFFERENTIAL/PLATELET
BASOS PCT: 0 %
Basophils Absolute: 0 10*3/uL (ref 0.0–0.1)
EOS ABS: 0.1 10*3/uL (ref 0.0–0.7)
EOS PCT: 1 %
HCT: 21.6 % — ABNORMAL LOW (ref 39.0–52.0)
HEMOGLOBIN: 7.2 g/dL — AB (ref 13.0–17.0)
LYMPHS ABS: 0.8 10*3/uL (ref 0.7–4.0)
LYMPHS PCT: 4 %
MCH: 26.7 pg (ref 26.0–34.0)
MCHC: 33.3 g/dL (ref 30.0–36.0)
MCV: 80 fL (ref 78.0–100.0)
MONOS PCT: 5 %
Monocytes Absolute: 1.1 10*3/uL — ABNORMAL HIGH (ref 0.1–1.0)
NEUTROS ABS: 19 10*3/uL — AB (ref 1.7–7.7)
Neutrophils Relative %: 90 %
PLATELETS: 547 10*3/uL — AB (ref 150–400)
RBC: 2.7 MIL/uL — AB (ref 4.22–5.81)
RDW: 18.1 % — ABNORMAL HIGH (ref 11.5–15.5)
WBC: 21.1 10*3/uL — ABNORMAL HIGH (ref 4.0–10.5)

## 2016-04-10 LAB — HEMOGLOBIN AND HEMATOCRIT, BLOOD
HEMATOCRIT: 26.2 % — AB (ref 39.0–52.0)
Hemoglobin: 8.7 g/dL — ABNORMAL LOW (ref 13.0–17.0)

## 2016-04-10 MED ORDER — HYDROMORPHONE HCL 1 MG/ML IJ SOLN
1.0000 mg | INTRAMUSCULAR | Status: DC | PRN
  Administered 2016-04-10 – 2016-04-16 (×27): 1 mg via INTRAVENOUS
  Filled 2016-04-10 (×27): qty 1

## 2016-04-10 MED ORDER — DIPHENHYDRAMINE HCL 50 MG PO CAPS
50.0000 mg | ORAL_CAPSULE | Freq: Once | ORAL | Status: AC
  Administered 2016-04-10: 50 mg via ORAL
  Filled 2016-04-10: qty 1

## 2016-04-10 MED ORDER — DIPHENHYDRAMINE HCL 25 MG PO CAPS
25.0000 mg | ORAL_CAPSULE | ORAL | Status: DC | PRN
  Administered 2016-04-15: 25 mg via ORAL
  Filled 2016-04-10: qty 1

## 2016-04-10 NOTE — Progress Notes (Addendum)
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 Presents with chest pain for about 12 hours before admission, pleuritic, associated with fever. On initial physical examination, patient was tachycardic and febrile. Significant lesion on the posterior scalp. Positive leukocytosis. Admitted to the hospital with working diagnosis of sepsis. Placed on broad spectrum antibiotic therapy on admission. No signs of infection, trial off antibiotics.      Assessment & Plan:   Principal Problem:   Fever of undetermined origin Active Problems:   Squamous cell carcinoma of neck   Chronic anemia   Hyponatremia   Leukocytosis    1. Suspected sepsis. Wound personally reviewed noted no purulence, margins are clean, no odor. Mild erythema at the site of adhesive tape from dressing. Improved temperature curve with t max this am 100,3. Patient with chronic leukocytosis, will hold on antibiotic therapy and follow on temperature curve. If worsening condition will plan for CT neck to assess deep tissues. Suspected cancer related fever.   2. 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 Daily local wound care, wet to dry per nursing. On radiation therapy.   3. Chronic anemia. Repeat hb at 8,7, with no signs of active bleeding, likely malignancy related, will check iron stores. No need for blood transfusion.   4. Hyponatremia. Hydration with saline at 75 cc.hr, poor oral intake. Stable sodium at 132.    5. Gastritis. Good response to antiacid therapy will continue with protonix and sucralfate. Poor oral intake but no nausea or vomiting.   DVT prophylaxis: enoxaparin  Code Status: full  Family Communication: No family at the bedside  Disposition Plan: home   Consultants:     Procedures:     Antimicrobials:   Vancomycin    Cefepime    Subjective: Patient reports improved abdominal and chest pain, no nausea or vomiting, Positive headache. Tolerating dressings changes well, daily.   Objective: Vitals:   04/09/16 0655 04/09/16 1308 04/09/16 2014 04/10/16 0503  BP: 110/69 102/67 104/65 110/65  Pulse: (!) 114 95 95 (!) 110  Resp: 16 18 18 19   Temp: (!) 102.4 F (39.1 C) 99 F (37.2 C) 98.7 F (37.1 C) 100.3 F (37.9 C)  TempSrc: Oral Oral Oral Oral  SpO2: 96% 100% 99% 100%  Weight:      Height:        Intake/Output Summary (Last 24 hours) at 04/10/16 1111 Last data filed at 04/10/16 0557  Gross per 24 hour  Intake          5085.42 ml  Output              800 ml  Net          4285.42 ml   Filed Weights   04/08/16 1723  Weight: 64.9 kg (143 lb)    Examination:  General exam: deconditioned E ENT: mild pallor, no icterus, oral mucosa moist  Respiratory system: Clear to auscultation. Respiratory effort normal. No wheezing, rales or rhonchi.  Cardiovascular system: S1 & S2 heard, RRR. No JVD, murmurs, rubs, gallops or clicks. No pedal edema. Gastrointestinal system: Abdomen is nondistended, soft and nontender. No organomegaly or masses felt. Normal bowel sounds heard. Central nervous system: Alert and oriented. No focal neurological deficits. Extremities: Symmetric 5 x 5 power. Skin: Large ulcerated lesion at the base of the  neck, clean margins, no purulence or odor.     Data Reviewed: I have personally reviewed following labs and imaging studies  CBC:  Recent Labs Lab 04/05/16 1126 04/08/16 1856 04/08/16 1903 04/09/16 0130 04/10/16 0840  WBC 21.5* 30.7*  --  21.0* 21.1*  NEUTROABS 19,350* 27.7*  --  19.5* 19.0*  HGB 7.0* 7.1* 6.1* 10.3* 7.2*  HCT 22.7* 21.0* 18.0* 32.2* 21.6*  MCV 80.5 78.7  --  79.9 80.0  PLT 843* 623*  --  650* 573*   Basic Metabolic Panel:  Recent Labs Lab 04/05/16 1126 04/08/16 1856 04/08/16 1903  04/09/16 0130 04/10/16 0840  NA 132* 129* 130* 132* 132*  K 4.7 4.0 4.0 4.8 3.8  CL 94* 94* 93* 96* 101  CO2 24 27  --  26 25  GLUCOSE 89 114* 119* 115* 97  BUN 6* 7 4* 6 <5*  CREATININE 0.56* 0.55* 0.50* 0.64 0.44*  CALCIUM 8.7 9.0  --  9.3 8.5*   GFR: Estimated Creatinine Clearance: 109.3 mL/min (A) (by C-G formula based on SCr of 0.44 mg/dL (L)). Liver Function Tests:  Recent Labs Lab 04/05/16 1126 04/09/16 0130 04/10/16 0840  AST 18 57* 28  ALT 17 33 27  ALKPHOS 199* 263* 204*  BILITOT 0.3 1.3* 0.5  PROT 6.7 9.0* 6.2*  ALBUMIN 2.4* 2.3* 1.6*   No results for input(s): LIPASE, AMYLASE in the last 168 hours. No results for input(s): AMMONIA in the last 168 hours. Coagulation Profile: No results for input(s): INR, PROTIME in the last 168 hours. Cardiac Enzymes:  Recent Labs Lab 04/09/16 0600  TROPONINI 0.04*   BNP (last 3 results) No results for input(s): PROBNP in the last 8760 hours. HbA1C: No results for input(s): HGBA1C in the last 72 hours. CBG: No results for input(s): GLUCAP in the last 168 hours. Lipid Profile: No results for input(s): CHOL, HDL, LDLCALC, TRIG, CHOLHDL, LDLDIRECT in the last 72 hours. Thyroid Function Tests: No results for input(s): TSH, T4TOTAL, FREET4, T3FREE, THYROIDAB in the last 72 hours. Anemia Panel: No results for input(s): VITAMINB12, FOLATE, FERRITIN, TIBC, IRON, RETICCTPCT in the last 72 hours. Sepsis Labs:  Recent Labs Lab 04/08/16 2237  LATICACIDVEN 0.98    Recent Results (from the past 240 hour(s))  Urine culture     Status: None   Collection Time: 04/08/16 10:58 PM  Result Value Ref Range Status   Specimen Description URINE, CLEAN CATCH  Final   Special Requests NONE  Final   Culture   Final    NO GROWTH Performed at [facility], [address] [place], [place] [address]    Report Status 04/10/2016 FINAL  Final         Radiology Studies: Dg Chest 2 View  Result Date: 04/08/2016 CLINICAL  DATA:  Chest pain EXAM: CHEST  2 VIEW COMPARISON:  02/27/2016 FINDINGS: The heart size and mediastinal contours are within normal limits. Both lungs are clear. The visualized skeletal structures are unremarkable. Probable linear artifacts on the lateral view posteriorly. IMPRESSION: No active cardiopulmonary disease. Electronically Signed   By: Donavan Foil M.D.   On: 04/08/2016 18:24   Ct Angio Chest Pe W And/or Wo Contrast  Result Date: 04/08/2016 CLINICAL DATA:  46 year old male with chest pain. Evaluate for PE. History of squamous cell carcinoma of the skin. EXAM: CT ANGIOGRAPHY CHEST WITH CONTRAST TECHNIQUE: Multidetector CT imaging of the chest was performed using the standard protocol during bolus administration of intravenous contrast. Multiplanar CT image reconstructions  and MIPs were obtained to evaluate the vascular anatomy. CONTRAST:  100 cc Isovue 370 COMPARISON:  Chest radiograph dated 04/08/2016 FINDINGS: Cardiovascular: There is no cardiomegaly or pericardial effusion. The thoracic aorta appears unremarkable. The origins of the great vessels of the aortic arch appear patent. There is mild dilatation of the main pulmonary trunk suggestive of mild underlying pulmonary hypertension. There is no CT evidence of pulmonary embolism. Mediastinum/Nodes: There is no hilar or mediastinal adenopathy. There is mild thickened appearance of the esophagus which may be related to underdistention. Clinical correlation is recommended to evaluate for esophagitis. The thyroid gland is grossly unremarkable. Lungs/Pleura: There are multiple small scattered bilateral pulmonary nodules measuring up to 6 mm in the left lower lobe (series 11, image 45). There is no focal consolidation, pleural effusion, or pneumothorax. The central airways are patent. Upper Abdomen: Multiple low attenuating liver lesions noted measuring up to 18 x 16 mm compatible with metastatic disease in this patient with known metastatic squamous cell  carcinoma. The visualized upper abdomen is otherwise unremarkable. Musculoskeletal: There is loss of subcutaneous fat. The osseous structures are intact. Review of the MIP images confirms the above findings. IMPRESSION: 1. No CT evidence of pulmonary embolism. 2. Multiple small scattered bilateral pulmonary nodules measuring up to 6 mm. These nodules may be reactive or represent metastatic disease. 3. Mildly prominent central pulmonary arteries suggestive of underlying pulmonary hypertension. 4. Multiple hepatic hypodense lesions most consistent with metastatic disease. Electronically Signed   By: Anner Crete M.D.   On: 04/08/2016 21:01        Scheduled Meds: . ceFEPime (MAXIPIME) IV  2 g Intravenous Q8H  . feeding supplement (ENSURE ENLIVE)  237 mL Oral BID BM  . gi cocktail  30 mL Oral Once  .  HYDROmorphone (DILAUDID) injection  1 mg Intravenous Once  . magnesium chloride  2 tablet Oral BID  . naproxen  500 mg Oral BID WC  . pantoprazole (PROTONIX) IV  40 mg Intravenous Q12H  . polyethylene glycol  17 g Oral Daily  . senna  1 tablet Oral QHS  . sodium chloride flush  3 mL Intravenous Q12H  . sucralfate  1 g Oral TID WC & HS  . vancomycin  1,000 mg Intravenous Q8H   Continuous Infusions: . sodium chloride 75 mL/hr (04/10/16 0515)     LOS: 2 days       Mauricio Gerome Apley, MD [facility] Pager [phone]  If 7PM-7AM, please contact night-coverage [email or web address] Password [email or web address] 04/10/2016, 11:11 AM

## 2016-04-11 ENCOUNTER — Ambulatory Visit
Admission: RE | Admit: 2016-04-11 | Discharge: 2016-04-11 | Disposition: A | Discharge status: Home / Self Care | Payer: Medicaid Other | Source: Ambulatory Visit | Attending: Radiation Oncology | Admitting: Radiation Oncology

## 2016-04-11 ENCOUNTER — Ambulatory Visit: Payer: Medicaid Other | Admitting: Hematology and Oncology

## 2016-04-11 DIAGNOSIS — R071 Chest pain on breathing: Secondary | ICD-10-CM

## 2016-04-11 LAB — CBC WITH DIFFERENTIAL/PLATELET
BASOS ABS: 0 10*3/uL (ref 0.0–0.1)
BASOS PCT: 0 %
EOS PCT: 1 %
Eosinophils Absolute: 0.3 10*3/uL (ref 0.0–0.7)
HEMATOCRIT: 24.9 % — AB (ref 39.0–52.0)
HEMOGLOBIN: 7.9 g/dL — AB (ref 13.0–17.0)
LYMPHS ABS: 0.5 10*3/uL — AB (ref 0.7–4.0)
Lymphocytes Relative: 2 %
MCH: 25.3 pg — ABNORMAL LOW (ref 26.0–34.0)
MCHC: 31.7 g/dL (ref 30.0–36.0)
MCV: 79.8 fL (ref 78.0–100.0)
MONO ABS: 1.3 10*3/uL — AB (ref 0.1–1.0)
Monocytes Relative: 6 %
NEUTROS PCT: 91 %
Neutro Abs: 20.7 10*3/uL — ABNORMAL HIGH (ref 1.7–7.7)
Platelets: 568 10*3/uL — ABNORMAL HIGH (ref 150–400)
RBC: 3.12 MIL/uL — ABNORMAL LOW (ref 4.22–5.81)
RDW: 18.2 % — ABNORMAL HIGH (ref 11.5–15.5)
WBC: 22.8 10*3/uL — AB (ref 4.0–10.5)

## 2016-04-11 LAB — BPAM RBC
BLOOD PRODUCT EXPIRATION DATE: 201804092359
Blood Product Expiration Date: 201804092359
ISSUE DATE / TIME: 201803160835
ISSUE DATE / TIME: 201803160835
UNIT TYPE AND RH: 5100
UNIT TYPE AND RH: 5100

## 2016-04-11 LAB — BASIC METABOLIC PANEL
Anion gap: 9 (ref 5–15)
BUN: 7 mg/dL (ref 6–20)
CHLORIDE: 101 mmol/L (ref 101–111)
CO2: 24 mmol/L (ref 22–32)
CREATININE: 0.55 mg/dL — AB (ref 0.61–1.24)
Calcium: 9 mg/dL (ref 8.9–10.3)
GFR calc non Af Amer: >60 mL/min (ref >60)
Glucose, Bld: 94 mg/dL (ref 65–99)
Potassium: 4.2 mmol/L (ref 3.5–5.1)
SODIUM: 134 mmol/L — AB (ref 135–145)

## 2016-04-11 LAB — TYPE AND SCREEN
ABO/RH(D): O POS
Antibody Screen: NEGATIVE
UNIT DIVISION: 0
Unit division: 0

## 2016-04-11 MED ORDER — ONDANSETRON 4 MG PO TBDP: 4.0000 mg | ORAL_TABLET | Freq: Three times a day (TID) | ORAL | 0 refills | Status: AC | PRN

## 2016-04-11 MED ORDER — POLYETHYLENE GLYCOL 3350 17 G PO PACK: 17.0000 g | PACK | Freq: Every day | ORAL | 0 refills | Status: AC

## 2016-04-11 MED ORDER — PANTOPRAZOLE SODIUM 40 MG PO TBEC: 40.0000 mg | DELAYED_RELEASE_TABLET | Freq: Two times a day (BID) | ORAL | 0 refills | Status: DC

## 2016-04-11 MED ORDER — SUCRALFATE 1 G PO TABS: 1.0000 g | ORAL_TABLET | Freq: Three times a day (TID) | ORAL | 0 refills | Status: AC

## 2016-04-11 NOTE — Progress Notes (Signed)
Patient still trying to eat. Reported off to night shift nurse to F/U with plan of care.

## 2016-04-11 NOTE — Progress Notes (Signed)
Johnny Mooney presents today before his radiation treatment. He had requested his dressing be changed before his treatment today. His dressing has been removed for examination. He is currently an inpatient. He is feeling better per his report. He has no concerns at this point.

## 2016-04-11 NOTE — Discharge Summary (Addendum)
Physician Discharge Summary  Johnny Mooney ZTI:[id number] DOB: Mar 09, 1970 DOA: 04/08/2016  PCP: Arnoldo Morale, MD  Admit date: 04/08/2016 Discharge date: 04/11/2016 * clarification, patient was not discharge on this date, he was discharged on 04/18/2016 by Dr. Elon Jester.   Admitted From: Home  Disposition:  Home   Recommendations for Outpatient Follow-up:  1. Follow up with PCP in 1- weeks 2. Patient placed on antiacid therapy.  3. CT chest with multiple lung nodules measuring up to 6 mm, multiple hepatic hypodense lesions, consistent with possible metastasis.  Home Health: No  Equipment/Devices: Home   Discharge Condition: Home  CODE STATUS: Full  Diet recommendation: Regular  Brief/Interim Summary: This is a 46 year old male who presented to the hospital with the chief complaint of chest pain. 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 He developed fever while receiving blood transfusion on the day of admission, apparently low-grade fevers at home for the past few days prior. On initial physical examination his blood pressure was 107/68, heart rate 110, respiratory rate 14, temperature 100, oxygen saturation 98%. His mucous membranes were moist, his lungs were clear to auscultation bilaterally, heart S1-S2 present tachycardic, his abdomen was soft nontender, large lesion on his posterior neck scalp occipital area. Sodium 129, potassium 4.0, chloride 94, bicarbonate 27, glucose 114, BUN 7, creatinine 0.55, white count 30.7, hemoglobin 7.1, hematocrit 21.0, chest x-ray negative for infiltrates. CT chest negative for pulmonary embolism, multiple small scattered bilateral pulmonary nodules measuring up to 6 mm, reactive or metastatic disease. Multiple hepatic hypodense lesions most consistent with metastatic disease. Urinalysis negative for infection. EKG was sinus tachycardia.  The patient was admitted to the hospital working diagnosis of  fever of undetermined origin rule out sepsis.  1. Fever. Patient was placed on broad spectrum antibiotics, intravenous fluids, further review of skin lesion rule out for cellulitis, antibiotics were descalated with good toleration. Patient has chronic leukocytosis. He has remained afebrile for last 48 hours. Fever likely related to malignancy.   2. Gastritis/acute. Abdominal/chest pain improved with aggressive antiacid therapy with IV Protonix and by mouth sucralfate. He required intermittent opiates for breakthrough pain control. By the time of discharge his pain is well controlled, he will be discharged on Protonix and sucralfate. Stop naproxen.   3. Metastatics, skull carcinoma the neck. Patient had local wound care daily, continue radiation therapy. Imaging on the CT showed a possible metastatic disease to the lungs and liver, follow-up with oncology.  4. Chronic anemia. Hemoglobin and hematocrit remained stable, discharge hemoglobin 7.9.  5. Hyponatremia. Patient received IV fluids, discharge sodium 134. By the time of discharge he is able to tolerate by mouth diet without major complications.   Discharge Diagnoses:  Principal Problem:   Fever of undetermined origin Active Problems:   Squamous cell carcinoma of neck   Chronic anemia   Hyponatremia   Leukocytosis    Discharge Instructions   Allergies as of 04/11/2016   No Known Allergies     Medication List    STOP taking these medications   naproxen 500 MG tablet Commonly known as:  NAPROSYN     TAKE these medications   feeding supplement (ENSURE ENLIVE) Liqd Take 237 mLs by mouth 2 (two) times daily between meals.   lidocaine 2 % solution Commonly known as:  XYLOCAINE Pharmacy: Mix 50:50 with water.  Patient: Swish and swallow 57mL of this mixture, 31min before meals and at bedtime, up to QID.   magic mouthwash Soln  Take 5 mLs by mouth 3 (three) times daily as needed for mouth pain.   magnesium chloride 64 MG Tbec  SR tablet Commonly known as:  SLOW-MAG Take 2 tablets (128 mg total) by mouth 2 (two) times daily.   ondansetron 4 MG disintegrating tablet Commonly known as:  ZOFRAN-ODT Take 1 tablet (4 mg total) by mouth every 8 (eight) hours as needed for nausea.   oxyCODONE-acetaminophen 5-325 MG tablet Commonly known as:  PERCOCET/ROXICET Take 1 tablet by mouth every 4 (four) hours as needed for severe pain.   pantoprazole 40 MG tablet Commonly known as:  PROTONIX Take 1 tablet (40 mg total) by mouth 2 (two) times daily.   polyethylene glycol packet Commonly known as:  MIRALAX / GLYCOLAX Take 17 g by mouth daily.   senna 8.6 MG Tabs tablet Commonly known as:  SENOKOT Take 1 tablet (8.6 mg total) by mouth at bedtime.   sucralfate 1 g tablet Commonly known as:  CARAFATE Take 1 tablet (1 g total) by mouth 4 (four) times daily -  with meals and at bedtime.   terbinafine 1 % cream Commonly known as:  LAMISIL Apply topically 2 (two) times daily. Use for up to 2 weeks.      Follow-up Information    Arnoldo Morale, MD Follow up in 1 week(s).   Specialty:  Family Medicine Contact information: [place] [address] [phone]          No Known Allergies  Consultations:     Procedures/Studies: Dg Chest 2 View  Result Date: 04/08/2016 CLINICAL DATA:  Chest pain EXAM: CHEST  2 VIEW COMPARISON:  02/27/2016 FINDINGS: The heart size and mediastinal contours are within normal limits. Both lungs are clear. The visualized skeletal structures are unremarkable. Probable linear artifacts on the lateral view posteriorly. IMPRESSION: No active cardiopulmonary disease. Electronically Signed   By: Donavan Foil M.D.   On: 04/08/2016 18:24   Ct Angio Chest Pe W And/or Wo Contrast  Result Date: 04/08/2016 CLINICAL DATA:  46 year old male with chest pain. Evaluate for PE. History of squamous cell carcinoma of the skin. EXAM: CT ANGIOGRAPHY CHEST WITH CONTRAST TECHNIQUE:  Multidetector CT imaging of the chest was performed using the standard protocol during bolus administration of intravenous contrast. Multiplanar CT image reconstructions and MIPs were obtained to evaluate the vascular anatomy. CONTRAST:  100 cc Isovue 370 COMPARISON:  Chest radiograph dated 04/08/2016 FINDINGS: Cardiovascular: There is no cardiomegaly or pericardial effusion. The thoracic aorta appears unremarkable. The origins of the great vessels of the aortic arch appear patent. There is mild dilatation of the main pulmonary trunk suggestive of mild underlying pulmonary hypertension. There is no CT evidence of pulmonary embolism. Mediastinum/Nodes: There is no hilar or mediastinal adenopathy. There is mild thickened appearance of the esophagus which may be related to underdistention. Clinical correlation is recommended to evaluate for esophagitis. The thyroid gland is grossly unremarkable. Lungs/Pleura: There are multiple small scattered bilateral pulmonary nodules measuring up to 6 mm in the left lower lobe (series 11, image 45). There is no focal consolidation, pleural effusion, or pneumothorax. The central airways are patent. Upper Abdomen: Multiple low attenuating liver lesions noted measuring up to 18 x 16 mm compatible with metastatic disease in this patient with known metastatic squamous cell carcinoma. The visualized upper abdomen is otherwise unremarkable. Musculoskeletal: There is loss of subcutaneous fat. The osseous structures are intact. Review of the MIP images confirms the above findings. IMPRESSION: 1. No CT evidence of  pulmonary embolism. 2. Multiple small scattered bilateral pulmonary nodules measuring up to 6 mm. These nodules may be reactive or represent metastatic disease. 3. Mildly prominent central pulmonary arteries suggestive of underlying pulmonary hypertension. 4. Multiple hepatic hypodense lesions most consistent with metastatic disease. Electronically Signed   By: Anner Crete  M.D.   On: 04/08/2016 21:01       Subjective: Patient feeling well, pain has improved, no nausea or vomiting, tolerating po well.   Discharge Exam: Vitals:   04/11/16 0535 04/11/16 1336  BP: 118/73 96/60  Pulse: (!) 109 (!) 115  Resp: 19 18  Temp: 99.3 F (37.4 C) 98.9 F (37.2 C)   Vitals:   04/10/16 1348 04/10/16 2034 04/11/16 0535 04/11/16 1336  BP: (!) 100/53 100/65 118/73 96/60  Pulse: (!) 102 95 (!) 109 (!) 115  Resp: 18 19 19 18   Temp: 98.6 F (37 C) 98.5 F (36.9 C) 99.3 F (37.4 C) 98.9 F (37.2 C)  TempSrc: Oral Oral Oral Oral  SpO2: 97% 100% 100% 99%  Weight:      Height:        General: Pt is alert, awake, not in acute distress. Large ulcerated mass on the base of the neck/ occipital on the right, no purulence, no odor.  Cardiovascular: RRR, S1/S2 +, no rubs, no gallops Respiratory: CTA bilaterally, no wheezing, no rhonchi Abdominal: Soft, NT, ND, bowel sounds + Extremities: no edema, no cyanosis    The results of significant diagnostics from this hospitalization (including imaging, microbiology, ancillary and laboratory) are listed below for reference.     Microbiology: Recent Results (from the past 240 hour(s))  Urine culture     Status: None   Collection Time: 04/08/16 10:58 PM  Result Value Ref Range Status   Specimen Description URINE, CLEAN CATCH  Final   Special Requests NONE  Final   Culture   Final    NO GROWTH Performed at [facility], [address], [place], [place] [address]    Report Status 04/10/2016 FINAL  Final  Blood culture (routine x 2)     Status: None (Preliminary result)   Collection Time: 04/09/16  1:23 AM  Result Value Ref Range Status   Specimen Description BLOOD LEFT FOREARM  Final   Special Requests IN PEDIATRIC BOTTLE 2ML  Final   Culture   Final    NO GROWTH 1 DAY Performed at [facility], [place] [address], [place], [place] [address]    Report Status PENDING  Incomplete  Blood culture (routine x  2)     Status: None (Preliminary result)   Collection Time: 04/09/16  1:23 AM  Result Value Ref Range Status   Specimen Description BLOOD LEFT HAND  Final   Special Requests IN PEDIATRIC BOTTLE 2ML  Final   Culture   Final    NO GROWTH 1 DAY Performed at [facility], [place] [address] [place], [place] [address]    Report Status PENDING  Incomplete     Labs: BNP (last 3 results)  Recent Labs  04/08/16 1856  BNP 49.7   Basic Metabolic Panel:  Recent Labs Lab 04/05/16 1126 04/08/16 1856 04/08/16 1903 04/09/16 0130 04/10/16 0840 04/11/16 0536  NA 132* 129* 130* 132* 132* 134*  K 4.7 4.0 4.0 4.8 3.8 4.2  CL 94* 94* 93* 96* 101 101  CO2 24 27  --  26 25 24   GLUCOSE 89 114* 119* 115* 97 94  BUN 6*  7 4* 6 <5* 7  CREATININE 0.56* 0.55* 0.50* 0.64 0.44* 0.55*  CALCIUM 8.7 9.0  --  9.3 8.5* 9.0   Liver Function Tests:  Recent Labs Lab 04/05/16 1126 04/09/16 0130 04/10/16 0840  AST 18 57* 28  ALT 17 33 27  ALKPHOS 199* 263* 204*  BILITOT 0.3 1.3* 0.5  PROT 6.7 9.0* 6.2*  ALBUMIN 2.4* 2.3* 1.6*   No results for input(s): LIPASE, AMYLASE in the last 168 hours. No results for input(s): AMMONIA in the last 168 hours. CBC:  Recent Labs Lab 04/05/16 1126 04/08/16 1856 04/08/16 1903 04/09/16 0130 04/10/16 0840 04/10/16 1140 04/11/16 0536  WBC 21.5* 30.7*  --  21.0* 21.1*  --  22.8*  NEUTROABS 19,350* 27.7*  --  19.5* 19.0*  --  20.7*  HGB 7.0* 7.1* 6.1* 10.3* 7.2* 8.7* 7.9*  HCT 22.7* 21.0* 18.0* 32.2* 21.6* 26.2* 24.9*  MCV 80.5 78.7  --  79.9 80.0  --  79.8  PLT 843* 623*  --  650* 547*  --  568*   Cardiac Enzymes:  Recent Labs Lab 04/09/16 0600  TROPONINI 0.04*   BNP: Invalid input(s): POCBNP CBG: No results for input(s): GLUCAP in the last 168 hours. D-Dimer No results for input(s): DDIMER in the last 72 hours. Hgb A1c No results for input(s): HGBA1C in the last 72 hours. Lipid Profile No results for input(s): CHOL, HDL, LDLCALC, TRIG,  CHOLHDL, LDLDIRECT in the last 72 hours. Thyroid function studies No results for input(s): TSH, T4TOTAL, T3FREE, THYROIDAB in the last 72 hours.  Invalid input(s): FREET3 Anemia work up No results for input(s): VITAMINB12, FOLATE, FERRITIN, TIBC, IRON, RETICCTPCT in the last 72 hours. Urinalysis    Component Value Date/Time   COLORURINE YELLOW 04/08/2016 2258   APPEARANCEUR CLEAR 04/08/2016 2258   LABSPEC >1.046 (H) 04/08/2016 2258   PHURINE 7.0 04/08/2016 2258   GLUCOSEU NEGATIVE 04/08/2016 2258   HGBUR NEGATIVE 04/08/2016 2258   BILIRUBINUR NEGATIVE 04/08/2016 2258   KETONESUR NEGATIVE 04/08/2016 2258   PROTEINUR NEGATIVE 04/08/2016 2258   UROBILINOGEN 1.0 02/20/2012 1200   NITRITE NEGATIVE 04/08/2016 2258   LEUKOCYTESUR NEGATIVE 04/08/2016 2258   Sepsis Labs Invalid input(s): PROCALCITONIN,  WBC,  LACTICIDVEN Microbiology Recent Results (from the past 240 hour(s))  Urine culture     Status: None   Collection Time: 04/08/16 10:58 PM  Result Value Ref Range Status   Specimen Description URINE, CLEAN CATCH  Final   Special Requests NONE  Final   Culture   Final    NO GROWTH Performed at [facility], [place] [address] [place], [place] [address]    Report Status 04/10/2016 FINAL  Final  Blood culture (routine x 2)     Status: None (Preliminary result)   Collection Time: 04/09/16  1:23 AM  Result Value Ref Range Status   Specimen Description BLOOD LEFT FOREARM  Final   Special Requests IN PEDIATRIC BOTTLE 2ML  Final   Culture   Final    NO GROWTH 1 DAY Performed at [facility], [place] [address] [place], [place] [address]    Report Status PENDING  Incomplete  Blood culture (routine x 2)     Status: None (Preliminary result)   Collection Time: 04/09/16  1:23 AM  Result Value Ref Range Status   Specimen Description BLOOD LEFT HAND  Final   Special Requests IN PEDIATRIC BOTTLE 2ML  Final   Culture   Final    NO GROWTH 1 DAY  Performed at [place], [place] [address] [place], [place] [address]    Report Status PENDING  Incomplete     Time coordinating discharge: 45 minutes  SIGNED:   Tawni Millers, MD  [facility] 04/11/2016, 2:33 PM Pager   If 7PM-7AM, please contact night-coverage [email or web address] Password [email or web address]

## 2016-04-11 NOTE — Progress Notes (Signed)
Patient transfused on Friday 04/08/16.

## 2016-04-11 NOTE — Progress Notes (Signed)
Order in for discharge radiation. Patient back after radiation and stated he wants his diet changed and wants to talk to the MD about discharge, Dr. Cathlean Sauer notified came and talk to patient and patient wanted to eat first, MD stated after patient eats and feels okay he can be discharged. Patient waiting on food.

## 2016-04-12 ENCOUNTER — Ambulatory Visit
Admission: RE | Admit: 2016-04-12 | Discharge: 2016-04-12 | Disposition: A | Discharge status: Home / Self Care | Payer: Medicaid Other | Source: Ambulatory Visit | Attending: Radiation Oncology | Admitting: Radiation Oncology

## 2016-04-12 ENCOUNTER — Inpatient Hospital Stay (HOSPITAL_COMMUNITY): Payer: Medicaid Other

## 2016-04-12 LAB — CREATININE, SERUM: CREATININE: 0.42 mg/dL — AB (ref 0.61–1.24)

## 2016-04-12 MED ORDER — PANTOPRAZOLE SODIUM 40 MG PO TBEC
40.0000 mg | DELAYED_RELEASE_TABLET | Freq: Two times a day (BID) | ORAL | Status: DC
  Administered 2016-04-12 – 2016-04-18 (×10): 40 mg via ORAL
  Filled 2016-04-12 (×11): qty 1

## 2016-04-12 MED ORDER — IOPAMIDOL (ISOVUE-300) INJECTION 61%
100.0000 mL | Freq: Once | INTRAVENOUS | Status: AC | PRN
  Administered 2016-04-12: 100 mL via INTRAVENOUS

## 2016-04-12 NOTE — Progress Notes (Signed)
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  CBCT/MVCT images/Port film x-rays were reviewed.  The chart was checked. Inpatient now for fever of unknown origin and chest pain workup.  CT chest angio showed nodules in lungs of unclear etiology, possibly mets; incidental liver lesions concerning for mets.  Patient was not aware of these results.  Here with mother. No new complaints  Physical Findings:  Vitals:   04/11/16 2025 04/12/16 0618  BP: (!) 99/57 (!) 109/59  Pulse: (!) 105 (!) 106  Resp: 18 19  Temp: 98.4 F (36.9 C) 98.6 F (37 C)    Wt Readings from Last 3 Encounters:  04/08/16 143 lb (64.9 kg)  04/05/16 143 lb 9.6 oz (65.1 kg)  04/04/16 141 lb 6.4 oz (64.1 kg)   Sitting upright in gurney.  Large posterior neck mass is necrotic, not shrinking significantly.  Skin around ulcerated mass is hyperpigmented, dry over neck.    CBC    Component Value Date/Time   WBC 22.8 (H) 04/11/2016 0536   RBC 3.12 (L) 04/11/2016 0536   HGB 7.9 (L) 04/11/2016 0536   HGB 8.3 (L) 03/21/2016 1555   HCT 24.9 (L) 04/11/2016 0536   HCT 24.8 (L) 03/21/2016 1555   PLT 568 (H) 04/11/2016 0536   PLT 880 (H) 03/21/2016 1555   MCV 79.8 04/11/2016 0536   MCV 78.5 (L) 03/21/2016 1555   MCH 25.3 (L) 04/11/2016 0536   MCHC 31.7 04/11/2016 0536   RDW 18.2 (H) 04/11/2016 0536   RDW 15.8 (H) 03/21/2016 1555   LYMPHSABS 0.5 (L) 04/11/2016 0536   LYMPHSABS 1.3 03/21/2016 1555   MONOABS 1.3 (H) 04/11/2016 0536   MONOABS 2.9 (H) 03/21/2016 1555   EOSABS 0.3 04/11/2016 0536   EOSABS 0.1 03/21/2016 1555   BASOSABS 0.0 04/11/2016 0536   BASOSABS 0.1 03/21/2016 1555     CMP     Component Value Date/Time   NA 134 (L) 04/11/2016 0536   NA 135 (L) 03/21/2016 1555   K 4.2 04/11/2016 0536   K 4.0  03/21/2016 1555   CL 101 04/11/2016 0536   CO2 24 04/11/2016 0536   CO2 25 03/21/2016 1555   GLUCOSE 94 04/11/2016 0536   GLUCOSE 111 03/21/2016 1555   BUN 7 04/11/2016 0536   BUN 11.4 03/21/2016 1555   CREATININE 0.42 (L) 04/12/2016 0455   CREATININE 0.56 (L) 04/05/2016 1126   CREATININE 0.8 03/21/2016 1555   CALCIUM 9.0 04/11/2016 0536   CALCIUM 9.9 03/21/2016 1555   PROT 6.2 (L) 04/10/2016 0840   PROT 8.7 (H) 03/21/2016 1555   ALBUMIN 1.6 (L) 04/10/2016 0840   ALBUMIN 2.1 (L) 03/21/2016 1555   AST 28 04/10/2016 0840   AST 24 03/21/2016 1555   ALT 27 04/10/2016 0840   ALT 23 03/21/2016 1555   ALKPHOS 204 (H) 04/10/2016 0840   ALKPHOS 275 (H) 03/21/2016 1555   BILITOT 0.5 04/10/2016 0840   BILITOT 0.41 03/21/2016 1555   GFRNONAA >60 04/12/2016 0455   GFRNONAA >89 04/05/2016 1126   GFRAA >60 04/12/2016 0455   GFRAA >89 04/05/2016 1126    Impression:  The patient is tolerating radiotherapy.   Plan:  Continue radiotherapy as planned.     I talked about his case  with radiology. Mets favored  particularly in liver but they suggested liver protocol CT or MRI for better quality imaging. I'll order CT and hopefully scan will be done before he sees med/onc next week. Pt/mother somewhat shaken by this news but they took it well.   -----------------------------------  Eppie Gibson, MD

## 2016-04-12 NOTE — Care Management Note (Signed)
Case Management Note  Patient Details  Name: Johnny Mooney MRN: [id number] Date of Birth: 1970-07-05  Subjective/Objective:45 y/o m admitted w/fever. From home. No CM needs.                    Action/Plan:d/c home.   Expected Discharge Date:               Expected Discharge Plan:  Home/Self Care  In-House Referral:     Discharge planning Services  CM Consult  Post Acute Care Choice:    Choice offered to:     DME Arranged:    DME Agency:     HH Arranged:    [place] Agency:     Status of Service:  Completed, signed off  If discussed at [employer] of Stay Meetings, dates discussed:    Additional Comments:  Dessa Phi, RN 04/12/2016, 11:10 AM

## 2016-04-12 NOTE — Progress Notes (Addendum)
PROGRESS NOTE    Johnny Mooney  YQI:[id number] DOB: 1970/04/30 DOA: 04/08/2016 PCP: Arnoldo Morale, MD    Brief Narrative:  This is a 46 year old male who presented to the hospital with the chief complaint of chest pain. 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 He developed fever while receiving blood transfusion on the day of admission, apparently low-grade fevers at home for the past few days prior. On initial physical examination his blood pressure was 107/68, heart rate 110, respiratory rate 14, temperature 100, oxygen saturation 98%. His mucous membranes were moist, his lungs were clear to auscultation bilaterally, heart S1-S2 present tachycardic, his abdomen was soft nontender, large lesion on his posterior neck scalp occipital area. Sodium 129, potassium 4.0, chloride 94, bicarbonate 27, glucose 114, BUN 7, creatinine 0.55, white count 30.7, hemoglobin 7.1, hematocrit 21.0, chest x-ray negative for infiltrates. CT chest negative for pulmonary embolism, multiple small scattered bilateral pulmonary nodules measuring up to 6 mm, reactive or metastatic disease. Multiple hepatic hypodense lesions most consistent with metastatic disease. Urinalysis negative for infection. EKG was sinus tachycardia.  The patient was admitted to the hospital working diagnosis of fever of undetermined origin rule out sepsis.   Assessment & Plan:   Principal Problem:   Fever of undetermined origin Active Problems:   Squamous cell carcinoma of neck   Chronic anemia   Hyponatremia   Leukocytosis   1. Gastritis. Patient able to tolerate regular diet, will continue antiacid therapy with protonix and sucralfate, as needed zofran and will dc naproxen. Continue pain control per oncology clinic. Will plan to discharge patient after radiation.   2. . 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 Will continue radiation therapy, will get CT liver with  contrast per oncology.   Late entry: discharge has been cancelled, liver CT with multiple worsening lesions possible rapidly progressive hepatic metastatic disease but also possibility of infectious process . Will consult IR for possible sampling of liver lesions. CANCEL DISCHARGE.   DVT prophylaxis:enoxaparin  Code Status:full  Family Communication:No family at the bedside  Disposition Plan:home   Consultants:     Procedures:   Antimicrobials:   Subjective: Patient feeling better. He was able to tolerate regular diet. No further nausea or vomiting.   Objective: Vitals:   04/11/16 0535 04/11/16 1336 04/11/16 2025 04/12/16 0618  BP: 118/73 96/60 (!) 99/57 (!) 109/59  Pulse: (!) 109 (!) 115 (!) 105 (!) 106  Resp: 19 18 18 19   Temp: 99.3 F (37.4 C) 98.9 F (37.2 C) 98.4 F (36.9 C) 98.6 F (37 C)  TempSrc: Oral Oral Oral Oral  SpO2: 100% 99% 97% 96%  Weight:      Height:        Intake/Output Summary (Last 24 hours) at 04/12/16 1106 Last data filed at 04/11/16 1124  Gross per 24 hour  Intake                0 ml  Output              550 ml  Net             -550 ml   Filed Weights   04/08/16 1723  Weight: 64.9 kg (143 lb)    Examination:  General exam: deconditioned E ENT: no pallor or icterus. Oral mucosa moist.  Respiratory system: Clear to auscultation. Respiratory effort normal. Cardiovascular system: S1 & S2 heard, RRR. No JVD, murmurs, rubs, gallops or clicks. No pedal  edema. Gastrointestinal system: Abdomen is nondistended, soft and nontender. No organomegaly or masses felt. Normal bowel sounds heard. Central nervous system: Alert and oriented. No focal neurological deficits. Extremities: Symmetric 5 x 5 power. Skin: No rashes, lesions or ulcers. Large ulcerated lesion on the posterior neck and scalp, dressing in place.       Data Reviewed: I have personally reviewed following labs and imaging studies  CBC:  Recent Labs Lab 04/05/16 1126  04/08/16 1856 04/08/16 1903 04/09/16 0130 04/10/16 0840 04/10/16 1140 04/11/16 0536  WBC 21.5* 30.7*  --  21.0* 21.1*  --  22.8*  NEUTROABS 19,350* 27.7*  --  19.5* 19.0*  --  20.7*  HGB 7.0* 7.1* 6.1* 10.3* 7.2* 8.7* 7.9*  HCT 22.7* 21.0* 18.0* 32.2* 21.6* 26.2* 24.9*  MCV 80.5 78.7  --  79.9 80.0  --  79.8  PLT 843* 623*  --  650* 547*  --  789*   Basic Metabolic Panel:  Recent Labs Lab 04/05/16 1126  04/08/16 1856 04/08/16 1903 04/09/16 0130 04/10/16 0840 04/11/16 0536 04/12/16 0455  NA 132*  --  129* 130* 132* 132* 134*  --   K 4.7  --  4.0 4.0 4.8 3.8 4.2  --   CL 94*  --  94* 93* 96* 101 101  --   CO2 24  --  27  --  26 25 24   --   GLUCOSE 89  --  114* 119* 115* 97 94  --   BUN 6*  --  7 4* 6 <5* 7  --   CREATININE 0.56*  < > 0.55* 0.50* 0.64 0.44* 0.55* 0.42*  CALCIUM 8.7  --  9.0  --  9.3 8.5* 9.0  --   < > = values in this interval not displayed. GFR: Estimated Creatinine Clearance: 109.3 mL/min (A) (by C-G formula based on SCr of 0.42 mg/dL (L)). Liver Function Tests:  Recent Labs Lab 04/05/16 1126 04/09/16 0130 04/10/16 0840  AST 18 57* 28  ALT 17 33 27  ALKPHOS 199* 263* 204*  BILITOT 0.3 1.3* 0.5  PROT 6.7 9.0* 6.2*  ALBUMIN 2.4* 2.3* 1.6*   No results for input(s): LIPASE, AMYLASE in the last 168 hours. No results for input(s): AMMONIA in the last 168 hours. Coagulation Profile: No results for input(s): INR, PROTIME in the last 168 hours. Cardiac Enzymes:  Recent Labs Lab 04/09/16 0600  TROPONINI 0.04*   BNP (last 3 results) No results for input(s): PROBNP in the last 8760 hours. HbA1C: No results for input(s): HGBA1C in the last 72 hours. CBG: No results for input(s): GLUCAP in the last 168 hours. Lipid Profile: No results for input(s): CHOL, HDL, LDLCALC, TRIG, CHOLHDL, LDLDIRECT in the last 72 hours. Thyroid Function Tests: No results for input(s): TSH, T4TOTAL, FREET4, T3FREE, THYROIDAB in the last 72 hours. Anemia Panel: No  results for input(s): VITAMINB12, FOLATE, FERRITIN, TIBC, IRON, RETICCTPCT in the last 72 hours. Sepsis Labs:  Recent Labs Lab 04/08/16 2237  LATICACIDVEN 0.98    Recent Results (from the past 240 hour(s))  Urine culture     Status: None   Collection Time: 04/08/16 10:58 PM  Result Value Ref Range Status   Specimen Description URINE, CLEAN CATCH  Final   Special Requests NONE  Final   Culture   Final    NO GROWTH Performed at [facility], [address], [place], [place] [address]    Report Status 04/10/2016 FINAL  Final  Blood culture (  routine x 2)     Status: None (Preliminary result)   Collection Time: 04/09/16  1:23 AM  Result Value Ref Range Status   Specimen Description BLOOD LEFT FOREARM  Final   Special Requests IN PEDIATRIC BOTTLE 2ML  Final   Culture   Final    NO GROWTH 2 DAYS Performed at [facility], [place] [address] [place], [place] [address]    Report Status PENDING  Incomplete  Blood culture (routine x 2)     Status: None (Preliminary result)   Collection Time: 04/09/16  1:23 AM  Result Value Ref Range Status   Specimen Description BLOOD LEFT HAND  Final   Special Requests IN PEDIATRIC BOTTLE 2ML  Final   Culture   Final    NO GROWTH 2 DAYS Performed at [facility], [place] [address], [place], [place] [address]    Report Status PENDING  Incomplete         Radiology Studies: No results found.      Scheduled Meds: . feeding supplement (ENSURE ENLIVE)  237 mL Oral BID BM  . gi cocktail  30 mL Oral Once  .  HYDROmorphone (DILAUDID) injection  1 mg Intravenous Once  . magnesium chloride  2 tablet Oral BID  . pantoprazole  40 mg Oral BID  . polyethylene glycol  17 g Oral Daily  . senna  1 tablet Oral QHS  . sodium chloride flush  3 mL Intravenous Q12H  . sucralfate  1 g Oral TID WC & HS   Continuous Infusions:   LOS: 4 days       Mauricio Gerome Apley, MD [facility] Pager [phone]  If 7PM-7AM, please  contact night-coverage [email or web address] Password [email or web address] 04/12/2016, 11:06 AM

## 2016-04-13 ENCOUNTER — Ambulatory Visit
Admission: RE | Admit: 2016-04-13 | Discharge: 2016-04-13 | Disposition: A | Discharge status: Home / Self Care | Payer: Medicaid Other | Source: Ambulatory Visit | Attending: Radiation Oncology | Admitting: Radiation Oncology

## 2016-04-13 ENCOUNTER — Inpatient Hospital Stay (HOSPITAL_COMMUNITY): Payer: Medicaid Other

## 2016-04-13 DIAGNOSIS — C779 Secondary and unspecified malignant neoplasm of lymph node, unspecified: Secondary | ICD-10-CM

## 2016-04-13 DIAGNOSIS — C4442 Squamous cell carcinoma of skin of scalp and neck: Secondary | ICD-10-CM

## 2016-04-13 DIAGNOSIS — C787 Secondary malignant neoplasm of liver and intrahepatic bile duct: Principal | ICD-10-CM

## 2016-04-13 DIAGNOSIS — C7800 Secondary malignant neoplasm of unspecified lung: Secondary | ICD-10-CM

## 2016-04-13 DIAGNOSIS — R0789 Other chest pain: Secondary | ICD-10-CM

## 2016-04-13 DIAGNOSIS — C792 Secondary malignant neoplasm of skin: Secondary | ICD-10-CM

## 2016-04-13 LAB — COMPREHENSIVE METABOLIC PANEL
ALBUMIN: 1.7 g/dL — AB (ref 3.5–5.0)
ALT: 25 U/L (ref 17–63)
ANION GAP: 6 (ref 5–15)
AST: 20 U/L (ref 15–41)
Alkaline Phosphatase: 218 U/L — ABNORMAL HIGH (ref 38–126)
BILIRUBIN TOTAL: 0.3 mg/dL (ref 0.3–1.2)
CALCIUM: 9.8 mg/dL (ref 8.9–10.3)
CO2: 28 mmol/L (ref 22–32)
Chloride: 99 mmol/L — ABNORMAL LOW (ref 101–111)
Creatinine, Ser: 0.42 mg/dL — ABNORMAL LOW (ref 0.61–1.24)
GFR calc Af Amer: >60 mL/min (ref >60)
GLUCOSE: 100 mg/dL — AB (ref 65–99)
POTASSIUM: 4.3 mmol/L (ref 3.5–5.1)
Sodium: 133 mmol/L — ABNORMAL LOW (ref 135–145)
TOTAL PROTEIN: 6.3 g/dL — AB (ref 6.5–8.1)

## 2016-04-13 LAB — CBC WITH DIFFERENTIAL/PLATELET
BASOS PCT: 0 %
Basophils Absolute: 0 10*3/uL (ref 0.0–0.1)
Eosinophils Absolute: 0.2 10*3/uL (ref 0.0–0.7)
Eosinophils Relative: 1 %
HEMATOCRIT: 24.3 % — AB (ref 39.0–52.0)
Hemoglobin: 7.9 g/dL — ABNORMAL LOW (ref 13.0–17.0)
Lymphocytes Relative: 3 %
Lymphs Abs: 0.6 10*3/uL — ABNORMAL LOW (ref 0.7–4.0)
MCH: 26 pg (ref 26.0–34.0)
MCHC: 32.5 g/dL (ref 30.0–36.0)
MCV: 79.9 fL (ref 78.0–100.0)
MONO ABS: 2.1 10*3/uL — AB (ref 0.1–1.0)
MONOS PCT: 9 %
Neutro Abs: 19.7 10*3/uL — ABNORMAL HIGH (ref 1.7–7.7)
Neutrophils Relative %: 87 %
Platelets: 651 10*3/uL — ABNORMAL HIGH (ref 150–400)
RBC: 3.04 MIL/uL — ABNORMAL LOW (ref 4.22–5.81)
RDW: 19.1 % — AB (ref 11.5–15.5)
WBC: 22.6 10*3/uL — AB (ref 4.0–10.5)

## 2016-04-13 LAB — PROTIME-INR
INR: 1.3
Prothrombin Time: 16.3 seconds — ABNORMAL HIGH (ref 11.4–15.2)

## 2016-04-13 MED ORDER — MIDAZOLAM HCL 2 MG/2ML IJ SOLN
INTRAMUSCULAR | Status: AC | PRN
  Administered 2016-04-13 (×2): 1 mg via INTRAVENOUS
  Administered 2016-04-13: 0.5 mg via INTRAVENOUS

## 2016-04-13 MED ORDER — FENTANYL CITRATE (PF) 100 MCG/2ML IJ SOLN
INTRAMUSCULAR | Status: AC | PRN
Start: 2016-04-13 — End: 2016-04-13
  Administered 2016-04-13 (×3): 25 ug via INTRAVENOUS

## 2016-04-13 MED ORDER — FENTANYL CITRATE (PF) 100 MCG/2ML IJ SOLN
INTRAMUSCULAR | Status: AC
  Filled 2016-04-13: qty 6

## 2016-04-13 MED ORDER — MIDAZOLAM HCL 2 MG/2ML IJ SOLN
INTRAMUSCULAR | Status: AC
  Filled 2016-04-13: qty 6

## 2016-04-13 NOTE — Consult Note (Signed)
Chief Complaint: Patient was seen in consultation today for image guided liver lesion aspiration/biopsy Chief Complaint  Patient presents with  . Chest Pain  . Cancer    Referring Physician(s): Arrien,M/ Natale Lay  Supervising Physician: Jacqulynn Cadet  Patient Status: [facility] - In-pt  History of Present Illness: Johnny Mooney is a 46 y.o. 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 He was  admitted to the hospital on 3/16 with chest pain/fever following blood transfusion. Patient had been having low-grade fevers prior to admission however. CT image of the chest was negative for PE but revealed lung nodules and liver lesions. CT of the abdomen performed on 3/20 revealed:  1. Numerous scattered hepatic hypodense lesions with thick enhancing margins are present in the liver. No cluster sign or double target sign. These have enlarged eating compared to the exam from 4 days ago. Possibilities include bacterial or fungal abscesses, or rapidly progressive hepatic metastatic disease. 2. A small right adrenal mass is new compared to January and probably metastatic. 3. Pulmonary nodules in the lung bases are new compared to January 2018 and likely metastatic. 4. Small pericardial effusion and small right pleural effusion. 5.  Prominent stool throughout the colon favors constipation. 6. Mild right foraminal stenosis at L5-S1 due to spurring  Patient also has leukocytosis, continued low-grade temperature elevations and sweats. Blood and  urine cultures are negative to date. Request now received for image guided liver lesion aspiration /biopsy to rule out infection versus malignancy.  Past Medical History:  Diagnosis Date  . Cancer ([place]) 11/2015   sq cell skin  . Cellulitis of left lower extremity    dx 09/ 2017 at ED took antibiotic per is healing  . History of traumatic head injury    closed -- no residual  . Subcutaneous mass of head    RIGHT POSTERIOR SCALP--  I&D at ED 09-16-2015 and 10-10-2015    Past Surgical History:  Procedure Laterality Date  . EXCISION MASS HEAD N/A 11/06/2015   Procedure: EXCISION MASS HEAD;  Surgeon: Mickeal Skinner, MD;  Location: [facility];  Service: General;  Laterality: N/A;  . LYMPH NODE BIOPSY N/A 01/07/2016   Procedure: EXCISIONAL LYMPH NODE BIOPSY;  Surgeon: [name], MD;  Location: [facility];  Service: General;  Laterality: N/A;  . MASS EXCISION N/A 01/07/2016   Procedure: EXCISION OF SCALP MASS;  Surgeon: [name], MD;  Location: [facility];  Service: General;  Laterality: N/A;    Allergies: Patient has no known allergies.  Medications: Prior to Admission medications   Medication Sig Start Date End Date Taking? Authorizing Provider  lidocaine (XYLOCAINE) 2 % solution Pharmacy: Mix 50:50 with water.  Patient: Swish and swallow 2mL of this mixture, 38min before meals and at bedtime, up to QID. 03/21/16  Yes Eppie Gibson, MD  magic mouthwash SOLN Take 5 mLs by mouth 3 (three) times daily as needed for mouth pain. 03/07/16  Yes Mariel Aloe, MD  magnesium chloride (SLOW-MAG) 64 MG TBEC SR tablet Take 2 tablets (128 mg total) by mouth 2 (two) times daily. 03/28/16  Yes Eppie Gibson, MD  naproxen (NAPROSYN) 500 MG tablet Take 1 tablet (500 mg total) by mouth 2 (two) times daily with a meal. 03/28/16  Yes Eppie Gibson, MD  oxyCODONE-acetaminophen (PERCOCET/ROXICET) 5-325 MG tablet Take 1 tablet by mouth every 4 (four) hours as needed for severe pain. 03/28/16  Yes Eppie Gibson,  MD  senna (SENOKOT) 8.6 MG TABS tablet Take 1 tablet (8.6 mg total) by mouth at bedtime. 03/28/16  Yes Eppie Gibson, MD  terbinafine (LAMISIL) 1 % cream Apply topically 2 (two) times daily. Use for up to 2 weeks. 03/07/16  Yes Mariel Aloe, MD  feeding supplement, ENSURE ENLIVE, (ENSURE ENLIVE) LIQD Take 237 mLs by mouth 2 (two)  times daily between meals. 03/08/16   Mariel Aloe, MD  ondansetron (ZOFRAN-ODT) 4 MG disintegrating tablet Take 1 tablet (4 mg total) by mouth every 8 (eight) hours as needed for nausea. 04/11/16   Mauricio Gerome Apley, MD  pantoprazole (PROTONIX) 40 MG tablet Take 1 tablet (40 mg total) by mouth 2 (two) times daily. 04/11/16   Mauricio Gerome Apley, MD  polyethylene glycol (MIRALAX / Floria Raveling) packet Take 17 g by mouth daily. 04/11/16   Mauricio Gerome Apley, MD  sucralfate (CARAFATE) 1 g tablet Take 1 tablet (1 g total) by mouth 4 (four) times daily -  with meals and at bedtime. 04/11/16 05/11/16  Mauricio Gerome Apley, MD     Family History  Problem Relation Age of Onset  . Diabetes Mellitus II Mother   . Asthma Father     Social History   Social History  . Marital status: Single    Spouse name: N/A  . Number of children: N/A  . Years of education: N/A   Social History Main Topics  . Smoking status: Current Every Day Smoker    Packs/day: 0.33    Years: 5.00    Types: Cigars  . Smokeless tobacco: Never Used     Comment: quit cigarette smoking 2013, currently smokes 2 cigars daily  . Alcohol use No  . Drug use: No     Comment: former use of marijuana  . Sexual activity: Not Asked   Other Topics Concern  . None   Social History Narrative  . None      Review of Systems see above; patient currently denies headache, chest pain, worsening dyspnea, cough, worsening abdominal pain or back pain, nausea or vomiting or bleeding. He does have some soreness at right posterior neck/occipital mass  Vital Signs: BP (!) 105/49 (BP Location: Left Arm)   Pulse (!) 117   Temp (!) 100.5 F (38.1 C) (Oral)   Resp (!) 22   Ht 5\' 10"  (1.778 m)   Wt 143 lb (64.9 kg)   SpO2 94%   BMI 20.52 kg/m   Physical Exam awake but with very flat affect; speaks very little when questioned; large right posterior neck/occipital region mass with bandage in place ;chest with slightly diminished  breath sounds right base, left clear. Heart with slightly tachycardic but regular rhythm; abdomen soft, positive bowel sounds, currently nontender; no lower extremity edema  Mallampati Score:     Imaging: Dg Chest 2 View  Result Date: 04/08/2016 CLINICAL DATA:  Chest pain EXAM: CHEST  2 VIEW COMPARISON:  02/27/2016 FINDINGS: The heart size and mediastinal contours are within normal limits. Both lungs are clear. The visualized skeletal structures are unremarkable. Probable linear artifacts on the lateral view posteriorly. IMPRESSION: No active cardiopulmonary disease. Electronically Signed   By: Donavan Foil M.D.   On: 04/08/2016 18:24   Ct Angio Chest Pe W And/or Wo Contrast  Result Date: 04/08/2016 CLINICAL DATA:  46 year old male with chest pain. Evaluate for PE. History of squamous cell carcinoma of the skin. EXAM: CT ANGIOGRAPHY CHEST WITH CONTRAST TECHNIQUE: Multidetector CT imaging of the chest was performed  using the standard protocol during bolus administration of intravenous contrast. Multiplanar CT image reconstructions and MIPs were obtained to evaluate the vascular anatomy. CONTRAST:  100 cc Isovue 370 COMPARISON:  Chest radiograph dated 04/08/2016 FINDINGS: Cardiovascular: There is no cardiomegaly or pericardial effusion. The thoracic aorta appears unremarkable. The origins of the great vessels of the aortic arch appear patent. There is mild dilatation of the main pulmonary trunk suggestive of mild underlying pulmonary hypertension. There is no CT evidence of pulmonary embolism. Mediastinum/Nodes: There is no hilar or mediastinal adenopathy. There is mild thickened appearance of the esophagus which may be related to underdistention. Clinical correlation is recommended to evaluate for esophagitis. The thyroid gland is grossly unremarkable. Lungs/Pleura: There are multiple small scattered bilateral pulmonary nodules measuring up to 6 mm in the left lower lobe (series 11, image 45). There is  no focal consolidation, pleural effusion, or pneumothorax. The central airways are patent. Upper Abdomen: Multiple low attenuating liver lesions noted measuring up to 18 x 16 mm compatible with metastatic disease in this patient with known metastatic squamous cell carcinoma. The visualized upper abdomen is otherwise unremarkable. Musculoskeletal: There is loss of subcutaneous fat. The osseous structures are intact. Review of the MIP images confirms the above findings. IMPRESSION: 1. No CT evidence of pulmonary embolism. 2. Multiple small scattered bilateral pulmonary nodules measuring up to 6 mm. These nodules may be reactive or represent metastatic disease. 3. Mildly prominent central pulmonary arteries suggestive of underlying pulmonary hypertension. 4. Multiple hepatic hypodense lesions most consistent with metastatic disease. Electronically Signed   By: Anner Crete M.D.   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 Fever. EXAM: CT ABDOMEN WITHOUT AND WITH CONTRAST TECHNIQUE: Multidetector CT imaging of the abdomen was performed following the standard protocol before and following the bolus administration of intravenous contrast. CONTRAST:  131mL ISOVUE-300 IOPAMIDOL (ISOVUE-300) INJECTION 61% COMPARISON:  Multiple exams, including CT chest from 04/08/2016 and PET-CT from 02/17/2016 FINDINGS: Lower chest: Multiple basilar pulmonary nodules are stable from 04/08/2016 but new from 02/17/2016, and likely metastatic. Small pericardial effusion. Small right pleural effusion. Mild atelectasis in the posterior basal segment right lower lobe. Hepatobiliary: Numerous scattered hepatic hypodense lesions with thick enhancing margins are present throughout all segments. Index lesion in the lateral segment left hepatic lobe measures 2.3 by 2.2 cm on image 27/10. A lesion in the dome of the left hepatic lobe has the hypodense central  portion measuring 2.0 by 2.1 cm on image 25/4, formerly 1.6 by 1.7 cm by my measurements. Subjectively the lesions appear larger than on 04/08/2016. Pancreas: Unremarkable Spleen: Unremarkable Adrenals/Urinary Tract: 1.5 by 2.5 cm right adrenal mass, precontrast density 34 Hounsfield units, portal venous phase density 84 Hounsfield units, delayed density 69 Hounsfield units, nonspecific but concerning for metastatic disease given the apparent increase from 02/17/2016. Kidneys unremarkable. Stomach/Bowel: High-density oval-shaped structures in the stomach are compatible with calcium pills or similar radiodense pills. Prominent stool throughout the colon favors constipation. Vascular/Lymphatic: No pathologic adenopathy currently identified. No portal vein or hepatic vein thrombosis identified. Other: No supplemental non-categorized findings. Musculoskeletal: Intervertebral spurring and facet spurring cause mild right foraminal stenosis at L5-S1. IMPRESSION: 1. Numerous scattered hepatic hypodense lesions with thick enhancing margins are present in the liver. No cluster sign or double target sign. These have enlarged eating compared to the exam from 4 days ago. Possibilities include bacterial or fungal abscesses, or rapidly progressive hepatic  metastatic disease. 2. A small right adrenal mass is new compared to January and probably metastatic. 3. Pulmonary nodules in the lung bases are new compared to January 2018 and likely metastatic. 4. Small pericardial effusion and small right pleural effusion. 5.  Prominent stool throughout the colon favors constipation. 6. Mild right foraminal stenosis at L5-S1 due to spurring. Electronically Signed   By: Van Clines M.D.   On: 04/12/2016 14:01    Labs:  CBC:  Recent Labs  04/09/16 0130 04/10/16 0840 04/10/16 1140 04/11/16 0536 04/13/16 0537  WBC 21.0* 21.1*  --  22.8* 22.6*  HGB 10.3* 7.2* 8.7* 7.9* 7.9*  HCT 32.2* 21.6* 26.2* 24.9* 24.3*  PLT 650* 547*   --  568* 651*    COAGS:  Recent Labs  04/13/16 0537  INR 1.30    BMP:  Recent Labs  04/09/16 0130 04/10/16 0840 04/11/16 0536 04/12/16 0455 04/13/16 0537  NA 132* 132* 134*  --  133*  K 4.8 3.8 4.2  --  4.3  CL 96* 101 101  --  99*  CO2 26 25 24   --  28  GLUCOSE 115* 97 94  --  100*  BUN 6 <5* 7  --  <5*  CALCIUM 9.3 8.5* 9.0  --  9.8  CREATININE 0.64 0.44* 0.55* 0.42* 0.42*  GFRNONAA >60 >60 >60 >60 >60  GFRAA >60 >60 >60 >60 >60    LIVER FUNCTION TESTS:  Recent Labs  04/05/16 1126 04/09/16 0130 04/10/16 0840 04/13/16 0537  BILITOT 0.3 1.3* 0.5 0.3  AST 18 57* 28 20  ALT 17 33 27 25  ALKPHOS 199* 263* 204* 218*  PROT 6.7 9.0* 6.2* 6.3*  ALBUMIN 2.4* 2.3* 1.6* 1.7*    TUMOR MARKERS: No results for input(s): AFPTM, CEA, CA199, CHROMGRNA in the last 8760 hours.  Assessment and Plan: 46 y.o. 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 He was  admitted to the hospital on 3/16 with chest pain/fever following blood transfusion. Patient had been having low-grade fevers prior to admission however. CT image of the chest was negative for PE but revealed lung nodules and liver lesions. CT of the abdomen performed on 3/20 revealed:  1. Numerous scattered hepatic hypodense lesions with thick enhancing margins are present in the liver. No cluster sign or double target sign. These have enlarged eating compared to the exam from 4 days ago. Possibilities include bacterial or fungal abscesses, or rapidly progressive hepatic metastatic disease. 2. A small right adrenal mass is new compared to January and probably metastatic. 3. Pulmonary nodules in the lung bases are new compared to January 2018 and likely metastatic. 4. Small pericardial effusion and small right pleural effusion. 5.  Prominent stool throughout the colon favors constipation. 6. Mild right foraminal stenosis at L5-S1 due  to spurring  Patient also has leukocytosis, continued low-grade temperature elevations and sweats. Blood and  urine cultures are negative to date. Request now received for image guided liver lesion aspiration /biopsy to rule out infection versus malignancy. Imaging studies have been reviewed by Dr. Laurence Ferrari and liver lesions appear amenable to aspiration/biopsy at this time but are not large enough to warrant drain placement.Risks and benefits discussed with the patient/mother including, but not limited to bleeding, infection, damage to adjacent structures or low yield requiring additional tests. All of the patient's questions were answered, patient is agreeable to proceed. Consent signed and in chart.Procedure tent planned for later today. Last  recorded temp 100.5.     Thank you for this interesting consult.  I greatly enjoyed meeting JERAL ZICK and look forward to participating in their care.  A copy of this report was sent to the requesting provider on this date.  Electronically Signed: D. Rowe Robert 04/13/2016, 12:15 PM   I spent a total of 30 minutes  in face to face in clinical consultation, greater than 50% of which was counseling/coordinating care for image guided liver lesion aspiration/biopsy

## 2016-04-13 NOTE — Consult Note (Signed)
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 male is seen at the request from [name], radiation oncologist. His appointment to see me has been rescheduled many times because the patient had recurrent admission to the hospital. I have reviewed his chart extensively and summarized as follows:   Squamous cell carcinoma of neck   11/06/2015 Pathology Results    Soft tissue mass, simple excision, posterior scalp INVASIVE SQUAMOUS CELL CARCINOMA MARGIN OF RESECTION IS POSITIVE FOR CARCINOMA (1.3 CM)      12/11/2015 Imaging    Ct head & Neck: Recently resected right occipital scalp squamous cell carcinoma. Enhancing tissue in this region extends from the skin surface to the calvarium. Correlate with histology margins as granulation tissue or residual tumor would have the same appearance. No calvarial erosion. Right occipital and posterior triangle metastatic adenopathy as described above. Cervical and submental lymph nodes are prominent but do not have the same hypervascularity and are likely reactive. Suggest mucosal exam and attention on any planned PET-CT. 2. No intracranial abnormalities.       01/07/2016 Pathology Results    1. Lymph node, biopsy, posterior - POSITIVE FOR POORLY DIFFERENTIATED SQUAMOUS CELL CARCINOMA. - SEE COMMENT. 2. Lymph node, biopsy, posterior - POSITIVE FOR POORLY DIFFERENTIATED SQUAMOUS CELL CARCINOMA. - SEE COMMENT. 3. Soft tissue mass,  simple excision, posterior scalp - BENIGN FIBROVASCULAR AND MUSCULAR SOFT TISSUE. - SCATTERED FOCI OF CHRONIC INFLAMMATION WITH GIANT CELL REACTION. - NO ATYPIA OR TUMOR SEEN. Microscopic Comment 1. and 2. Sections from the first and second specimen (both labeled posterior lymph node biopsy) both demonstrate a nodular mass of invasive poorly differentiated squamous cell carcinoma which involves benign fibrous, fibrofatty and muscular soft tissue. Almost all of the tissue is involved with tumor without lymph node identified. The findings may represent direct extension of tumor versus complete replacement of a lymph node.      02/17/2016 Imaging    CT scan neck: Scalp squamous cell carcinoma with metastatic lymph nodes. There has been remarkable progression of the suboccipital and cervical adenopathy on the right with bulky nodal mass measuring up to 10 cm and showing deep ulceration. New malignant adenopathy in the right parotid bed and left neck. 2. Stable appearance of scalp resection site. CT cannot differentiate between residual tumor and granulation tissue, reference contemporaneous PET-CT. No bone erosion or intracranial metastasis.      02/17/2016 PET scan    Occipital node/mass measuring up to 9.7 cm in long axis, maximum SUV 49.5, with cutaneous ulceration and central necrosis. Adjacent pathologic and enlarged infra auricular lymph node and faintly enlarged superficial lymph nodes on the right (superficial to the sternocleidomastoid). 2. Very faintly hypermetabolic contralateral (left-sided) level IIb lymph node measures 7 mm in short axis. 3. Diffuse hypermetabolic activity in the skeleton without CT correlate, query granulocyte stimulation. 4. No current scalp lesion or metastatic lesion to the chest,abdomen, or pelvis.      02/25/2016 -  Radiation Therapy    He started radiation only      02/25/2016 - 03/07/2016  Hospital Admission    He was admitted for management of severe hypercalcemia  due to malignancy      03/10/2016 - 03/11/2016 Hospital Admission    He was admitted for management of severe anemia      04/08/2016 [facility] Admission    He was admitted for fever, leukocytosis and possible sepsis      The patient missed his appointment on Monday.  I am now seeing him as an inpatient consult. His recent CT imaging revealed abnormal liver lesions.  CT scan of the liver yesterday confirmed evidence of liver metastasis, appears to be rapidly enlarging.  His hospital discharge cancelled  The primary service is arranging for liver biopsy today His pain appears to be adequately controlled when I saw him.  In fact, he appears somewhat sedated and is not very communicative His mother, Deneise Lever, who is present, answer most of his questions There were no reported bleeding or discharge coming from his cancer mass from the back of his head He continues to have low-grade fever with high white count He is also anemic but denies fatigue or worsening shortness of breath No reported cough  MEDICAL HISTORY:  Past Medical History:  Diagnosis Date  . 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 Cellulitis of left lower extremity    dx 09/ 2017 at ED took antibiotic per is healing  . History of traumatic head injury    closed -- no residual  . Subcutaneous mass of head    RIGHT POSTERIOR SCALP--  I&D at ED 09-16-2015 and 10-10-2015    SURGICAL HISTORY: Past Surgical History:  Procedure Laterality Date  . EXCISION MASS HEAD N/A 11/06/2015   Procedure: EXCISION MASS HEAD;  Surgeon: Mickeal Skinner, MD;  Location: [facility];  Service: General;  Laterality: N/A;  . LYMPH NODE BIOPSY N/A 01/07/2016   Procedure: EXCISIONAL LYMPH NODE BIOPSY;  Surgeon: [name], MD;  Location: [facility];  Service: General;  Laterality: N/A;  . MASS EXCISION N/A 01/07/2016   Procedure: EXCISION OF SCALP MASS;  Surgeon: [name], MD;  Location: [facility];  Service:  General;  Laterality: N/A;    SOCIAL HISTORY: Social History   Social History  . Marital status: Single    Spouse name: N/A  . Number of children: N/A  . Years of education: N/A   Occupational History  . Not on file.   Social History Main Topics  . Smoking status: Current Every Day Smoker    Packs/day: 0.33    Years: 5.00    Types: Cigars  . Smokeless tobacco: Never Used     Comment: quit cigarette smoking 2013, currently smokes 2 cigars daily  . Alcohol use No  . Drug use: No     Comment: former use of marijuana  . Sexual activity: Not on file   Other Topics Concern  . Not on file   Social History Narrative  . No narrative on file    FAMILY HISTORY: Family History  Problem Relation Age of Onset  . Diabetes Mellitus II Mother   . Asthma Father     ALLERGIES:  has No Known Allergies.  MEDICATIONS:  Current Facility-Administered Medications  Medication Dose Route Frequency Provider Last Rate Last Dose  . diphenhydrAMINE (BENADRYL) capsule 25 mg  25 mg Oral Q4H PRN Rhetta Mura Schorr, NP      . feeding supplement (ENSURE ENLIVE) (ENSURE ENLIVE) liquid 237 mL  237 mL Oral  BID BM Reubin Milan, MD   237 mL at 04/12/16 1000  . gi cocktail (Maalox,Lidocaine,Donnatal)  30 mL Oral Once Tawni Millers, MD      . HYDROmorphone (DILAUDID) injection 1 mg  1 mg Intravenous Once Deno Etienne, DO      . HYDROmorphone (DILAUDID) injection 1 mg  1 mg Intravenous Q4H PRN Tawni Millers, MD   1 mg at 04/13/16 7672  . lidocaine (XYLOCAINE) 2 % viscous mouth solution 15 mL  15 mL Mouth/Throat Q3H PRN Reubin Milan, MD      . magic mouthwash  5 mL Oral TID PRN Reubin Milan, MD      . magnesium chloride (SLOW-MAG) 64 MG SR tablet 128 mg  2 tablet Oral BID Reubin Milan, MD   128 mg at 04/12/16 2140  . ondansetron (ZOFRAN) tablet 4 mg  4 mg Oral Q6H PRN Reubin Milan, MD       Or  . ondansetron [facility]) injection 4 mg  4 mg Intravenous Q6H PRN  Reubin Milan, MD      . oxyCODONE-acetaminophen (PERCOCET/ROXICET) 5-325 MG per tablet 1 tablet  1 tablet Oral Q4H PRN Reubin Milan, MD   1 tablet at 04/12/16 1701  . pantoprazole (PROTONIX) EC tablet 40 mg  40 mg Oral BID Tawni Millers, MD   40 mg at 04/12/16 2140  . polyethylene glycol (MIRALAX / GLYCOLAX) packet 17 g  17 g Oral Daily Reubin Milan, MD   17 g at 04/12/16 1046  . senna (SENOKOT) tablet 8.6 mg  1 tablet Oral QHS Reubin Milan, MD   8.6 mg at 04/12/16 2140  . sodium chloride flush (NS) 0.9 % injection 3 mL  3 mL Intravenous Q12H Reubin Milan, MD   3 mL at 04/12/16 2140  . sucralfate (CARAFATE) tablet 1 g  1 g Oral TID WC & HS Mauricio Gerome Apley, MD   1 g at 04/12/16 2140   Facility-Administered Medications Ordered in Other Encounters  Medication Dose Route Frequency Provider Last Rate Last Dose  . HYDROcodone-acetaminophen (NORCO/VICODIN) 5-325 MG per tablet 1 tablet  1 tablet Oral Once Kyung Rudd, MD        REVIEW OF SYSTEMS:   Eyes: Denies blurriness of vision, double vision or watery eyes Ears, nose, mouth, throat, and face: Denies mucositis or sore throat Respiratory: Denies cough, dyspnea or wheezes Cardiovascular: Denies palpitation, chest discomfort or lower extremity swelling Gastrointestinal:  Denies nausea, heartburn or change in bowel habits Lymphatics: Denies new lymphadenopathy or easy bruising Neurological:Denies numbness, tingling or new weaknesses Behavioral/Psych: Mood is stable, no new changes  All other systems were reviewed with the patient and are negative.  PHYSICAL EXAMINATION: ECOG PERFORMANCE STATUS: 2 - Symptomatic, <50% confined to bed  Vitals:   04/12/16 2050 04/13/16 0500  BP: (!) 100/51 (!) 105/49  Pulse: (!) 105 (!) 117  Resp: 20 (!) 22  Temp: 98.4 F (36.9 C) (!) 100.5 F (38.1 C)   Filed Weights   04/08/16 1723  Weight: 143 lb (64.9 kg)    GENERAL:alert, no distress and comfortable.  He  looks thin SKIN: There is a large bandage at the back of his head  EYES: Eyes are closed OROPHARYNX:no exudate, no erythema and lips, buccal mucosa, and tongue normal  NECK: supple, thyroid normal size, non-tender, without nodularity LYMPH:  no palpable lymphadenopathy in the cervical, axillary or inguinal LUNGS: clear to auscultation and percussion with  normal breathing effort HEART: regular rate & rhythm and no murmurs and no lower extremity edema ABDOMEN:abdomen soft, non-tender and normal bowel sounds Musculoskeletal:no cyanosis of digits and no clubbing  PSYCH: He appears sleepy  NEURO: no focal motor/sensory deficits  LABORATORY DATA:  I have reviewed the data as listed Lab Results  Component Value Date   WBC 22.6 (H) 04/13/2016   HGB 7.9 (L) 04/13/2016   HCT 24.3 (L) 04/13/2016   MCV 79.9 04/13/2016   PLT 651 (H) 04/13/2016    Recent Labs  04/09/16 0130 04/10/16 0840 04/11/16 0536 04/12/16 0455 04/13/16 0537  NA 132* 132* 134*  --  133*  K 4.8 3.8 4.2  --  4.3  CL 96* 101 101  --  99*  CO2 26 25 24   --  28  GLUCOSE 115* 97 94  --  100*  BUN 6 <5* 7  --  <5*  CREATININE 0.64 0.44* 0.55* 0.42* 0.42*  CALCIUM 9.3 8.5* 9.0  --  9.8  GFRNONAA >60 >60 >60 >60 >60  GFRAA >60 >60 >60 >60 >60  PROT 9.0* 6.2*  --   --  6.3*  ALBUMIN 2.3* 1.6*  --   --  1.7*  AST 57* 28  --   --  20  ALT 33 27  --   --  25  ALKPHOS 263* 204*  --   --  218*  BILITOT 1.3* 0.5  --   --  0.3    RADIOGRAPHIC STUDIES: I have personally reviewed the radiological images as listed and agreed with the findings in the report. Dg Chest 2 View  Result Date: 04/08/2016 CLINICAL DATA:  Chest pain EXAM: CHEST  2 VIEW COMPARISON:  02/27/2016 FINDINGS: The heart size and mediastinal contours are within normal limits. Both lungs are clear. The visualized skeletal structures are unremarkable. Probable linear artifacts on the lateral view posteriorly. IMPRESSION: No active cardiopulmonary disease.  Electronically Signed   By: Donavan Foil M.D.   On: 04/08/2016 18:24   Ct Angio Chest Pe W And/or Wo Contrast  Result Date: 04/08/2016 CLINICAL DATA:  46 year old male with chest pain. Evaluate for PE. History of squamous cell carcinoma of the skin. EXAM: CT ANGIOGRAPHY CHEST WITH CONTRAST TECHNIQUE: Multidetector CT imaging of the chest was performed using the standard protocol during bolus administration of intravenous contrast. Multiplanar CT image reconstructions and MIPs were obtained to evaluate the vascular anatomy. CONTRAST:  100 cc Isovue 370 COMPARISON:  Chest radiograph dated 04/08/2016 FINDINGS: Cardiovascular: There is no cardiomegaly or pericardial effusion. The thoracic aorta appears unremarkable. The origins of the great vessels of the aortic arch appear patent. There is mild dilatation of the main pulmonary trunk suggestive of mild underlying pulmonary hypertension. There is no CT evidence of pulmonary embolism. Mediastinum/Nodes: There is no hilar or mediastinal adenopathy. There is mild thickened appearance of the esophagus which may be related to underdistention. Clinical correlation is recommended to evaluate for esophagitis. The thyroid gland is grossly unremarkable. Lungs/Pleura: There are multiple small scattered bilateral pulmonary nodules measuring up to 6 mm in the left lower lobe (series 11, image 45). There is no focal consolidation, pleural effusion, or pneumothorax. The central airways are patent. Upper Abdomen: Multiple low attenuating liver lesions noted measuring up to 18 x 16 mm compatible with metastatic disease in this patient with known metastatic squamous cell carcinoma. The visualized upper abdomen is otherwise unremarkable. Musculoskeletal: There is loss of subcutaneous fat. The osseous structures are intact. Review of the  MIP images confirms the above findings. IMPRESSION: 1. No CT evidence of pulmonary embolism. 2. Multiple small scattered bilateral pulmonary nodules  measuring up to 6 mm. These nodules may be reactive or represent metastatic disease. 3. Mildly prominent central pulmonary arteries suggestive of underlying pulmonary hypertension. 4. Multiple hepatic hypodense lesions most consistent with metastatic disease. Electronically Signed   By: Anner Crete M.D.   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 Fever. EXAM: CT ABDOMEN WITHOUT AND WITH CONTRAST TECHNIQUE: Multidetector CT imaging of the abdomen was performed following the standard protocol before and following the bolus administration of intravenous contrast. CONTRAST:  172mL ISOVUE-300 IOPAMIDOL (ISOVUE-300) INJECTION 61% COMPARISON:  Multiple exams, including CT chest from 04/08/2016 and PET-CT from 02/17/2016 FINDINGS: Lower chest: Multiple basilar pulmonary nodules are stable from 04/08/2016 but new from 02/17/2016, and likely metastatic. Small pericardial effusion. Small right pleural effusion. Mild atelectasis in the posterior basal segment right lower lobe. Hepatobiliary: Numerous scattered hepatic hypodense lesions with thick enhancing margins are present throughout all segments. Index lesion in the lateral segment left hepatic lobe measures 2.3 by 2.2 cm on image 27/10. A lesion in the dome of the left hepatic lobe has the hypodense central portion measuring 2.0 by 2.1 cm on image 25/4, formerly 1.6 by 1.7 cm by my measurements. Subjectively the lesions appear larger than on 04/08/2016. Pancreas: Unremarkable Spleen: Unremarkable Adrenals/Urinary Tract: 1.5 by 2.5 cm right adrenal mass, precontrast density 34 Hounsfield units, portal venous phase density 84 Hounsfield units, delayed density 69 Hounsfield units, nonspecific but concerning for metastatic disease given the apparent increase from 02/17/2016. Kidneys unremarkable. Stomach/Bowel: High-density oval-shaped structures in the stomach are compatible with  calcium pills or similar radiodense pills. Prominent stool throughout the colon favors constipation. Vascular/Lymphatic: No pathologic adenopathy currently identified. No portal vein or hepatic vein thrombosis identified. Other: No supplemental non-categorized findings. Musculoskeletal: Intervertebral spurring and facet spurring cause mild right foraminal stenosis at L5-S1. IMPRESSION: 1. Numerous scattered hepatic hypodense lesions with thick enhancing margins are present in the liver. No cluster sign or double target sign. These have enlarged eating compared to the exam from 4 days ago. Possibilities include bacterial or fungal abscesses, or rapidly progressive hepatic metastatic disease. 2. A small right adrenal mass is new compared to January and probably metastatic. 3. Pulmonary nodules in the lung bases are new compared to January 2018 and likely metastatic. 4. Small pericardial effusion and small right pleural effusion. 5.  Prominent stool throughout the colon favors constipation. 6. Mild right foraminal stenosis at L5-S1 due to spurring. Electronically Signed   By: Van Clines M.D.   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 with regional lymphadenopathy and no evidence of liver metastasis Bilateral lung nodules, likely metastatic cancer Rapidly enlarging liver metastasis Severe anemia Leukocytosis with fever of unknown origin  Severe protein calorie malnutrition  I felt that overall, the appearance of lung nodules and liver lesions are most compatible with metastatic squamous cell carcinoma, originating from the back of his head His prognosis is very poor Even though he is young without major comorbidities, his cancer is highly aggressive He will be completing radiation treatment at the end of the week Liver biopsy has been arranged I will set up outpatient follow-up appointment next week to review test results and to determine  the next step/plan of CARE  Since the patient is not much involved in today's discussion, I conveyed most of the plan above with his mother who agree with the plan of care I will coordinate outpatient appointment with the head and neck navigator I will set up a tentative appointment to see him on 04/13/2016 with blood work monitoring, assuming that he will be discharged at the end of the week Please call if questions arise   All questions were answered. The patient knows to call the clinic with any problems, questions or concerns.   Heath Lark, MD 04/13/2016 9:12 AM

## 2016-04-13 NOTE — Progress Notes (Signed)
MEDICATION-RELATED CONSULT NOTE   IR Procedure Consult - Anticoagulant/Antiplatelet PTA/Inpatient Med List Review by Pharmacist    Procedure: liver lesion biopsy    Completed: 04/13/16   Post-Procedural bleeding risk per IR MD assessment:  standard  Antithrombotic medications on inpatient or PTA profile prior to procedure:   Not on anticoagulation  Pharmacy will sign off.   Dia Sitter, PharmD, BCPS 04/13/2016 6:13 PM

## 2016-04-13 NOTE — Progress Notes (Addendum)
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 Patient was admitted for fever of unknown origin, and further workup. Patient was treated with broad-spectrum antibiotic, favored to be associated with cellulitis of neck lesion, but since patient continued to have persistent fever CT of the abdomen was done showing liver lesions concerning for infectious versus malignancy. Patient for biopsy today.   Subjective: Patient seen and examined, continues to c/o sweets and mild epigastric pain. Tolerated diet well yesterday. CT abdomen showing worsening liver lesions, for biopsy today to r/o infectious process.   Assessment & Plan: Fever of unknown origin w/ leukocytosis - ? Metastasis new active to the liver vs liver infection.  For biopsy today  Oncology recommendations appreciated   Gastritis  Patient tolerating diet well after the addition of PPI and sucralfate. Continue pain control per oncology.   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 - Continue radiation therapy per oncology team.   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  Monitor CBC  Transfuse if Hgb < 7  DVT prophylaxis: Lovenox  Code Status: FULL  Family Communication: Mother at bedside  Disposition Plan: Home when medically stable   Consultants:   Oncology   IR   Procedures:     Antimicrobials:     Objective: Vitals:   04/11/16 2025 04/12/16 0618 04/12/16 2050 04/13/16 0500  BP: (!) 99/57 (!) 109/59 (!) 100/51 (!) 105/49  Pulse: (!) 105 (!) 106 (!) 105 (!) 117  Resp: 18 19 20  (!) 22    Temp: 98.4 F (36.9 C) 98.6 F (37 C) 98.4 F (36.9 C) (!) 100.5 F (38.1 C)  TempSrc: Oral Oral Oral Oral  SpO2: 97% 96% 98% 94%  Weight:      Height:        Intake/Output Summary (Last 24 hours) at 04/13/16 1503 Last data filed at 04/13/16 0420  Gross per 24 hour  Intake                0 ml  Output              800 ml  Net             -800 ml   Filed Weights   04/08/16 1723  Weight: 64.9 kg (143 lb)    Examination:  General exam: Appears calm and comfortable HEENT: OP moist  Respiratory system: Clear to auscultation. No wheezes,crackle or rhonchi Cardiovascular system: S1 & S2 heard, RRR. No JVD, murmurs, rubs or gallops Gastrointestinal system: Abdomen soft, mild epigastric tenderness, non distended, + BS  Central nervous system: AAOx3, although slight sleepy  Extremities: No pedal edema.  Skin: Large dressing in the back of his neck  Psychiatry: Mood appropriate    Data Reviewed: I have personally reviewed following labs and imaging studies  CBC:  Recent Labs Lab 04/08/16 1856  04/09/16 0130 04/10/16 0840 04/10/16 1140 04/11/16 0536 04/13/16 0537  WBC 30.7*  --  21.0* 21.1*  --  22.8* 22.6*  NEUTROABS 27.7*  --  19.5* 19.0*  --  20.7* 19.7*  HGB 7.1*  < > 10.3* 7.2* 8.7* 7.9* 7.9*  HCT 21.0*  < > 32.2* 21.6* 26.2* 24.9* 24.3*  MCV 78.7  --  79.9 80.0  --  79.8 79.9  PLT 623*  --  650* 547*  --  568* 651*  < > = values in this interval not displayed. Basic Metabolic Panel:  Recent Labs Lab 04/08/16 1856 04/08/16 1903 04/09/16 0130 04/10/16 0840 04/11/16 0536 04/12/16 0455 04/13/16 0537  NA 129* 130* 132* 132* 134*  --  133*  K 4.0 4.0 4.8 3.8 4.2  --  4.3  CL 94* 93* 96* 101 101  --  99*  CO2 27  --  26 25 24   --  28  GLUCOSE 114* 119* 115* 97 94  --  100*  BUN 7 4* 6 <5* 7  --  <5*  CREATININE 0.55* 0.50* 0.64 0.44* 0.55* 0.42* 0.42*  CALCIUM 9.0  --  9.3 8.5* 9.0  --  9.8   GFR: Estimated Creatinine Clearance: 109.3 mL/min (A) (by C-G  formula based on SCr of 0.42 mg/dL (L)). Liver Function Tests:  Recent Labs Lab 04/09/16 0130 04/10/16 0840 04/13/16 0537  AST 57* 28 20  ALT 33 27 25  ALKPHOS 263* 204* 218*  BILITOT 1.3* 0.5 0.3  PROT 9.0* 6.2* 6.3*  ALBUMIN 2.3* 1.6* 1.7*   No results for input(s): LIPASE, AMYLASE in the last 168 hours. No results for input(s): AMMONIA in the last 168 hours. Coagulation Profile:  Recent Labs Lab 04/13/16 0537  INR 1.30   Cardiac Enzymes:  Recent Labs Lab 04/09/16 0600  TROPONINI 0.04*   BNP (last 3 results) No results for input(s): PROBNP in the last 8760 hours. HbA1C: No results for input(s): HGBA1C in the last 72 hours. CBG: No results for input(s): GLUCAP in the last 168 hours. Lipid Profile: No results for input(s): CHOL, HDL, LDLCALC, TRIG, CHOLHDL, LDLDIRECT in the last 72 hours. Thyroid Function Tests: No results for input(s): TSH, T4TOTAL, FREET4, T3FREE, THYROIDAB in the last 72 hours. Anemia Panel: No results for input(s): VITAMINB12, FOLATE, FERRITIN, TIBC, IRON, RETICCTPCT in the last 72 hours. Sepsis Labs:  Recent Labs Lab 04/08/16 2237  LATICACIDVEN 0.98    Recent Results (from the past 240 hour(s))  Urine culture     Status: None   Collection Time: 04/08/16 10:58 PM  Result Value Ref Range Status   Specimen Description URINE, CLEAN CATCH  Final   Special Requests NONE  Final   Culture   Final    NO GROWTH Performed at [facility], [address], [place], [place] [address]    Report Status 04/10/2016 FINAL  Final  Blood culture (routine x 2)     Status: None (Preliminary result)   Collection Time: 04/09/16  1:23 AM  Result Value Ref Range Status   Specimen Description BLOOD LEFT FOREARM  Final   Special Requests IN PEDIATRIC BOTTLE 2ML  Final   Culture   Final    NO GROWTH 4 DAYS Performed at [facility], [place] [address] [place], [place] [address]    Report Status PENDING  Incomplete  Blood culture (routine x 2)      Status: None (Preliminary result)   Collection Time: 04/09/16  1:23 AM  Result Value Ref Range Status   Specimen Description BLOOD LEFT HAND  Final   Special Requests IN PEDIATRIC BOTTLE 2ML  Final   Culture   Final    NO GROWTH 4 DAYS Performed at [facility], [place] [address],  [place], [place] [address]    Report Status PENDING  Incomplete     Radiology Studies: Ct Liver Abd W/wo  Result Date: 04/12/2016 CLINICAL DATA:  Metastatic squamous cell carcinoma of the neck. Fever. EXAM: CT ABDOMEN WITHOUT AND WITH CONTRAST TECHNIQUE: Multidetector CT imaging of the abdomen was performed following the standard protocol before and following the bolus administration of intravenous contrast. CONTRAST:  166mL ISOVUE-300 IOPAMIDOL (ISOVUE-300) INJECTION 61% COMPARISON:  Multiple exams, including CT chest from 04/08/2016 and PET-CT from 02/17/2016 FINDINGS: Lower chest: Multiple basilar pulmonary nodules are stable from 04/08/2016 but new from 02/17/2016, and likely metastatic. Small pericardial effusion. Small right pleural effusion. Mild atelectasis in the posterior basal segment right lower lobe. Hepatobiliary: Numerous scattered hepatic hypodense lesions with thick enhancing margins are present throughout all segments. Index lesion in the lateral segment left hepatic lobe measures 2.3 by 2.2 cm on image 27/10. A lesion in the dome of the left hepatic lobe has the hypodense central portion measuring 2.0 by 2.1 cm on image 25/4, formerly 1.6 by 1.7 cm by my measurements. Subjectively the lesions appear larger than on 04/08/2016. Pancreas: Unremarkable Spleen: Unremarkable Adrenals/Urinary Tract: 1.5 by 2.5 cm right adrenal mass, precontrast density 34 Hounsfield units, portal venous phase density 84 Hounsfield units, delayed density 69 Hounsfield units, nonspecific but concerning for metastatic disease given the apparent increase from 02/17/2016. Kidneys unremarkable. Stomach/Bowel: High-density  oval-shaped structures in the stomach are compatible with calcium pills or similar radiodense pills. Prominent stool throughout the colon favors constipation. Vascular/Lymphatic: No pathologic adenopathy currently identified. No portal vein or hepatic vein thrombosis identified. Other: No supplemental non-categorized findings. Musculoskeletal: Intervertebral spurring and facet spurring cause mild right foraminal stenosis at L5-S1. IMPRESSION: 1. Numerous scattered hepatic hypodense lesions with thick enhancing margins are present in the liver. No cluster sign or double target sign. These have enlarged eating compared to the exam from 4 days ago. Possibilities include bacterial or fungal abscesses, or rapidly progressive hepatic metastatic disease. 2. A small right adrenal mass is new compared to January and probably metastatic. 3. Pulmonary nodules in the lung bases are new compared to January 2018 and likely metastatic. 4. Small pericardial effusion and small right pleural effusion. 5.  Prominent stool throughout the colon favors constipation. 6. Mild right foraminal stenosis at L5-S1 due to spurring. Electronically Signed   By: Van Clines M.D.   On: 04/12/2016 14:01      Scheduled Meds: . feeding supplement (ENSURE ENLIVE)  237 mL Oral BID BM  . gi cocktail  30 mL Oral Once  .  HYDROmorphone (DILAUDID) injection  1 mg Intravenous Once  . magnesium chloride  2 tablet Oral BID  . pantoprazole  40 mg Oral BID  . polyethylene glycol  17 g Oral Daily  . senna  1 tablet Oral QHS  . sodium chloride flush  3 mL Intravenous Q12H  . sucralfate  1 g Oral TID WC & HS   Continuous Infusions:   LOS: 5 days    Chipper Oman, MD Pager: Text Page via [email or web address]  [phone]  If 7PM-7AM, please contact night-coverage [email or web address] Password [facility] 04/13/2016, 3:03 PM

## 2016-04-13 NOTE — Sedation Documentation (Signed)
Patient is resting comfortably. 

## 2016-04-13 NOTE — Sedation Documentation (Signed)
Patient is resting comfortably. Grimaces with pain.

## 2016-04-13 NOTE — Procedures (Signed)
Interventional Radiology Procedure Note  Procedure: US guided core biopsy of liver lesion.  Lesions appear solid by [place], these are not abscesses.  Sent for pathology and fungal stain.   Complications: None  Estimated Blood Loss: None  Recommendations: - Bedrest x 2 hrs - Path and fungal cx are pending  Signed,  Criselda Peaches, MD

## 2016-04-14 ENCOUNTER — Encounter: Payer: Medicaid Other | Admitting: Nutrition

## 2016-04-14 ENCOUNTER — Encounter: Payer: Self-pay | Admitting: *Deleted

## 2016-04-14 ENCOUNTER — Ambulatory Visit
Admission: RE | Admit: 2016-04-14 | Discharge: 2016-04-14 | Disposition: A | Discharge status: Home / Self Care | Payer: Medicaid Other | Source: Ambulatory Visit | Attending: Radiation Oncology | Admitting: Radiation Oncology

## 2016-04-14 ENCOUNTER — Inpatient Hospital Stay (HOSPITAL_COMMUNITY): Payer: Medicaid Other

## 2016-04-14 ENCOUNTER — Ambulatory Visit: Payer: Medicaid Other

## 2016-04-14 LAB — URINALYSIS, ROUTINE W REFLEX MICROSCOPIC
Bilirubin Urine: NEGATIVE
GLUCOSE, UA: NEGATIVE mg/dL
HGB URINE DIPSTICK: NEGATIVE
KETONES UR: NEGATIVE mg/dL
Leukocytes, UA: NEGATIVE
Nitrite: NEGATIVE
PROTEIN: NEGATIVE mg/dL
Specific Gravity, Urine: 1.011 (ref 1.005–1.030)
pH: 7 (ref 5.0–8.0)

## 2016-04-14 LAB — COMPREHENSIVE METABOLIC PANEL
ALT: 24 U/L (ref 17–63)
ANION GAP: 9 (ref 5–15)
AST: 26 U/L (ref 15–41)
Albumin: 1.8 g/dL — ABNORMAL LOW (ref 3.5–5.0)
Alkaline Phosphatase: 275 U/L — ABNORMAL HIGH (ref 38–126)
BUN: 7 mg/dL (ref 6–20)
CHLORIDE: 96 mmol/L — AB (ref 101–111)
CO2: 29 mmol/L (ref 22–32)
CREATININE: 0.58 mg/dL — AB (ref 0.61–1.24)
Calcium: 10.3 mg/dL (ref 8.9–10.3)
Glucose, Bld: 112 mg/dL — ABNORMAL HIGH (ref 65–99)
POTASSIUM: 4.3 mmol/L (ref 3.5–5.1)
SODIUM: 134 mmol/L — AB (ref 135–145)
Total Bilirubin: 0.6 mg/dL (ref 0.3–1.2)
Total Protein: 7.3 g/dL (ref 6.5–8.1)

## 2016-04-14 LAB — CULTURE, BLOOD (ROUTINE X 2)
CULTURE: NO GROWTH
Culture: NO GROWTH

## 2016-04-14 LAB — CBC WITH DIFFERENTIAL/PLATELET
BASOS PCT: 0 %
Basophils Absolute: 0 10*3/uL (ref 0.0–0.1)
EOS ABS: 0.1 10*3/uL (ref 0.0–0.7)
Eosinophils Relative: 0 %
HEMATOCRIT: 24.4 % — AB (ref 39.0–52.0)
Hemoglobin: 7.9 g/dL — ABNORMAL LOW (ref 13.0–17.0)
LYMPHS ABS: 0.9 10*3/uL (ref 0.7–4.0)
Lymphocytes Relative: 4 %
MCH: 25.8 pg — AB (ref 26.0–34.0)
MCHC: 32.4 g/dL (ref 30.0–36.0)
MCV: 79.7 fL (ref 78.0–100.0)
Monocytes Absolute: 2.3 10*3/uL — ABNORMAL HIGH (ref 0.1–1.0)
Monocytes Relative: 10 %
NEUTROS ABS: 18.7 10*3/uL — AB (ref 1.7–7.7)
NEUTROS PCT: 86 %
Platelets: 674 10*3/uL — ABNORMAL HIGH (ref 150–400)
RBC: 3.06 MIL/uL — ABNORMAL LOW (ref 4.22–5.81)
RDW: 19.5 % — ABNORMAL HIGH (ref 11.5–15.5)
WBC: 22 10*3/uL — AB (ref 4.0–10.5)

## 2016-04-14 MED ORDER — IOPAMIDOL (ISOVUE-300) INJECTION 61%
75.0000 mL | Freq: Once | INTRAVENOUS | Status: AC | PRN
  Administered 2016-04-14: 75 mL via INTRAVENOUS

## 2016-04-14 MED ORDER — ACETAMINOPHEN 325 MG PO TABS
650.0000 mg | ORAL_TABLET | ORAL | Status: DC | PRN
  Administered 2016-04-15 – 2016-04-18 (×6): 650 mg via ORAL
  Filled 2016-04-14 (×6): qty 2

## 2016-04-14 MED ORDER — BISACODYL 10 MG RE SUPP: 10.0000 mg | Freq: Every day | RECTAL | Status: DC | PRN

## 2016-04-14 MED ORDER — ACETAMINOPHEN 500 MG PO TABS: 1000.0000 mg | ORAL_TABLET | Freq: Once | ORAL | Status: DC

## 2016-04-14 MED ORDER — SODIUM CHLORIDE 0.9 % IV BOLUS (SEPSIS)
500.0000 mL | Freq: Once | INTRAVENOUS | Status: AC
Start: 2016-04-14 — End: 2016-04-14
  Administered 2016-04-14: 500 mL via INTRAVENOUS

## 2016-04-14 NOTE — Progress Notes (Signed)
MD paged regarding fever and tachycardia. Waiting on response. Will continue to monitor.

## 2016-04-14 NOTE — Progress Notes (Signed)
Oncology Nurse Navigator Documentation  Following radiation treatment, changed dressing to scalp tumor, wet-to-dry, secured with paper tape.  Gayleen Orem, RN, BSN, [place] [facility] Oncology Nurse [place] [facility] [place] [phone]

## 2016-04-14 NOTE — Progress Notes (Signed)
[facility] Therapy Treatment Record Phone [phone]   Radiation Therapy was administered to Johnny Mooney on: 04/14/2016  3:31 PM and was treatment # 34 out of a planned course of 35 treatments.  Radiation Treatment  1). Beam photons with 6-10 energy  2). Brachytherapy None  3). Stereotactic Radiosurgery None  4). Other Radiation None     Maxi Carreras J Shalik Sanfilippo, RT

## 2016-04-14 NOTE — Consult Note (Signed)
Consultation Note Date: 04/14/2016   Patient Name: Johnny Mooney  DOB: 1971-01-02  MRN: [id number]  Age / Sex: 46 y.o., male  PCP: Arnoldo Morale, MD Referring Physician: Doreatha Lew, MD  Reason for Consultation: Establishing goals of care  HPI/Patient Profile: 46 y.o. male  admitted on 04/08/2016    Clinical Assessment and Goals of Care:  46 yo gentleman with metastatic squamous cell carcinoma of the head and neck with regional lymphadenopathy,recently found to have bilateral lung nodules,rapidly enlarging liver metastasis. Additionally, the patient also has severe anemia, leukocytosis with fever of unknown origin, severe protein calorie malnutrition.   Patient underwent liver biopsy on 04-13-16, results pending. He has been seen by medical oncology Dr Alvy Bimler. Patient has small R adrenal mass, small pericardial effusion. He is not eating drinking much, he is sleeping more. A palliative consult has been placed for additional goals of care discussions.   The patient's primary historian is his mother, who is at the bedside, the patient does not interact much with me. Patient's mother states that his cancer was diagnosed in early 2017. 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 Patient's mother was living in [place] for several months in 2017 taking care of her mother who died in 12/06/15, around the same time as the patient was found to have a "boiled egg" sized lesion on his head. Mother states that he is her only child. There is no family history of head and neck cancers.   Patient's mother is taken aback that her son has been diagnosed with a highly aggressive cancer, now with a poor prognosis. Please see discussions below, thank you  for the consult.   HCPOA Mother, also has a 30 year old son who lives in [place], [place].    SUMMARY OF RECOMMENDATIONS/ GOALS OF CARE DISCUSSIONS:  Discussed with patient's mother present in the room, patient did not participate in the conversations.  Patient now appears to have a highly aggressive cancer, which portends a very poor prognosis. We talked about what would be important for the patient, if indeed, his time were to be limited, what the patient would want if he knew that a cure is not possible. We discussed about DNR DNI in detail. I gave his mother some brief information about a comfort focused approach to care that includes hospice services. Patient's mother wishes to discuss further with patient, her husband and the patient's 76 year old son.   Patient's mother wishes to await liver biopsy results, further discussions with medical oncology.   We talked about cancer related cachexia/anorexia, signs and symptoms at end of life. She is shocked by how rapidly the patient is declining. Offered active listening and supportive care. We will continue to follow along.   Continue current pain regimen, bowel regimen. Monitor for uncontrolled symptoms.  Code Status/Advance Care Planning:  Full code    Symptom Management:    as above   Palliative Prophylaxis:   Bowel Regimen  Psycho-social/Spiritual:   Desire for further Chaplaincy support:no  Additional Recommendations: Caregiving  Support/Resources  Prognosis:   Guarded   Discharge Planning: To Be Determined      Primary Diagnoses: Present on Admission: . Fever of undetermined origin . Chronic anemia . Squamous cell carcinoma of neck . Hyponatremia . Leukocytosis   I have reviewed the medical record, interviewed the patient and family, and examined the patient. The following aspects are pertinent.  Past Medical History:  Diagnosis Date  . 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 Cellulitis of left  lower extremity    dx 09/ 2017 at ED took antibiotic per is healing  . History of traumatic head injury    closed -- no residual  . Subcutaneous mass of head    RIGHT POSTERIOR SCALP--  I&D at ED 09-16-2015 and 10-10-2015   Social History   Social History  . Marital status: Single    Spouse name: N/A  . Number of children: N/A  . Years of education: N/A   Social History Main Topics  . Smoking status: Current Every Day Smoker    Packs/day: 0.33    Years: 5.00    Types: Cigars  . Smokeless tobacco: Never Used     Comment: quit cigarette smoking 2013, currently smokes 2 cigars daily  . Alcohol use No  . Drug use: No     Comment: former use of marijuana  . Sexual activity: Not Asked   Other Topics Concern  . None   Social History Narrative  . None   Family History  Problem Relation Age of Onset  . Diabetes Mellitus II Mother   . Asthma Father    Scheduled Meds: . feeding supplement (ENSURE ENLIVE)  237 mL Oral BID BM  . gi cocktail  30 mL Oral Once  .  HYDROmorphone (DILAUDID) injection  1 mg Intravenous Once  . magnesium chloride  2 tablet Oral BID  . pantoprazole  40 mg Oral BID  . polyethylene glycol  17 g Oral Daily  . senna  1 tablet Oral QHS  . sodium chloride flush  3 mL Intravenous Q12H  . sucralfate  1 g Oral TID WC & HS   Continuous Infusions: PRN Meds:.acetaminophen, diphenhydrAMINE, HYDROmorphone (DILAUDID) injection, lidocaine, magic mouthwash, ondansetron **OR** ondansetron (ZOFRAN) IV, oxyCODONE-acetaminophen Medications Prior to Admission:  Prior to Admission medications   Medication Sig Start Date End Date Taking? Authorizing Provider  lidocaine (XYLOCAINE) 2 % solution Pharmacy: Mix 50:50 with water.  Patient: Swish and swallow 56mL of this mixture, 66min before meals and at bedtime, up to QID. 03/21/16  Yes Eppie Gibson, MD  magic mouthwash SOLN Take 5 mLs by mouth 3 (three) times daily as needed for mouth pain. 03/07/16  Yes Mariel Aloe, MD    magnesium chloride (SLOW-MAG) 64 MG TBEC SR tablet Take 2 tablets (128 mg total) by mouth 2 (two) times daily. 03/28/16  Yes Eppie Gibson, MD  naproxen (NAPROSYN) 500 MG tablet Take 1 tablet (500 mg total) by mouth 2 (two) times daily with a meal. 03/28/16  Yes Eppie Gibson, MD  oxyCODONE-acetaminophen (PERCOCET/ROXICET) 5-325 MG tablet Take 1 tablet by mouth every 4 (four) hours as needed for severe pain. 03/28/16  Yes Eppie Gibson, MD  senna (SENOKOT) 8.6 MG TABS tablet Take 1 tablet (8.6 mg total)  by mouth at bedtime. 03/28/16  Yes Eppie Gibson, MD  terbinafine (LAMISIL) 1 % cream Apply topically 2 (two) times daily. Use for up to 2 weeks. 03/07/16  Yes Mariel Aloe, MD  feeding supplement, ENSURE ENLIVE, (ENSURE ENLIVE) LIQD Take 237 mLs by mouth 2 (two) times daily between meals. 03/08/16   Mariel Aloe, MD  ondansetron (ZOFRAN-ODT) 4 MG disintegrating tablet Take 1 tablet (4 mg total) by mouth every 8 (eight) hours as needed for nausea. 04/11/16   Mauricio Gerome Apley, MD  pantoprazole (PROTONIX) 40 MG tablet Take 1 tablet (40 mg total) by mouth 2 (two) times daily. 04/11/16   Mauricio Gerome Apley, MD  polyethylene glycol (MIRALAX / Floria Raveling) packet Take 17 g by mouth daily. 04/11/16   Mauricio Gerome Apley, MD  sucralfate (CARAFATE) 1 g tablet Take 1 tablet (1 g total) by mouth 4 (four) times daily -  with meals and at bedtime. 04/11/16 05/11/16  Mauricio Gerome Apley, MD   No Known Allergies Review of Systems + for fatigue, cancer related anorexia.   Physical Exam Asleep, opens eyes, but does not participate in conversations.  S1 S2 Clear Abdomen soft non distended Patient has a large dressing on the back of his head/neck area  Appears chronically ill, appears fatigued.   Vital Signs: BP 110/69 (BP Location: Right Arm)   Pulse (!) 107   Temp 100.2 F (37.9 C) (Oral)   Resp 16   Ht 5\' 10"  (1.778 m)   Wt 64.9 kg (143 lb)   SpO2 97%   BMI 20.52 kg/m  Pain Assessment: 0-10 POSS  *See Group Information*: 1-Acceptable,Awake and alert Pain Score: Asleep   SpO2: SpO2: 97 % O2 Device:SpO2: 97 % O2 Flow Rate: .O2 Flow Rate (L/min): 2 L/min  IO: Intake/output summary:  Intake/Output Summary (Last 24 hours) at 04/14/16 1352 Last data filed at 04/14/16 0600  Gross per 24 hour  Intake             1220 ml  Output              400 ml  Net              820 ml    LBM: Last BM Date: 04/11/16 Baseline Weight: Weight: 64.9 kg (143 lb) Most recent weight: Weight: 64.9 kg (143 lb)     Palliative Assessment/Data:   Flowsheet Rows     Most Recent Value  Intake Tab  Referral Department  Hospitalist  Unit at Time of Referral  Oncology Unit  Palliative Care Primary Diagnosis  Cancer  Palliative Care Type  New Palliative care  Reason for referral  Clarify Goals of Care  Date first seen by Palliative Care  04/14/16  Clinical Assessment  Palliative Performance Scale Score  30%  Pain Max last 24 hours  5  Pain Min Last 24 hours  4  Dyspnea Max Last 24 Hours  5  Dyspnea Min Last 24 hours  4  Psychosocial & Spiritual Assessment  Palliative Care Outcomes  Patient/Family meeting held?  Yes  Who was at the meeting?  mother   Palliative Care Outcomes  Clarified goals of care      Time In:  5009 Time Out:  1345 Time Total:  60 minutes  Greater than 50%  of this time was spent counseling and coordinating care related to the above assessment and plan.  Signed by: Loistine Chance, MD  [phone]  Please contact Palliative Medicine Team phone  at [phone] for questions and concerns.  For individual provider: See Shea Evans

## 2016-04-14 NOTE — Progress Notes (Signed)
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 Patient was admitted for fever of unknown origin, and further workup. Patient was treated with broad-spectrum antibiotic, favored to be associated with cellulitis of neck lesion, but since patient continued to have persistent fever CT of the abdomen was done showing liver lesions concerning for infectious versus malignancy. Patient for biopsy today.   Subjective: Patient seen and examined, continues to have fever. Patient not cooperative with exam  Assessment & Plan: Fever of unknown origin w/ leukocytosis - ? Metastasis to liver rapidly progresing, unknown etiology at this time. No neck stiffness, No ulcer, Cancer lesion does not look infected, CXR negative, No urinary symptoms. Initial blood cultures negative. Will obtain CT of the neck to rule out abscess below the neck tumor. Will continue to hold abx - given there is no source of infection. Check UA as well. Discussed with oncology likely to be related to metastasis   Gastritis  Patient tolerating diet well after the addition of PPI and sucralfate. Continue pain control per oncology.   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 - Continue radiation therapy per oncology team.   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  Monitor CBC  Transfuse if Hgb < 7  DVT prophylaxis: Lovenox  Code Status: FULL  Family Communication: Mother at bedside  Disposition Plan: Pending biopsy results, likely home in 24-48 hrs  Consultants:   Oncology   IR   Procedures:      Antimicrobials:     Objective: Vitals:   04/13/16 2100 04/14/16 0516 04/14/16 0921 04/14/16 1409  BP: (!) 101/56 110/69  93/71  Pulse: (!) 129 (!) 135 (!) 107 (!) 110  Resp: 16   12  Temp: (!) 100.7 F (38.2 C) (!) 103 F (39.4 C) 100.2 F (37.9 C) (!) 102.6 F (39.2 C)  TempSrc: Oral Oral Oral Oral  SpO2: 99% 95% 97% 97%  Weight:      Height:        Intake/Output Summary (Last 24 hours) at 04/14/16 1657 Last data filed at 04/14/16 1409  Gross per 24 hour  Intake             1220 ml  Output              625 ml  Net              595 ml   Filed Weights   04/08/16 1723  Weight: 64.9 kg (143 lb)    Examination:  General exam: Sleepy but responsive HEENT: mucosa moist, neck supple  Respiratory system: CTA no wheezing  Cardiovascular system: S1S2 no murmurs,  Gastrointestinal system: Abdomen soft, NTND  Central nervous system: Sleepy and non cooperative  Extremities: No LE edema Skin: Large neck mass, does not seems to be infected Psychiatry: Mood depressed     Data Reviewed: I have personally reviewed following labs and imaging studies  CBC:  Recent Labs Lab 04/09/16 0130 04/10/16 0840 04/10/16 1140 04/11/16 0536 04/13/16 0537 04/14/16 0523  WBC 21.0* 21.1*  --  22.8* 22.6* 22.0*  NEUTROABS 19.5* 19.0*  --  20.7* 19.7*  18.7*  HGB 10.3* 7.2* 8.7* 7.9* 7.9* 7.9*  HCT 32.2* 21.6* 26.2* 24.9* 24.3* 24.4*  MCV 79.9 80.0  --  79.8 79.9 79.7  PLT 650* 547*  --  568* 651* 725*   Basic Metabolic Panel:  Recent Labs Lab 04/09/16 0130 04/10/16 0840 04/11/16 0536 04/12/16 0455 04/13/16 0537 04/14/16 0523  NA 132* 132* 134*  --  133* 134*  K 4.8 3.8 4.2  --  4.3 4.3  CL 96* 101 101  --  99* 96*  CO2 26 25 24   --  28 29  GLUCOSE 115* 97 94  --  100* 112*  BUN 6 <5* 7  --  <5* 7  CREATININE 0.64 0.44* 0.55* 0.42* 0.42* 0.58*  CALCIUM 9.3 8.5* 9.0  --  9.8 10.3   GFR: Estimated Creatinine Clearance: 109.3 mL/min (A) (by C-G formula based on SCr  of 0.58 mg/dL (L)). Liver Function Tests:  Recent Labs Lab 04/09/16 0130 04/10/16 0840 04/13/16 0537 04/14/16 0523  AST 57* 28 20 26   ALT 33 27 25 24   ALKPHOS 263* 204* 218* 275*  BILITOT 1.3* 0.5 0.3 0.6  PROT 9.0* 6.2* 6.3* 7.3  ALBUMIN 2.3* 1.6* 1.7* 1.8*   No results for input(s): LIPASE, AMYLASE in the last 168 hours. No results for input(s): AMMONIA in the last 168 hours. Coagulation Profile:  Recent Labs Lab 04/13/16 0537  INR 1.30   Cardiac Enzymes:  Recent Labs Lab 04/09/16 0600  TROPONINI 0.04*   BNP (last 3 results) No results for input(s): PROBNP in the last 8760 hours. HbA1C: No results for input(s): HGBA1C in the last 72 hours. CBG: No results for input(s): GLUCAP in the last 168 hours. Lipid Profile: No results for input(s): CHOL, HDL, LDLCALC, TRIG, CHOLHDL, LDLDIRECT in the last 72 hours. Thyroid Function Tests: No results for input(s): TSH, T4TOTAL, FREET4, T3FREE, THYROIDAB in the last 72 hours. Anemia Panel: No results for input(s): VITAMINB12, FOLATE, FERRITIN, TIBC, IRON, RETICCTPCT in the last 72 hours. Sepsis Labs:  Recent Labs Lab 04/08/16 2237  LATICACIDVEN 0.98    Recent Results (from the past 240 hour(s))  Urine culture     Status: None   Collection Time: 04/08/16 10:58 PM  Result Value Ref Range Status   Specimen Description URINE, CLEAN CATCH  Final   Special Requests NONE  Final   Culture   Final    NO GROWTH Performed at [facility], [address], [place], [place] [address]    Report Status 04/10/2016 FINAL  Final  Blood culture (routine x 2)     Status: None   Collection Time: 04/09/16  1:23 AM  Result Value Ref Range Status   Specimen Description BLOOD LEFT FOREARM  Final   Special Requests IN PEDIATRIC BOTTLE 2ML  Final   Culture   Final    NO GROWTH 5 DAYS Performed at [facility], [place] [address] [place], [place] [address]    Report Status 04/14/2016 FINAL  Final  Blood culture (routine x 2)      Status: None   Collection Time: 04/09/16  1:23 AM  Result Value Ref Range Status   Specimen Description BLOOD LEFT HAND  Final   Special Requests IN PEDIATRIC BOTTLE 2ML  Final   Culture   Final    NO GROWTH 5 DAYS Performed at [facility], [place] [address] [place], [place] [address]    Report Status 04/14/2016 FINAL  Final  Radiology Studies: US Biopsy  Result Date: 04/26/16 INDICATION: 46 year old male with multifocal hepatic lesions which are rapidly enlarging. In the setting of a large neck squamous cell carcinoma these are concerning for metastases. Given the relatively rapid growth infection is also possible. EXAM: Ultrasound-guided core biopsy of liver lesion MEDICATIONS: None. ANESTHESIA/SEDATION: Moderate (conscious) sedation was employed during this procedure. A total of Versed 2.5 mg and Fentanyl 75 mcg was administered intravenously. Moderate Sedation Time: 10 minutes. The patient's level of consciousness and vital signs were monitored continuously by radiology nursing throughout the procedure under my direct supervision. FLUOROSCOPY TIME:  Fluoroscopy Time: 0 minutes 0 seconds (0 mGy). COMPLICATIONS: None immediate. PROCEDURE: Informed written consent was obtained from the patient after a thorough discussion of the procedural risks, benefits and alternatives. All questions were addressed. Maximal Sterile Barrier Technique was utilized including caps, mask, sterile gowns, sterile gloves, sterile drape, hand hygiene and skin antiseptic. A timeout was performed prior to the initiation of the procedure. The abdomen was interrogated with ultrasound. There are numerous solid hypoechoic hepatic lesions. A suitable lesion was identified in the skin entry site selected and marked. The skin surface was sterilely prepped and draped in standard fashion using chlorhexidine skin prep. Local anesthesia was attained by infiltration with 1% lidocaine. A small dermatotomy was made. Under  real-time sonographic guidance a 17 gauge introducer needle was advanced through the margin of the liver and positioned at the edge of the lesion. Multiple 18 gauge core biopsies were then obtained coaxially. Biopsy specimens were delivered in both formalin and saline to the lab for further analysis. As the introducer needle was removed, the biopsy tract was embolized with a Gel-Foam slurry. Post biopsy ultrasound imaging demonstrates no active complication. The patient tolerated the procedure well. IMPRESSION: Technically successful ultrasound-guided core biopsy of hepatic lesion. Of note, the lesions appear solid sonographically which favors metastatic disease over infection. Signed, Criselda Peaches, MD [facility] Radiology Electronically Signed   By: Jacqulynn Cadet M.D.   On: 2016/04/26 14:03   Dg Chest Port 1 View  Result Date: 26-Apr-2016 CLINICAL DATA:  46 year old male with fever of unknown origin and cough and congestion. Chest radiograph dated 04/08/2016 EXAM: PORTABLE CHEST 1 VIEW COMPARISON:  None. FINDINGS: The heart size and mediastinal contours are within normal limits. Both lungs are clear. The visualized skeletal structures are unremarkable. IMPRESSION: No active disease. Electronically Signed   By: Anner Crete M.D.   On: 04-26-2016 06:30    Scheduled Meds: . feeding supplement (ENSURE ENLIVE)  237 mL Oral BID BM  . gi cocktail  30 mL Oral Once  .  HYDROmorphone (DILAUDID) injection  1 mg Intravenous Once  . magnesium chloride  2 tablet Oral BID  . pantoprazole  40 mg Oral BID  . polyethylene glycol  17 g Oral Daily  . senna  1 tablet Oral QHS  . sodium chloride flush  3 mL Intravenous Q12H  . sucralfate  1 g Oral TID WC & HS   Continuous Infusions:   LOS: 6 days    Chipper Oman, MD Pager: Text Page via [email or web address]  [phone]  If 7PM-7AM, please contact night-coverage [email or web address] Password [facility] 2016/04/26,  4:57 PM

## 2016-04-15 ENCOUNTER — Ambulatory Visit
Admission: RE | Admit: 2016-04-15 | Discharge: 2016-04-15 | Disposition: A | Discharge status: Home / Self Care | Payer: Medicaid Other | Source: Ambulatory Visit | Attending: Radiation Oncology | Admitting: Radiation Oncology

## 2016-04-15 ENCOUNTER — Encounter: Payer: Self-pay | Admitting: *Deleted

## 2016-04-15 DIAGNOSIS — D649 Anemia, unspecified: Secondary | ICD-10-CM

## 2016-04-15 DIAGNOSIS — E46 Unspecified protein-calorie malnutrition: Secondary | ICD-10-CM

## 2016-04-15 DIAGNOSIS — R509 Fever, unspecified: Secondary | ICD-10-CM

## 2016-04-15 DIAGNOSIS — R000 Tachycardia, unspecified: Secondary | ICD-10-CM

## 2016-04-15 DIAGNOSIS — C760 Malignant neoplasm of head, face and neck: Secondary | ICD-10-CM

## 2016-04-15 DIAGNOSIS — D72829 Elevated white blood cell count, unspecified: Secondary | ICD-10-CM

## 2016-04-15 DIAGNOSIS — B370 Candidal stomatitis: Secondary | ICD-10-CM

## 2016-04-15 LAB — BASIC METABOLIC PANEL
Anion gap: 7 (ref 5–15)
BUN: 6 mg/dL (ref 6–20)
CHLORIDE: 95 mmol/L — AB (ref 101–111)
CO2: 30 mmol/L (ref 22–32)
CREATININE: 0.52 mg/dL — AB (ref 0.61–1.24)
Calcium: 10.3 mg/dL (ref 8.9–10.3)
GFR calc non Af Amer: >60 mL/min (ref >60)
Glucose, Bld: 102 mg/dL — ABNORMAL HIGH (ref 65–99)
Potassium: 4.3 mmol/L (ref 3.5–5.1)
Sodium: 132 mmol/L — ABNORMAL LOW (ref 135–145)

## 2016-04-15 LAB — CBC WITH DIFFERENTIAL/PLATELET
Basophils Absolute: 0 10*3/uL (ref 0.0–0.1)
Basophils Relative: 0 %
EOS ABS: 0.1 10*3/uL (ref 0.0–0.7)
Eosinophils Relative: 0 %
HCT: 22.3 % — ABNORMAL LOW (ref 39.0–52.0)
HEMOGLOBIN: 7.1 g/dL — AB (ref 13.0–17.0)
LYMPHS ABS: 0.6 10*3/uL — AB (ref 0.7–4.0)
Lymphocytes Relative: 3 %
MCH: 25.6 pg — AB (ref 26.0–34.0)
MCHC: 31.8 g/dL (ref 30.0–36.0)
MCV: 80.5 fL (ref 78.0–100.0)
MONOS PCT: 10 %
Monocytes Absolute: 1.9 10*3/uL — ABNORMAL HIGH (ref 0.1–1.0)
NEUTROS ABS: 17.7 10*3/uL — AB (ref 1.7–7.7)
NEUTROS PCT: 87 %
Platelets: 630 10*3/uL — ABNORMAL HIGH (ref 150–400)
RBC: 2.77 MIL/uL — ABNORMAL LOW (ref 4.22–5.81)
RDW: 19.1 % — ABNORMAL HIGH (ref 11.5–15.5)
WBC: 20.3 10*3/uL — ABNORMAL HIGH (ref 4.0–10.5)

## 2016-04-15 LAB — PREPARE RBC (CROSSMATCH)

## 2016-04-15 MED ORDER — CALCITONIN (SALMON) 200 UNIT/ACT NA SOLN
1.0000 | Freq: Every day | NASAL | Status: DC
  Administered 2016-04-15 – 2016-04-16 (×2): 1 via NASAL
  Filled 2016-04-15: qty 3.7

## 2016-04-15 MED ORDER — MIRTAZAPINE 15 MG PO TABS
15.0000 mg | ORAL_TABLET | Freq: Every day | ORAL | Status: DC
  Administered 2016-04-15 – 2016-04-17 (×3): 15 mg via ORAL
  Filled 2016-04-15 (×3): qty 1

## 2016-04-15 MED ORDER — SODIUM CHLORIDE 0.9 % IV SOLN
Freq: Once | INTRAVENOUS | Status: AC
  Administered 2016-04-15: 12:00:00 via INTRAVENOUS

## 2016-04-15 MED ORDER — FLUCONAZOLE 100 MG PO TABS
100.0000 mg | ORAL_TABLET | Freq: Every day | ORAL | Status: DC
  Administered 2016-04-15 – 2016-04-18 (×4): 100 mg via ORAL
  Filled 2016-04-15 (×4): qty 1

## 2016-04-15 MED ORDER — CEFAZOLIN IN D5W 1 GM/50ML IV SOLN
1.0000 g | Freq: Three times a day (TID) | INTRAVENOUS | Status: DC
  Administered 2016-04-15 – 2016-04-18 (×10): 1 g via INTRAVENOUS
  Filled 2016-04-15 (×12): qty 50

## 2016-04-15 NOTE — Progress Notes (Signed)
Addendum:  I received a telephone call from pathologist.  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 The patient and his mother is informed of this test results. The patient is not communicative. His mother thinks that the patient is just sedated from recent pain medicine.  I felt that the patient may be in denial. I discussed with them that based on this findings, the patient have stage IV metastatic cancer, highly aggressive because his prior PET CT scan from January 2018 showed no evidence of liver metastasis. The radiation therapy has not been helpful to hold disease progression. This disease currently is considered noncurable. His only option would be to consider palliative chemotherapy. His mother does not understand the concept of chemotherapy.  I used simple language to make her understand how chemotherapy works Estimated response rate with platinum-containing agent is only in the region of 25%. He cannot start treatment now due to multiple issues including ongoing infection and severe pancytopenia I recommend he continues to receive supportive care therapy over the weekend and I will come back to discuss treatment options further with him next week. I recommend palliative care consult if possible to address goals of care with the patient and family members and to maximize symptom management including pain control and evidence of depression based on my interaction with the patient

## 2016-04-15 NOTE — Progress Notes (Signed)
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 Patient was admitted for fever of unknown origin, and further workup. Patient was treated with broad-spectrum antibiotic, favored to be associated with cellulitis of neck lesion, but since patient continued to have persistent fever CT of the abdomen was done showing liver lesions concerning for infectious versus malignancy. Patient for biopsy today.   Subjective: Patient seen and examined, very depressed. Patient report understanding of his condition and stated "it is what it is". Patient have no concerns   Assessment & Plan: Fever of unknown origin w/ leukocytosis - 2/2 rapidly progressive malignancy. No neck stiffness, No ulcer, d, CXR negative, No urinary symptoms. Initial blood cultures negative. UA negative. Cancer lesion does not look infected although CT scan shows possible infectious process. Patient started on Ancef. Patient also with some oral thrush - placed on diflucan.  Blood cx so far no growth. Continue tylenol PRN fever.   Malignant hypercalcemia - given calcitonin, if thrush improve will give trial of steroids   Gastritis resolved  Patient tolerating diet well after the addition of PPI and sucralfate. Continue pain control per oncology.   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 - Continue radiation therapy per oncology team. Last treatment today   B/l Lung, liver lesions - biopsy confirm metastasis  Oncology discussed case with patient and mother - will offer treatment plan next week when patient is more stable. Very poor prognosis as this is rapidly progressive. 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 Patient seems to be apprehensive and in denial. Support provided. Palliative care team involved, appreciate help.   DVT prophylaxis: Lovenox  Code Status: FULL  Family Communication: Mother at bedside  Disposition Plan: Pending biopsy results, likely home in 24-48 hrs  Consultants:   Oncology   IR   Procedures:     Antimicrobials:     Objective: Vitals:   04/15/16 1000 04/15/16 1211 04/15/16 1226 04/15/16 1408  BP: 112/64 109/62 (!) 103/56 103/61  Pulse: (!) 117 (!) 121 (!) 117 (!) 110  Resp:  18 18 18   Temp:  100.1 F (37.8 C) 100.3 F (37.9 C) 99.9 F (37.7 C)  TempSrc:  Oral Oral Oral  SpO2:  95% 96% 96%  Weight:      Height:        Intake/Output Summary (Last 24 hours) at 04/15/16 1501 Last data filed at 04/15/16 1100  Gross per 24 hour  Intake              358 ml  Output              945 ml  Net             -587 ml   Filed Weights   04/08/16 1723  Weight: 64.9 kg (143 lb)    Examination:  General exam: NAD HEENT: White plaque on tongue and oral mucosa   Respiratory system:  Clear no wheezing  Cardiovascular system: S1S2 tachycardia  Gastrointestinal system: Soft non distended  Central nervous system: AAOx3 more cooperative today   Extremities: No edema  Skin: Large dressing on posterior neck Psychiatry: Mood depressed     Data Reviewed: I have personally reviewed following labs and imaging studies  CBC:  Recent Labs Lab 04/10/16 0840 04/10/16 1140 04/11/16 0536 04/13/16 0537 04/14/16 0523 04/15/16 0510  WBC 21.1*  --  22.8* 22.6* 22.0* 20.3*  NEUTROABS 19.0*  --  20.7* 19.7* 18.7* 17.7*  HGB 7.2* 8.7* 7.9* 7.9* 7.9* 7.1*  HCT 21.6* 26.2* 24.9* 24.3* 24.4* 22.3*  MCV 80.0  --  79.8 79.9 79.7  80.5  PLT 547*  --  568* 651* 674* 357*   Basic Metabolic Panel:  Recent Labs Lab 04/10/16 0840 04/11/16 0536 04/12/16 0455 04/13/16 0537 04/14/16 0523 04/15/16 0510  NA 132* 134*  --  133* 134* 132*  K 3.8 4.2  --  4.3 4.3 4.3  CL 101 101  --  99* 96* 95*  CO2 25 24  --  28 29 30   GLUCOSE 97 94  --  100* 112* 102*  BUN <5* 7  --  <5* 7 6  CREATININE 0.44* 0.55* 0.42* 0.42* 0.58* 0.52*  CALCIUM 8.5* 9.0  --  9.8 10.3 10.3   GFR: Estimated Creatinine Clearance: 109.3 mL/min (A) (by C-G formula based on SCr of 0.52 mg/dL (L)). Liver Function Tests:  Recent Labs Lab 04/09/16 0130 04/10/16 0840 04/13/16 0537 04/14/16 0523  AST 57* 28 20 26   ALT 33 27 25 24   ALKPHOS 263* 204* 218* 275*  BILITOT 1.3* 0.5 0.3 0.6  PROT 9.0* 6.2* 6.3* 7.3  ALBUMIN 2.3* 1.6* 1.7* 1.8*   No results for input(s): LIPASE, AMYLASE in the last 168 hours. No results for input(s): AMMONIA in the last 168 hours. Coagulation Profile:  Recent Labs Lab 04/13/16 0537  INR 1.30   Cardiac Enzymes:  Recent Labs Lab 04/09/16 0600  TROPONINI 0.04*   BNP (last 3 results) No results for input(s): PROBNP in the last 8760 hours. HbA1C: No results for input(s): HGBA1C in the last 72 hours. CBG: No results for input(s): GLUCAP in the last 168 hours. Lipid Profile: No results for input(s): CHOL, HDL, LDLCALC, TRIG, CHOLHDL, LDLDIRECT in the last 72 hours. Thyroid Function Tests: No results for input(s): TSH, T4TOTAL, FREET4, T3FREE, THYROIDAB in the last 72 hours. Anemia Panel: No results for input(s): VITAMINB12, FOLATE, FERRITIN, TIBC, IRON, RETICCTPCT in the last 72 hours. Sepsis Labs:  Recent Labs Lab 04/08/16 2237  LATICACIDVEN 0.98    Recent Results (from the past 240 hour(s))  Urine culture     Status: None   Collection Time: 04/08/16 10:58 PM  Result Value Ref Range Status   Specimen Description URINE, CLEAN CATCH  Final   Special Requests NONE  Final   Culture   Final    NO  GROWTH Performed at [facility], [address] [place], [place] [address]    Report Status 04/10/2016 FINAL  Final  Blood culture (routine x 2)     Status: None   Collection Time: 04/09/16  1:23 AM  Result Value Ref Range Status   Specimen Description BLOOD LEFT FOREARM  Final   Special Requests IN PEDIATRIC BOTTLE 2ML  Final   Culture   Final    NO GROWTH 5 DAYS Performed at [facility], [place] [address] [place], [place] [address]    Report  Status 04/14/2016 FINAL  Final  Blood culture (routine x 2)     Status: None   Collection Time: 04/09/16  1:23 AM  Result Value Ref Range Status   Specimen Description BLOOD LEFT HAND  Final   Special Requests IN PEDIATRIC BOTTLE 2ML  Final   Culture   Final    NO GROWTH 5 DAYS Performed at [facility], [place] [address], [place], [place] [address]    Report Status 04/14/2016 FINAL  Final  Culture, blood (Routine X 2) w Reflex to ID Panel     Status: None (Preliminary result)   Collection Time: 04/14/16  6:56 AM  Result Value Ref Range Status   Specimen Description BLOOD LEFT HAND  Final   Special Requests IN PEDIATRIC BOTTLE 3CC  Final   Culture   Final    NO GROWTH 1 DAY Performed at [facility], [place] [address] [place], [place] [address]    Report Status PENDING  Incomplete  Culture, blood (Routine X 2) w Reflex to ID Panel     Status: None (Preliminary result)   Collection Time: 04/14/16  8:04 AM  Result Value Ref Range Status   Specimen Description BLOOD RIGHT ARM  Final   Special Requests IN PEDIATRIC BOTTLE 4CC  Final   Culture   Final    NO GROWTH 1 DAY Performed at [facility], [place] [address] [place], [place] [address]    Report Status PENDING  Incomplete     Radiology Studies: Ct Soft Tissue Neck W Contrast  Result Date: 04/14/2016 CLINICAL DATA:  Squamous cell carcinoma of the neck.  Fever. EXAM: CT NECK WITH CONTRAST TECHNIQUE: Multidetector CT imaging of the neck was performed using  the standard protocol following the bolus administration of intravenous contrast. CONTRAST:  74mL ISOVUE-300 IOPAMIDOL (ISOVUE-300) INJECTION 61% COMPARISON:  Head CT 03/10/2016 FINDINGS: Pharynx and larynx: The nasopharynx is clear. The oropharynx and hypopharynx are normal. The epiglottis, supraglottic larynx, glottis and subglottic larynx are normal. No retropharyngeal collection. The parapharyngeal spaces are preserved. The visible oral cavity and base of tongue are normal. Salivary glands: The posterior aspect of the right parotid gland is involved by the below described posterior right neck process. The left parotid gland and both submandibular glands are normal. Thyroid: Normal. Lymph nodes/superficial soft tissues: There is a large, predominantly necrotic mass in the posterior right neck that measures 6.2 x 9.7 x 9.6 cm. There are multiple nearby centrally necrotic lesions, the largest of which is at the posterior aspect of the right parotid gland that measures 2.9 x 1.8 cm. Abnormal soft tissue extends anteriorly and medially into the right carotid space. Additional satellite lesions are at the right posterior midline of the neck measuring 8 x 5 mm each. There is a 1 cm necrotic lymph node at right level 5A. Vascular: The right internal carotid artery is in close proximity to the above-described mass, which likely at least abuts the artery. It is difficult to discern whether there is encasement. Limited intracranial: Normal. Visualized orbits: Normal. Mastoids and visualized paranasal sinuses: Clear. Skeleton: There is no bony spinal canal stenosis. No lytic or blastic lesions. Upper chest: Normal. Other: None. IMPRESSION: 1. Massive right posterior neck mass that is almost completely necrotic and measures up to 9.6 cm, consistent with known squamous cell carcinoma. Multiple satellite lesions in the right parotid space and posterior neck are likely necrotic metastatic lymph nodes. 2. Soft tissue edema and  stranding surrounding the  above-described lesion. Infection superimposed on necrotic malignancy is a possibility. 3. Close proximity of the right neck mass to the right internal carotid artery, though it is difficult to tell whether the artery is encased. Electronically Signed   By: Ulyses Jarred M.D.   On: 04/14/2016 23:51   US Biopsy  Result Date: 04/14/2016 INDICATION: 46 year old male with multifocal hepatic lesions which are rapidly enlarging. In the setting of a large neck squamous cell carcinoma these are concerning for metastases. Given the relatively rapid growth infection is also possible. EXAM: Ultrasound-guided core biopsy of liver lesion MEDICATIONS: None. ANESTHESIA/SEDATION: Moderate (conscious) sedation was employed during this procedure. A total of Versed 2.5 mg and Fentanyl 75 mcg was administered intravenously. Moderate Sedation Time: 10 minutes. The patient's level of consciousness and vital signs were monitored continuously by radiology nursing throughout the procedure under my direct supervision. FLUOROSCOPY TIME:  Fluoroscopy Time: 0 minutes 0 seconds (0 mGy). COMPLICATIONS: None immediate. PROCEDURE: Informed written consent was obtained from the patient after a thorough discussion of the procedural risks, benefits and alternatives. All questions were addressed. Maximal Sterile Barrier Technique was utilized including caps, mask, sterile gowns, sterile gloves, sterile drape, hand hygiene and skin antiseptic. A timeout was performed prior to the initiation of the procedure. The abdomen was interrogated with ultrasound. There are numerous solid hypoechoic hepatic lesions. A suitable lesion was identified in the skin entry site selected and marked. The skin surface was sterilely prepped and draped in standard fashion using chlorhexidine skin prep. Local anesthesia was attained by infiltration with 1% lidocaine. A small dermatotomy was made. Under real-time sonographic guidance a 17 gauge  introducer needle was advanced through the margin of the liver and positioned at the edge of the lesion. Multiple 18 gauge core biopsies were then obtained coaxially. Biopsy specimens were delivered in both formalin and saline to the lab for further analysis. As the introducer needle was removed, the biopsy tract was embolized with a Gel-Foam slurry. Post biopsy ultrasound imaging demonstrates no active complication. The patient tolerated the procedure well. IMPRESSION: Technically successful ultrasound-guided core biopsy of hepatic lesion. Of note, the lesions appear solid sonographically which favors metastatic disease over infection. Signed, Criselda Peaches, MD [facility] Radiology Electronically Signed   By: Jacqulynn Cadet M.D.   On: 04/14/2016 14:03   Dg Chest Port 1 View  Result Date: 04/14/2016 CLINICAL DATA:  46 year old male with fever of unknown origin and cough and congestion. Chest radiograph dated 04/08/2016 EXAM: PORTABLE CHEST 1 VIEW COMPARISON:  None. FINDINGS: The heart size and mediastinal contours are within normal limits. Both lungs are clear. The visualized skeletal structures are unremarkable. IMPRESSION: No active disease. Electronically Signed   By: Anner Crete M.D.   On: 04/14/2016 06:30    Scheduled Meds: . calcitonin (salmon)  1 spray Alternating Nares Daily  .  ceFAZolin (ANCEF) IV  1 g Intravenous Q8H  . feeding supplement (ENSURE ENLIVE)  237 mL Oral BID BM  . fluconazole  100 mg Oral Daily  . gi cocktail  30 mL Oral Once  .  HYDROmorphone (DILAUDID) injection  1 mg Intravenous Once  . magnesium chloride  2 tablet Oral BID  . mirtazapine  15 mg Oral QHS  . pantoprazole  40 mg Oral BID  . polyethylene glycol  17 g Oral Daily  . senna  1 tablet Oral QHS  . sodium chloride flush  3 mL Intravenous Q12H  . sucralfate  1 g Oral  TID WC & HS   Continuous Infusions:   LOS: 7 days    Chipper Oman, MD Pager:  Text Page via [email or web address]  [phone]  If 7PM-7AM, please contact night-coverage [email or web address] Password [facility] 04/15/2016, 3:01 PM

## 2016-04-15 NOTE — Progress Notes (Signed)
Johnny Mooney   DOB:1970/05/07   PJ#:[id number]    Subjective: The patient continues to have high-grade fever and tachycardia.  This morning, he denies pain.  He had continuous suction going on in his mouth.  He has some mild cough.  Cultures are pending.  He was started on Ancef overnight. The patient denies any recent signs or symptoms of bleeding such as spontaneous epistaxis, hematuria or hematochezia.   Objective:  Vitals:   04/15/16 0552 04/15/16 0800  BP: 102/62   Pulse: (!) 119   Resp: 16   Temp: (!) 102.3 F (39.1 C) (!) 100.5 F (38.1 C)     Intake/Output Summary (Last 24 hours) at 04/15/16 0818 Last data filed at 04/15/16 0410  Gross per 24 hour  Intake                0 ml  Output              950 ml  Net             -950 ml    GENERAL:alert, no distress and comfortable SKIN: He had large metastatic cancer at the back of his neck EYES: normal, Conjunctiva are pink and non-injected, sclera clear OROPHARYNX:n noted evidence of thrush in his mouth l  LUNGS: clear to auscultation and percussion with normal breathing effort HEART: Tachycardia, no murmurs and no lower extremity edema ABDOMEN:abdomen soft, non-tender and normal bowel sounds Musculoskeletal:no cyanosis of digits and no clubbing  NEURO: alert & oriented x 3 with fluent speech, no focal motor/sensory deficits   Labs:  Lab Results  Component Value Date   WBC 20.3 (H) 04/15/2016   HGB 7.1 (L) 04/15/2016   HCT 22.3 (L) 04/15/2016   MCV 80.5 04/15/2016   PLT 630 (H) 04/15/2016   NEUTROABS 17.7 (H) 04/15/2016    Lab Results  Component Value Date   NA 132 (L) 04/15/2016   K 4.3 04/15/2016   CL 95 (L) 04/15/2016   CO2 30 04/15/2016    Studies: I have reviewed the imaging studies Ct Soft Tissue Neck W Contrast  Result Date: 04/14/2016 CLINICAL DATA:  Squamous cell carcinoma of the neck.  Fever. EXAM: CT NECK WITH CONTRAST TECHNIQUE: Multidetector CT imaging of the neck was performed using the  standard protocol following the bolus administration of intravenous contrast. CONTRAST:  4mL ISOVUE-300 IOPAMIDOL (ISOVUE-300) INJECTION 61% COMPARISON:  Head CT 03/10/2016 FINDINGS: Pharynx and larynx: The nasopharynx is clear. The oropharynx and hypopharynx are normal. The epiglottis, supraglottic larynx, glottis and subglottic larynx are normal. No retropharyngeal collection. The parapharyngeal spaces are preserved. The visible oral cavity and base of tongue are normal. Salivary glands: The posterior aspect of the right parotid gland is involved by the below described posterior right neck process. The left parotid gland and both submandibular glands are normal. Thyroid: Normal. Lymph nodes/superficial soft tissues: There is a large, predominantly necrotic mass in the posterior right neck that measures 6.2 x 9.7 x 9.6 cm. There are multiple nearby centrally necrotic lesions, the largest of which is at the posterior aspect of the right parotid gland that measures 2.9 x 1.8 cm. Abnormal soft tissue extends anteriorly and medially into the right carotid space. Additional satellite lesions are at the right posterior midline of the neck measuring 8 x 5 mm each. There is a 1 cm necrotic lymph node at right level 5A. Vascular: The right internal carotid artery is in close proximity to the above-described mass, which likely at least  abuts the artery. It is difficult to discern whether there is encasement. Limited intracranial: Normal. Visualized orbits: Normal. Mastoids and visualized paranasal sinuses: Clear. Skeleton: There is no bony spinal canal stenosis. No lytic or blastic lesions. Upper chest: Normal. Other: None. IMPRESSION: 1. Massive right posterior neck mass that is almost completely necrotic and measures up to 9.6 cm, consistent with known squamous cell carcinoma. Multiple satellite lesions in the right parotid space and posterior neck are likely necrotic metastatic lymph nodes. 2. Soft tissue edema and  stranding surrounding the above-described lesion. Infection superimposed on necrotic malignancy is a possibility. 3. Close proximity of the right neck mass to the right internal carotid artery, though it is difficult to tell whether the artery is encased. Electronically Signed   By: Ulyses Jarred M.D.   On: 04/14/2016 23:51   US Biopsy  Result Date: 04/14/2016 INDICATION: 46 year old male with multifocal hepatic lesions which are rapidly enlarging. In the setting of a large neck squamous cell carcinoma these are concerning for metastases. Given the relatively rapid growth infection is also possible. EXAM: Ultrasound-guided core biopsy of liver lesion MEDICATIONS: None. ANESTHESIA/SEDATION: Moderate (conscious) sedation was employed during this procedure. A total of Versed 2.5 mg and Fentanyl 75 mcg was administered intravenously. Moderate Sedation Time: 10 minutes. The patient's level of consciousness and vital signs were monitored continuously by radiology nursing throughout the procedure under my direct supervision. FLUOROSCOPY TIME:  Fluoroscopy Time: 0 minutes 0 seconds (0 mGy). COMPLICATIONS: None immediate. PROCEDURE: Informed written consent was obtained from the patient after a thorough discussion of the procedural risks, benefits and alternatives. All questions were addressed. Maximal Sterile Barrier Technique was utilized including caps, mask, sterile gowns, sterile gloves, sterile drape, hand hygiene and skin antiseptic. A timeout was performed prior to the initiation of the procedure. The abdomen was interrogated with ultrasound. There are numerous solid hypoechoic hepatic lesions. A suitable lesion was identified in the skin entry site selected and marked. The skin surface was sterilely prepped and draped in standard fashion using chlorhexidine skin prep. Local anesthesia was attained by infiltration with 1% lidocaine. A small dermatotomy was made. Under real-time sonographic guidance a 17 gauge  introducer needle was advanced through the margin of the liver and positioned at the edge of the lesion. Multiple 18 gauge core biopsies were then obtained coaxially. Biopsy specimens were delivered in both formalin and saline to the lab for further analysis. As the introducer needle was removed, the biopsy tract was embolized with a Gel-Foam slurry. Post biopsy ultrasound imaging demonstrates no active complication. The patient tolerated the procedure well. IMPRESSION: Technically successful ultrasound-guided core biopsy of hepatic lesion. Of note, the lesions appear solid sonographically which favors metastatic disease over infection. Signed, Criselda Peaches, MD [facility] Radiology Electronically Signed   By: Jacqulynn Cadet M.D.   On: 04/14/2016 14:03   Dg Chest Port 1 View  Result Date: 04/14/2016 CLINICAL DATA:  46 year old male with fever of unknown origin and cough and congestion. Chest radiograph dated 04/08/2016 EXAM: PORTABLE CHEST 1 VIEW COMPARISON:  None. FINDINGS: The heart size and mediastinal contours are within normal limits. Both lungs are clear. The visualized skeletal structures are unremarkable. IMPRESSION: No active disease. Electronically Signed   By: Anner Crete M.D.   On: 04/14/2016 06:30    Assessment & Plan:   Metastatic squamous cell carcinoma of the head and neck with regional lymphadenopathy and now evidence of liver metastasis Bilateral lung nodules, likely metastatic cancer  Rapidly enlarging liver metastasis Biopsies are pending His CT imaging study is compatible with rapidly progressive disease I will discuss with the patient related to treatment options once biopsy is available  Severe anemia Could be related to bone marrow disease We discussed some of the risks, benefits, and alternatives of blood transfusions. The patient is symptomatic from anemia and the hemoglobin level is critically low.  Some of  the side-effects to be expected including risks of transfusion reactions, chills, infection, syndrome of volume overload and risk of hospitalization from various reasons and the patient is willing to proceed and went ahead to sign consent today. I will transfuse 2 units of blood  Leukocytosis with fever of unknown origin  Oral thrush Cultures are pending He is started on IV antibiotics I will add fluconazole  Severe protein calorie malnutrition Malignant cachexia Continue nutrition support I am concerned about starting him on Megace due to risk of thrombosis I will start him on mirtazapine for now He may benefit from low-dose steroid therapy but I would like to wait to the thrush cleared first  Malignant hypercalcemia His corrected calcium is very high I will start him on some calcitonin If thrush improve over the weekend on next week, he may benefit from low-dose steroid therapy  Discharge planning His prognosis is very poor Even though he is young without major comorbidities, his cancer is highly aggressive He will be completing radiation treatment today He will likely be staying over the weekend due to unresolved fever and malignant hypercalcemia If he gets discharged over the weekend, he has appointment to see me next week in the outpatient clinic  Please call if questions arise Plans were discussed with primary service and the patient  Heath Lark, MD 04/15/2016  8:18 AM

## 2016-04-15 NOTE — Progress Notes (Signed)
Oncology Nurse Navigator Documentation  Met with Johnny Mooney during final RT to offer support and to celebrate end of radiation treatment.  He was accompanied by his mother. I provided mother with a Certificate of Recognition for her supportive care. He understands I will continue as his navigator for the upcoming months.  Gayleen Orem, RN, BSN, [place] [facility] [place] [phone]

## 2016-04-15 NOTE — Progress Notes (Signed)
Oncology Nurse Navigator Documentation  Changed dressing over R neck tumor, wet-to-dry, secured with paper tape.  Gayleen Orem, RN, BSN, [facility] Oncology Nurse [place] [facility] [place] [phone]

## 2016-04-15 NOTE — Care Management Note (Signed)
Case Management Note  Patient Details  Name: Johnny Mooney MRN: [id number] Date of Birth: 15-Aug-1970  Subjective/Objective:45 y/o m admitted w/FUO. Hx: stage 4 Metastatic Ca. From home.fevers, leukocytosis,tachycardia.Noted aggressive cancer, awaiting liver bx results. Palliative following.                  Action/Plan:d/c plan home.   Expected Discharge Date:  04/12/16               Expected Discharge Plan:  Home/Self Care  In-House Referral:     Discharge planning Services  CM Consult  Post Acute Care Choice:    Choice offered to:     DME Arranged:    DME Agency:     HH Arranged:    HH Agency:     Status of Service:  Completed, signed off  If discussed at [employer] of Stay Meetings, dates discussed:    Additional Comments:  Dessa Phi, RN 04/15/2016, 10:58 AM

## 2016-04-16 LAB — COMPREHENSIVE METABOLIC PANEL
ALT: 28 U/L (ref 17–63)
AST: 35 U/L (ref 15–41)
Albumin: 1.7 g/dL — ABNORMAL LOW (ref 3.5–5.0)
Alkaline Phosphatase: 351 U/L — ABNORMAL HIGH (ref 38–126)
Anion gap: 7 (ref 5–15)
BILIRUBIN TOTAL: 0.7 mg/dL (ref 0.3–1.2)
BUN: 6 mg/dL (ref 6–20)
CO2: 28 mmol/L (ref 22–32)
Calcium: 10.8 mg/dL — ABNORMAL HIGH (ref 8.9–10.3)
Chloride: 97 mmol/L — ABNORMAL LOW (ref 101–111)
Creatinine, Ser: 0.51 mg/dL — ABNORMAL LOW (ref 0.61–1.24)
GFR calc Af Amer: >60 mL/min (ref >60)
Glucose, Bld: 105 mg/dL — ABNORMAL HIGH (ref 65–99)
Potassium: 4.2 mmol/L (ref 3.5–5.1)
Sodium: 132 mmol/L — ABNORMAL LOW (ref 135–145)
TOTAL PROTEIN: 7.1 g/dL (ref 6.5–8.1)

## 2016-04-16 LAB — CBC WITH DIFFERENTIAL/PLATELET
Basophils Absolute: 0 10*3/uL (ref 0.0–0.1)
Basophils Relative: 0 %
Eosinophils Absolute: 0 10*3/uL (ref 0.0–0.7)
Eosinophils Relative: 0 %
HEMATOCRIT: 29.5 % — AB (ref 39.0–52.0)
HEMOGLOBIN: 9.7 g/dL — AB (ref 13.0–17.0)
LYMPHS PCT: 5 %
Lymphs Abs: 1.1 10*3/uL (ref 0.7–4.0)
MCH: 26.9 pg (ref 26.0–34.0)
MCHC: 32.9 g/dL (ref 30.0–36.0)
MCV: 81.7 fL (ref 78.0–100.0)
MONOS PCT: 6 %
Monocytes Absolute: 1.3 10*3/uL — ABNORMAL HIGH (ref 0.1–1.0)
Neutro Abs: 19.7 10*3/uL — ABNORMAL HIGH (ref 1.7–7.7)
Neutrophils Relative %: 89 %
Platelets: 620 10*3/uL — ABNORMAL HIGH (ref 150–400)
RBC: 3.61 MIL/uL — AB (ref 4.22–5.81)
RDW: 18.2 % — AB (ref 11.5–15.5)
WBC: 22.1 10*3/uL — AB (ref 4.0–10.5)

## 2016-04-16 LAB — PHOSPHORUS: PHOSPHORUS: 2.3 mg/dL — AB (ref 2.5–4.6)

## 2016-04-16 LAB — MAGNESIUM: Magnesium: 1.8 mg/dL (ref 1.7–2.4)

## 2016-04-16 MED ORDER — CALCITONIN (SALMON) 200 UNIT/ML IJ SOLN
4.0000 [IU]/kg | Freq: Every day | INTRAMUSCULAR | Status: DC
  Administered 2016-04-16 – 2016-04-18 (×2): 260 [IU] via SUBCUTANEOUS
  Filled 2016-04-16 (×3): qty 1.3

## 2016-04-16 MED ORDER — CALCITONIN (SALMON) 200 UNIT/ML IJ SOLN: 50.0000 [IU] | INTRAMUSCULAR | Status: DC

## 2016-04-16 MED ORDER — IBUPROFEN 200 MG PO TABS
200.0000 mg | ORAL_TABLET | ORAL | Status: DC | PRN
  Administered 2016-04-16 – 2016-04-17 (×2): 200 mg via ORAL
  Filled 2016-04-16 (×2): qty 1

## 2016-04-16 MED ORDER — SODIUM CHLORIDE 0.9 % IV SOLN
INTRAVENOUS | Status: DC
  Administered 2016-04-16 – 2016-04-18 (×4): via INTRAVENOUS

## 2016-04-16 MED ORDER — FENTANYL CITRATE (PF) 100 MCG/2ML IJ SOLN
12.5000 ug | INTRAMUSCULAR | Status: DC | PRN
  Administered 2016-04-16 – 2016-04-17 (×4): 12.5 ug via INTRAVENOUS
  Filled 2016-04-16 (×4): qty 2

## 2016-04-16 NOTE — Progress Notes (Signed)
Daily Progress Note   Patient Name: Johnny Mooney       Date: 04/16/2016 DOB: [birth year]  Age: 46 y.o. 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  Subjective: Awake, lying on one side. Asking for pain medicine. Does not want to discuss much. Mother with flat affect. See below.  Length of Stay: 8  Current Medications: Scheduled Meds:  . calcitonin (salmon)  1 spray Alternating Nares Daily  .  ceFAZolin (ANCEF) IV  1 g Intravenous Q8H  . feeding supplement (ENSURE ENLIVE)  237 mL Oral BID BM  . fluconazole  100 mg Oral Daily  . gi cocktail  30 mL Oral Once  .  HYDROmorphone (DILAUDID) injection  1 mg Intravenous Once  . magnesium chloride  2 tablet Oral BID  . mirtazapine  15 mg Oral QHS  . pantoprazole  40 mg Oral BID  . polyethylene glycol  17 g Oral Daily  . senna  1 tablet Oral QHS  . sodium chloride flush  3 mL Intravenous Q12H  . sucralfate  1 g Oral TID WC & HS    Continuous Infusions:   PRN Meds: acetaminophen, bisacodyl, diphenhydrAMINE, HYDROmorphone (DILAUDID) injection, lidocaine, magic mouthwash, ondansetron **OR** ondansetron (ZOFRAN) IV, oxyCODONE-acetaminophen  Physical Exam         Awake alert thin young gentleman Large dressing on the back of his head S1-S2 Clear No edema Thin muscle wasting evident Awake alert  Vital Signs: BP 112/63 (BP Location: Left Arm)   Pulse (!) 115   Temp (!) 101.5 F (38.6 C) (Oral)   Resp 20   Ht 5\' 10"  (1.778 m)    Wt 64.9 kg (143 lb)   SpO2 97%   BMI 20.52 kg/m  SpO2: SpO2: 97 % O2 Device: O2 Device: Not Delivered O2 Flow Rate: O2 Flow Rate (L/min): 2 L/min  Intake/output summary:  Intake/Output Summary (Last 24 hours) at 04/16/16 1050 Last data filed at 04/16/16 0600  Gross per 24 hour  Intake              905 ml  Output  370 ml  Net              535 ml   LBM: Last BM Date: 04/15/16 Baseline Weight: Weight: 64.9 kg (143 lb) Most recent weight: Weight: 64.9 kg (143 lb)       Palliative Assessment/Data:    Flowsheet Rows     Most Recent Value  Intake Tab  Referral Department  Hospitalist  Unit at Time of Referral  Oncology Unit  Palliative Care Primary Diagnosis  Cancer  Date Notified  04/14/16  Palliative Care Type  Return patient Palliative Care  Reason for referral  Clarify Goals of Care  Date of Admission  04/08/16  Date first seen by Palliative Care  04/14/16  # of days Palliative referral response time  0 Day(s)  # of days IP prior to Palliative referral  6  Clinical Assessment  Palliative Performance Scale Score  30%  Pain Max last 24 hours  4  Pain Min Last 24 hours  3  Dyspnea Max Last 24 Hours  4  Dyspnea Min Last 24 hours  3  Psychosocial & Spiritual Assessment  Palliative Care Outcomes  Patient/Family meeting held?  Yes  Who was at the meeting?  patient, mother   Palliative Care Outcomes  Clarified goals of care      Patient Active Problem List   Diagnosis Date Noted  . Fever of undetermined origin 04/08/2016  . Hyponatremia 04/08/2016  . Leukocytosis 04/08/2016  . Protein-calorie malnutrition, severe 03/11/2016  . Chronic anemia 03/10/2016  . Headache 03/10/2016  . Tinea pedis 03/03/2016  . Malnutrition of moderate degree 02/27/2016  . Sepsis ([place]) 02/27/2016  . Fever 02/27/2016  . Hypercalcemia of malignancy 02/25/2016  . Hypercalcemia 02/25/2016  . AKI (acute kidney injury) ([place]) 02/25/2016  . Squamous cell carcinoma of neck  01/27/2016  . 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 He wishes for full resuscitative attempt at end-of-life. He becomes uncomfortable/angry and does not wish to discuss further. His mother has flat affect and wishes to continue with patient's wishes.  I agree with the Remeron I agree with Dilaudid. Continue supportive care and active symptom management. Continue gentle goals of care discussions as patient and mother are able. Provided active listening and supportive care. Endorsed that the patient has a serious illness.  Code Status:    Code Status Orders        Start     Ordered   04/09/16 0202  Full code  Continuous     04/09/16 0203    Code Status History    Date Active Date Inactive Code Status Order ID Comments User Context   04/08/2016 11:47 PM 04/09/2016  2:03 AM Full Code [id number]  Reubin Milan, MD Inpatient   03/10/2016  5:27 PM 03/11/2016  8:40 PM Full Code [id number]  Donne Hazel, MD ED   02/25/2016 10:23 PM 03/07/2016 11:09 PM Full Code [id number]  Shon Millet, DO Inpatient   01/28/2016 10:31 PM 02/02/2016  9:29 PM Full Code [id number]  Alphonsa Overall, MD Inpatient  02/20/2012 12:59 AM 02/22/2012  3:31 PM Full Code [id number]  Rise Patience, MD Inpatient       Prognosis:   < 3 months?  Discharge Planning:  To Be Determined  Care plan was discussed with patient, mother   Thank you for allowing  the Palliative Medicine Team to assist in the care of this patient.   Time In: 10 Time Out: 10.25 Total Time 25 Prolonged Time Billed  no       Greater than 50%  of this time was spent counseling and coordinating care related to the above assessment and plan.  Loistine Chance, MD [phone]  Please contact Palliative Medicine Team phone at [phone] for questions and concerns.

## 2016-04-16 NOTE — Progress Notes (Signed)
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 Patient was admitted for fever of unknown origin, and further workup. Patient was treated with broad-spectrum antibiotic, favored to be associated with cellulitis of neck lesion, but since patient continued to have persistent fever CT of the abdomen was done showing liver lesions concerning for infectious versus malignancy. Patient for biopsy today.   Subjective: Patient seen and examined with family at bedside. Patient non cooperative today, stated that he is ok and go to sleep.   Assessment & Plan: Fever of unknown origin w/ leukocytosis - likely due to rapidly progressive active metastais . No findings of infection other than ? possible deep infected tissue at the base of the tumor. Seen on CT, although lesion it self does not look infected. Patient was started in Ancef, but no improvement of fever or WBC. Patient also with some oral thrush - on diflucan thrush improving.  Blood cx so far NGTD. Continue tylenol PRN fever.   Malignant hypercalcemia - Corrected Ca+ 12.6, given calcitonin, Will add IVF, switch calcitoning to SQ. Ritta Slot seems to be improving if this trend continue will start steroid on "Sunday or Monday   Gastritis - resolved  Patient tolerating diet well after the addition of PPI and sucralfate. Continue pain control per oncology.   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 - Per oncology team. Last radiation therapy 04/15/16.    Liver metastasis - biopsy confirm metastasis - poorly differentiated squamous cell Ca  Oncology discussed case with patient and mother - will offer treatment plan next week when patient  is more stable. Very poor prognosis as this is rapidly progressive. 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 Family questions answered. Appreciated palliative care help.   DVT prophylaxis: Lovenox  Code Status: FULL  Family Communication: Mother at bedside  Disposition Plan: Monitoring Ca+ if improvement will d/c home in the next 48 hrs with follow up w Dr Gorsuch.  Consultants:   Oncology   IR   Procedures:     Antimicrobials:     Objective: Vitals:   04/16/16 0651 04/16/16 0929 04/16/16 1058 04/16/16 1358  BP:  112/63  111/65  Pulse:  (!) 115  (!) 120  Resp:  20  (!) 21  Temp: (!) 101.3 F (38.5 C) (!) 101.5 F (38.6 C) 100 F (37.8 C) (!) 102.5 F (39.2 C)  TempSrc: Oral Oral Oral Oral  SpO2:  97%  100%  Weight:      Height:        Intake/Output Summary (Last 24 hours) at 04/16/16 1442 Last data filed at 04/16/16 1300  Gross per 24 hour  Intake              787 ml  Output              550 ml  Net              23" 7 ml   [employer]  04/08/16 1723  Weight: 64.9 kg (143 lb)    Examination:  General exam: NAD HEENT: White plaque improving  Respiratory system: CTA  Cardiovascular system: S1S2 RRR Gastrointestinal system: Soft, mild epigastric tenderness Central nervous system: Sleepy, non cooperative and not interactive  Extremities: No edema  Skin: Large dressing on posterior neck Psychiatry: Mood depressed and withdrawn    Data Reviewed: I have personally reviewed following labs and imaging studies  CBC:  Recent Labs Lab 04/11/16 0536 04/13/16 0537 04/14/16 0523 04/15/16 0510 04/16/16 0810  WBC 22.8* 22.6* 22.0* 20.3* 22.1*  NEUTROABS 20.7* 19.7* 18.7* 17.7* 19.7*  HGB 7.9* 7.9* 7.9* 7.1* 9.7*  HCT 24.9*  24.3* 24.4* 22.3* 29.5*  MCV 79.8 79.9 79.7 80.5 81.7  PLT 568* 651* 674* 630* 935*   Basic Metabolic Panel:  Recent Labs Lab 04/11/16 0536 04/12/16 0455 04/13/16 0537 04/14/16 0523 04/15/16 0510 04/16/16 0810  NA 134*  --  133* 134* 132* 132*  K 4.2  --  4.3 4.3 4.3 4.2  CL 101  --  99* 96* 95* 97*  CO2 24  --  28 29 30 28   GLUCOSE 94  --  100* 112* 102* 105*  BUN 7  --  <5* 7 6 6   CREATININE 0.55* 0.42* 0.42* 0.58* 0.52* 0.51*  CALCIUM 9.0  --  9.8 10.3 10.3 10.8*  MG  --   --   --   --   --  1.8  PHOS  --   --   --   --   --  2.3*   GFR: Estimated Creatinine Clearance: 109.3 mL/min (A) (by C-G formula based on SCr of 0.51 mg/dL (L)). Liver Function Tests:  Recent Labs Lab 04/10/16 0840 04/13/16 0537 04/14/16 0523 04/16/16 0810  AST 28 20 26  35  ALT 27 25 24 28   ALKPHOS 204* 218* 275* 351*  BILITOT 0.5 0.3 0.6 0.7  PROT 6.2* 6.3* 7.3 7.1  ALBUMIN 1.6* 1.7* 1.8* 1.7*   No results for input(s): LIPASE, AMYLASE in the last 168 hours. No results for input(s): AMMONIA in the last 168 hours. Coagulation Profile:  Recent Labs Lab 04/13/16 0537  INR 1.30   Cardiac Enzymes: No results for input(s): CKTOTAL, CKMB, CKMBINDEX, TROPONINI in the last 168 hours. BNP (last 3 results) No results for input(s): PROBNP in the last 8760 hours. HbA1C: No results for input(s): HGBA1C in the last 72 hours. CBG: No results for input(s): GLUCAP in the last 168 hours. Lipid Profile: No results for input(s): CHOL, HDL, LDLCALC, TRIG, CHOLHDL, LDLDIRECT in the last 72 hours. Thyroid Function Tests: No results for input(s): TSH, T4TOTAL, FREET4, T3FREE, THYROIDAB in the last 72 hours. Anemia Panel: No results for input(s): VITAMINB12, FOLATE, FERRITIN, TIBC, IRON, RETICCTPCT in the last 72 hours. Sepsis Labs: No results for input(s): PROCALCITON, LATICACIDVEN in the last 168 hours.  Recent Results (from the past 240 hour(s))  Urine culture     Status: None   Collection Time:  04/08/16 10:58 PM  Result Value Ref Range Status   Specimen Description URINE, CLEAN CATCH  Final   Special Requests NONE  Final   Culture   Final    NO GROWTH Performed at [facility], [address], [place], [place] [address]    Report Status 04/10/2016 FINAL  Final  Blood culture (routine x 2)     Status: None   Collection Time: 04/09/16  1:23 AM  Result Value Ref Range Status   Specimen Description BLOOD  LEFT FOREARM  Final   Special Requests IN PEDIATRIC BOTTLE 2ML  Final   Culture   Final    NO GROWTH 5 DAYS Performed at [facility], [place] [address], [place], [place] [address]    Report Status 04/14/2016 FINAL  Final  Blood culture (routine x 2)     Status: None   Collection Time: 04/09/16  1:23 AM  Result Value Ref Range Status   Specimen Description BLOOD LEFT HAND  Final   Special Requests IN PEDIATRIC BOTTLE 2ML  Final   Culture   Final    NO GROWTH 5 DAYS Performed at [facility], [place] [address] [place], [place] [address]    Report Status 04/14/2016 FINAL  Final  Fungus Culture With Stain     Status: None (Preliminary result)   Collection Time: 04/13/16  5:20 PM  Result Value Ref Range Status   Fungus Stain Final report  Final    Comment: (NOTE) Performed At: [facility] [place], [place] [id number] Lindon Romp MD AY:[id number]    Fungus (Mycology) Culture PENDING  Incomplete   Fungal Source LIVER  Final  Fungus Culture Result     Status: None   Collection Time: 04/13/16  5:20 PM  Result Value Ref Range Status   Result 1 Comment  Final    Comment: (NOTE) KOH/Calcofluor preparation:  no fungus observed. Performed At: [facility] [place], [place] [id number] Lindon Romp MD RK:[id number]   Culture, blood (Routine X 2) w Reflex to ID Panel     Status: None (Preliminary result)   Collection Time: 04/14/16  6:56 AM  Result Value Ref Range Status   Specimen Description BLOOD LEFT  HAND  Final   Special Requests IN PEDIATRIC BOTTLE 3CC  Final   Culture   Final    NO GROWTH 2 DAYS Performed at [facility], [place] [address] [place], [place] [address]    Report Status PENDING  Incomplete  Culture, blood (Routine X 2) w Reflex to ID Panel     Status: None (Preliminary result)   Collection Time: 04/14/16  8:04 AM  Result Value Ref Range Status   Specimen Description BLOOD RIGHT ARM  Final   Special Requests IN PEDIATRIC BOTTLE 4CC  Final   Culture   Final    NO GROWTH 2 DAYS Performed at [facility], [place] [address] [place], [place] [address]    Report Status PENDING  Incomplete     Radiology Studies: Ct Soft Tissue Neck W Contrast  Result Date: 04/14/2016 CLINICAL DATA:  Squamous cell carcinoma of the neck.  Fever. EXAM: CT NECK WITH CONTRAST TECHNIQUE: Multidetector CT imaging of the neck was performed using the standard protocol following the bolus administration of intravenous contrast. CONTRAST:  72mL ISOVUE-300 IOPAMIDOL (ISOVUE-300) INJECTION 61% COMPARISON:  Head CT 03/10/2016 FINDINGS: Pharynx and larynx: The nasopharynx is clear. The oropharynx and hypopharynx are normal. The epiglottis, supraglottic larynx, glottis and subglottic larynx are normal. No retropharyngeal collection. The parapharyngeal spaces are preserved. The visible oral cavity and base of tongue are normal. Salivary glands: The posterior aspect of the right parotid gland is involved by the below described posterior right neck process. The left parotid gland and both submandibular glands are normal. Thyroid: Normal. Lymph nodes/superficial soft tissues: There is a large, predominantly necrotic mass in the posterior right neck that measures 6.2 x 9.7 x 9.6 cm. There are multiple nearby  centrally necrotic lesions, the largest of which is at the posterior aspect of the right parotid gland that measures 2.9 x 1.8 cm. Abnormal soft tissue extends anteriorly and medially into the right carotid  space. Additional satellite lesions are at the right posterior midline of the neck measuring 8 x 5 mm each. There is a 1 cm necrotic lymph node at right level 5A. Vascular: The right internal carotid artery is in close proximity to the above-described mass, which likely at least abuts the artery. It is difficult to discern whether there is encasement. Limited intracranial: Normal. Visualized orbits: Normal. Mastoids and visualized paranasal sinuses: Clear. Skeleton: There is no bony spinal canal stenosis. No lytic or blastic lesions. Upper chest: Normal. Other: None. IMPRESSION: 1. Massive right posterior neck mass that is almost completely necrotic and measures up to 9.6 cm, consistent with known squamous cell carcinoma. Multiple satellite lesions in the right parotid space and posterior neck are likely necrotic metastatic lymph nodes. 2. Soft tissue edema and stranding surrounding the above-described lesion. Infection superimposed on necrotic malignancy is a possibility. 3. Close proximity of the right neck mass to the right internal carotid artery, though it is difficult to tell whether the artery is encased. Electronically Signed   By: Ulyses Jarred M.D.   On: 04/14/2016 23:51    Scheduled Meds: . calcitonin (salmon)  1 spray Alternating Nares Daily  .  ceFAZolin (ANCEF) IV  1 g Intravenous Q8H  . feeding supplement (ENSURE ENLIVE)  237 mL Oral BID BM  . fluconazole  100 mg Oral Daily  . gi cocktail  30 mL Oral Once  .  HYDROmorphone (DILAUDID) injection  1 mg Intravenous Once  . magnesium chloride  2 tablet Oral BID  . mirtazapine  15 mg Oral QHS  . pantoprazole  40 mg Oral BID  . polyethylene glycol  17 g Oral Daily  . senna  1 tablet Oral QHS  . sodium chloride flush  3 mL Intravenous Q12H  . sucralfate  1 g Oral TID WC & HS   Continuous Infusions:   LOS: 8 days    Chipper Oman, MD Pager: Text Page via [email or web address]  [phone]  If 7PM-7AM, please contact  night-coverage [email or web address] Password [email or web address] 04/16/2016, 2:42 PM

## 2016-04-17 ENCOUNTER — Inpatient Hospital Stay (HOSPITAL_COMMUNITY): Payer: Medicaid Other

## 2016-04-17 DIAGNOSIS — R64 Cachexia: Secondary | ICD-10-CM

## 2016-04-17 LAB — CBC WITH DIFFERENTIAL/PLATELET
BASOS PCT: 0 %
Basophils Absolute: 0 10*3/uL (ref 0.0–0.1)
Eosinophils Absolute: 0 10*3/uL (ref 0.0–0.7)
Eosinophils Relative: 0 %
HEMATOCRIT: 29.1 % — AB (ref 39.0–52.0)
Hemoglobin: 9.4 g/dL — ABNORMAL LOW (ref 13.0–17.0)
LYMPHS ABS: 1.3 10*3/uL (ref 0.7–4.0)
Lymphocytes Relative: 4 %
MCH: 26.4 pg (ref 26.0–34.0)
MCHC: 32.3 g/dL (ref 30.0–36.0)
MCV: 81.7 fL (ref 78.0–100.0)
MONO ABS: 0.9 10*3/uL (ref 0.1–1.0)
MONOS PCT: 3 %
NEUTROS ABS: 26.6 10*3/uL — AB (ref 1.7–7.7)
Neutrophils Relative %: 92 %
Platelets: 664 10*3/uL — ABNORMAL HIGH (ref 150–400)
RBC: 3.56 MIL/uL — ABNORMAL LOW (ref 4.22–5.81)
RDW: 18.7 % — ABNORMAL HIGH (ref 11.5–15.5)
WBC: 28.8 10*3/uL — ABNORMAL HIGH (ref 4.0–10.5)

## 2016-04-17 LAB — BASIC METABOLIC PANEL
Anion gap: 8 (ref 5–15)
BUN: 7 mg/dL (ref 6–20)
CALCIUM: 10.2 mg/dL (ref 8.9–10.3)
CO2: 27 mmol/L (ref 22–32)
CREATININE: 0.52 mg/dL — AB (ref 0.61–1.24)
Chloride: 100 mmol/L — ABNORMAL LOW (ref 101–111)
GFR calc non Af Amer: >60 mL/min (ref >60)
GLUCOSE: 116 mg/dL — AB (ref 65–99)
Potassium: 4.4 mmol/L (ref 3.5–5.1)
Sodium: 135 mmol/L (ref 135–145)

## 2016-04-17 LAB — ALBUMIN: Albumin: 1.8 g/dL — ABNORMAL LOW (ref 3.5–5.0)

## 2016-04-17 MED ORDER — HYDROMORPHONE HCL 1 MG/ML IJ SOLN
0.5000 mg | INTRAMUSCULAR | Status: DC | PRN
  Administered 2016-04-17 – 2016-04-18 (×6): 0.5 mg via INTRAVENOUS
  Filled 2016-04-17 (×6): qty 0.5

## 2016-04-17 MED ORDER — PREDNISOLONE 5 MG PO TABS
20.0000 mg | ORAL_TABLET | Freq: Every day | ORAL | Status: DC
  Administered 2016-04-17 – 2016-04-18 (×2): 20 mg via ORAL
  Filled 2016-04-17 (×2): qty 4

## 2016-04-17 MED ORDER — DEXAMETHASONE 4 MG PO TABS: 2.0000 mg | ORAL_TABLET | Freq: Every day | ORAL | Status: DC

## 2016-04-17 NOTE — Progress Notes (Signed)
Daily Progress Note   Patient Name: Johnny Mooney       Date: 04/17/2016 DOB: May 27, 1970  Age: 46 y.o. 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  Subjective: Awake, lying on one side. Asking for pain medicine.  Mother at bedside, patient did not rest well overnight, patient asking for his pain regimen to be switched back to Dilaudid, he does not want Fentanyl.  Patient and mother do not wish to engage in any further discussions this am, patient becomes restless and frustrated easily.   Length of Stay: 9  Current Medications: Scheduled Meds:  . calcitonin  4 Units/kg Subcutaneous Daily  .  ceFAZolin (ANCEF) IV  1 g Intravenous Q8H  . feeding supplement (ENSURE ENLIVE)  237 mL Oral BID BM  . fluconazole  100 mg Oral Daily  . gi cocktail  30 mL Oral Once  . magnesium chloride  2 tablet Oral BID  . mirtazapine  15 mg Oral QHS  . pantoprazole  40 mg Oral BID  . polyethylene glycol  17 g Oral Daily  . senna  1 tablet Oral QHS  . sodium chloride flush  3 mL Intravenous Q12H  . sucralfate  1 g Oral TID WC & HS    Continuous Infusions: . sodium chloride 100 mL/hr at 04/16/16 1814    PRN Meds: acetaminophen, bisacodyl, diphenhydrAMINE, HYDROmorphone (DILAUDID) injection, ibuprofen, lidocaine, magic mouthwash, ondansetron **OR** ondansetron (ZOFRAN) IV, oxyCODONE-acetaminophen  Physical Exam         Awake alert thin young gentleman Large dressing on the back of his  head S1-S2 Clear No edema Thin muscle wasting evident Awake alert  Vital Signs: BP (!) 116/59 (BP Location: Left Arm)   Pulse (!) 119   Temp 100.2 F (37.9 C) (Oral)   Resp 20   Ht 5\' 10"  (1.778 m)   Wt 64.9 kg (143 lb)   SpO2 98%   BMI 20.52 kg/m  SpO2: SpO2: 98 % O2 Device: O2 Device: Not Delivered O2 Flow Rate: O2 Flow Rate (L/min): 2 L/min  Intake/output summary:   Intake/Output Summary (Last  24 hours) at 04/17/16 0948 Last data filed at 04/17/16 0703  Gross per 24 hour  Intake          1636.66 ml  Output             1975 ml  Net          -338.34 ml   LBM: Last BM Date: 04/16/16 Baseline Weight: Weight: 64.9 kg (143 lb) Most recent weight: Weight: 64.9 kg (143 lb)       Palliative Assessment/Data:    Flowsheet Rows     Most Recent Value  Intake Tab  Referral Department  Hospitalist  Unit at Time of Referral  Oncology Unit  Palliative Care Primary Diagnosis  Cancer  Date Notified  04/14/16  Palliative Care Type  Return patient Palliative Care  Reason for referral  Clarify Goals of Care  Date of Admission  04/08/16  Date first seen by Palliative Care  04/14/16  # of days Palliative referral response time  0 Day(s)  # of days IP prior to Palliative referral  6  Clinical Assessment  Palliative Performance Scale Score  30%  Pain Max last 24 hours  4  Pain Min Last 24 hours  3  Dyspnea Max Last 24 Hours  4  Dyspnea Min Last 24 hours  3  Psychosocial & Spiritual Assessment  Palliative Care Outcomes  Patient/Family meeting held?  Yes  Who was at the meeting?  patient, mother   Palliative Care Outcomes  Clarified goals of care      Patient Active Problem List   Diagnosis Date Noted  . Fever of undetermined origin 04/08/2016  . Hyponatremia 04/08/2016  . Leukocytosis 04/08/2016  . Protein-calorie malnutrition, severe 03/11/2016  . Chronic anemia 03/10/2016  . Headache 03/10/2016  . Tinea pedis 03/03/2016  . Malnutrition of moderate degree  02/27/2016  . Sepsis ([place]) 02/27/2016  . Fever 02/27/2016  . Hypercalcemia of malignancy 02/25/2016  . Hypercalcemia 02/25/2016  . AKI (acute kidney injury) ([place]) 02/25/2016  . 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 He wishes for full resuscitative attempt at end-of-life. He becomes uncomfortable/angry and does not wish to discuss further. His mother has flat affect and wishes to continue with patient's wishes.  I agree with the Remeron I agree with Dilaudid. Continue supportive care and active symptom management. Continue gentle goals of care discussions as patient and mother are able. Provided active listening and supportive care. Endorsed that the patient has a serious illness.  Code Status:    Code Status Orders        Start     Ordered   04/09/16 0202  Full code  Continuous     04/09/16 0203    Code Status History    Date Active Date Inactive Code Status Order ID Comments User Context   04/08/2016 11:47 PM 04/09/2016  2:03 AM Full Code [id number]  Reubin Milan, MD Inpatient   03/10/2016  5:27 PM 03/11/2016  8:40 PM Full Code [id number]  Donne Hazel, MD ED  02/25/2016 10:23 PM 03/07/2016 11:09 PM Full Code [id number]  Shon Millet, DO Inpatient   01/28/2016 10:31 PM 02/02/2016  9:29 PM Full Code [id number]  Alphonsa Overall, MD Inpatient   02/20/2012 12:59 AM 02/22/2012  3:31 PM Full Code [id number]  Rise Patience,  MD Inpatient       Prognosis:   < 3 months?  Discharge Planning:  To Be Determined  Care plan was discussed with patient, mother   Thank you for allowing the Palliative Medicine Team to assist in the care of this patient.   Time In: 8.30 Time Out: 8.55 Total Time 25 Prolonged Time Billed  no       Greater than 50%  of this time was spent counseling and coordinating care related to the above assessment and plan.  Loistine Chance, MD [phone]  Please contact Palliative Medicine Team phone at [phone] for questions and concerns.

## 2016-04-17 NOTE — Progress Notes (Signed)
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 Patient was admitted for fever of unknown origin, and further workup. Patient was treated with broad-spectrum antibiotic, favored to be associated with cellulitis of neck lesion, but since patient continued to have persistent fever CT of the abdomen was done showing liver lesions concerning for infectious versus malignancy. Patient for biopsy today.   Subjective: Patient continues to be withdrawn and non cooperative with interview. Patient report no pain at this time.   Assessment & Plan: Fever of unknown origin w/ leukocytosis - likely due to rapidly progressive active metastais. Fever improving. But WBC increasing  No findings of infection other than ? possible deep infected tissue at the base of the tumor. Seen on CT, although lesion it self does not look infected. Patient on Ancef. Patient also with some oral thrush - on diflucan thrush much better.  Blood cx so far NGTD. Continue tylenol PRN fever.   Malignant hypercalcemia - Corrected Ca+ 12.0 today, on daily calcitonin sq and IVF, .Since thrush has improved will start low dose prednisone. Continue to monitor    Severe protein calorie malnutrition/ Malignant cachexia/Gastritis Gastritis has improve where patient is tolerating diet well orally Patient with very poor appetite, he was started on mirtazapine, with no much improvement  Would consider Megace but high risk for thrombosis.   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 - Per oncology team. Last radiation therapy 04/15/16. They will consider possible palliative chemo, if patient can tolerate.     Liver metastasis - biopsy confirm metastasis - poorly differentiated squamous cell Ca  Oncology discussed case with patient and mother - will offer treatment plan next week when patient is more stable. Very poor prognosis as this is rapidly progressive. 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 Patient is very resistant on speaking about his condition. Mother understand the severity of the diseases but what to continue to respect patient wishes. Patient continues to be full code. Explained the poor chance of survival if her get heroic measures and the poor to non quality of life if does survive. Family verbalize understanding.   DVT prophylaxis: Lovenox  Code Status: FULL  Family Communication: Mother at bedside  Disposition Plan: Monitoring Ca+ if improvement will d/c home in the next 48 hrs with follow up w Dr Alvy Bimler.  Consultants:   Oncology   IR   Procedures:     Antimicrobials:     Objective: Vitals:   04/16/16 2032 04/17/16 0703 04/17/16 1444 04/17/16 1640  BP: 99/63 (!) 116/59 115/71   Pulse: 95 (!) 119 (!) 120   Resp: 20 20 (!) 22   Temp: 98.5 F (36.9 C) 100.2 F (37.9 C) (!) 101.2 F (38.4 C) 99.5 F (37.5 C)  TempSrc: Oral Oral Oral Oral  SpO2: 98% 98% 97%   Weight:      Height:        Intake/Output Summary (Last 24 hours) at 04/17/16 1739 Last data filed at 04/17/16  1500  Gross per 24 hour  Intake          2586.66 ml  Output             2175 ml  Net           411.66 ml   Filed Weights   04/08/16 1723  Weight: 64.9 kg (143 lb)    Examination:  General exam: Lying in bed, with suction  HEENT: Tongue no visible plaques  Respiratory system: Clear  Cardiovascular system: regular, no mrg Gastrointestinal system: Soft, ND, mil epigastric and RUQ tenderness   Skin: Large dressing on posterior neck Psychiatry: Mood depressed and withdrawn    Data Reviewed: I have personally reviewed following labs and imaging studies  CBC:  Recent Labs Lab 04/13/16 0537 04/14/16 0523 04/15/16 0510 04/16/16 0810 04/17/16 0529  WBC 22.6* 22.0* 20.3* 22.1* 28.8*  NEUTROABS 19.7* 18.7* 17.7* 19.7* 26.6*  HGB 7.9* 7.9* 7.1* 9.7* 9.4*  HCT 24.3* 24.4* 22.3* 29.5* 29.1*  MCV 79.9 79.7 80.5 81.7 81.7  PLT 651* 674* 630* 620* 882*   Basic Metabolic Panel:  Recent Labs Lab 04/13/16 0537 04/14/16 0523 04/15/16 0510 04/16/16 0810 04/17/16 0529  NA 133* 134* 132* 132* 135  K 4.3 4.3 4.3 4.2 4.4  CL 99* 96* 95* 97* 100*  CO2 28 29 30 28 27   GLUCOSE 100* 112* 102* 105* 116*  BUN <5* 7 6 6 7   CREATININE 0.42* 0.58* 0.52* 0.51* 0.52*  CALCIUM 9.8 10.3 10.3 10.8* 10.2  MG  --   --   --  1.8  --   PHOS  --   --   --  2.3*  --    GFR: Estimated Creatinine Clearance: 109.3 mL/min (A) (by C-G formula based on SCr of 0.52 mg/dL (L)). Liver Function Tests:  Recent Labs Lab 04/13/16 0537 04/14/16 0523 04/16/16 0810 04/17/16 0529  AST 20 26 35  --   ALT 25 24 28   --   ALKPHOS 218* 275* 351*  --   BILITOT 0.3 0.6 0.7  --   PROT 6.3* 7.3 7.1  --   ALBUMIN 1.7* 1.8* 1.7* 1.8*   No results for input(s): LIPASE, AMYLASE in the last 168 hours. No results for input(s): AMMONIA in the last 168 hours. Coagulation Profile:  Recent Labs Lab 04/13/16 0537  INR 1.30   Cardiac Enzymes: No results for input(s): CKTOTAL, CKMB, CKMBINDEX, TROPONINI in the last 168 hours. BNP (last 3 results) No results for input(s): PROBNP in the last 8760 hours. HbA1C: No results for input(s): HGBA1C in the last 72 hours. CBG: No results for input(s): GLUCAP in the last 168 hours. Lipid Profile: No results for input(s): CHOL, HDL, LDLCALC, TRIG, CHOLHDL, LDLDIRECT in the last 72 hours. Thyroid Function Tests: No results for input(s): TSH, T4TOTAL, FREET4, T3FREE,  THYROIDAB in the last 72 hours. Anemia Panel: No results for input(s): VITAMINB12, FOLATE, FERRITIN, TIBC, IRON, RETICCTPCT in the last 72 hours. Sepsis Labs: No results for input(s): PROCALCITON, LATICACIDVEN in the last 168 hours.  Recent Results (from the past 240 hour(s))  Urine culture     Status: None   Collection Time: 04/08/16 10:58 PM  Result Value Ref Range Status   Specimen Description URINE, CLEAN CATCH  Final   Special Requests NONE  Final   Culture   Final    NO GROWTH Performed at [facility], [address], [place], [place] [address]    Report Status 04/10/2016 FINAL  Final  Blood culture (routine x 2)     Status: None   Collection Time: 04/09/16  1:23 AM  Result Value Ref Range Status   Specimen Description BLOOD LEFT FOREARM  Final   Special Requests IN PEDIATRIC BOTTLE 2ML  Final   Culture   Final    NO GROWTH 5 DAYS Performed at [facility], [place] [address] [place], [place] [address]    Report Status 04/14/2016 FINAL  Final  Blood culture (routine x 2)     Status: None   Collection Time: 04/09/16  1:23 AM  Result Value Ref Range Status   Specimen Description BLOOD LEFT HAND  Final   Special Requests IN PEDIATRIC BOTTLE 2ML  Final   Culture   Final    NO GROWTH 5 DAYS Performed at [facility], [place] [address], [place], [place] [address]    Report Status 04/14/2016 FINAL  Final  Fungus Culture With Stain     Status: None (Preliminary result)   Collection Time: 04/13/16  5:20 PM  Result Value Ref Range Status   Fungus Stain Final report  Final    Comment: (NOTE) Performed At: [facility] [place], [place] [id number] Lindon Romp MD BW:[id number]    Fungus (Mycology) Culture PENDING  Incomplete   Fungal Source LIVER  Final  Fungus Culture Result     Status: None   Collection Time: 04/13/16  5:20 PM  Result Value Ref Range Status   Result 1 Comment  Final    Comment: (NOTE) KOH/Calcofluor preparation:   no fungus observed. Performed At: [facility] [place], [place] [id number] Lindon Romp MD SP:[id number]   Culture, blood (Routine X 2) w Reflex to ID Panel     Status: None (Preliminary result)   Collection Time: 04/14/16  6:56 AM  Result Value Ref Range Status   Specimen Description BLOOD LEFT HAND  Final   Special Requests IN PEDIATRIC BOTTLE 3CC  Final   Culture   Final    NO GROWTH 3 DAYS Performed at [facility], [place] [address] [place], [place] [address]    Report Status PENDING  Incomplete  Culture, blood (Routine X 2) w Reflex to ID Panel     Status: None (Preliminary result)   Collection Time: 04/14/16  8:04 AM  Result Value Ref Range Status   Specimen Description BLOOD RIGHT ARM  Final   Special Requests IN PEDIATRIC BOTTLE 4CC  Final   Culture   Final    NO GROWTH 3 DAYS Performed at [facility], [place] [address] [place], [place] [address]    Report Status PENDING  Incomplete     Radiology Studies: Dg Chest Port 1 View  Result Date: 04/17/2016 CLINICAL DATA:  Cough today EXAM: PORTABLE CHEST 1 VIEW COMPARISON:  04/14/2016 FINDINGS: Heart size is normal. Lungs are free of focal consolidations and pleural effusions. No pulmonary edema. IMPRESSION: No active disease. Electronically Signed   By: Nolon Nations M.D.   On: 04/17/2016 14:11    Scheduled Meds: . calcitonin  4 Units/kg Subcutaneous Daily  .  ceFAZolin (ANCEF) IV  1 g Intravenous Q8H  . feeding supplement (ENSURE ENLIVE)  237 mL Oral BID BM  . fluconazole  100 mg Oral Daily  . gi cocktail  30 mL Oral Once  . magnesium chloride  2 tablet Oral BID  . mirtazapine  15 mg Oral QHS  . pantoprazole  40 mg Oral BID  . polyethylene glycol  17 g Oral Daily  . prednisoLONE  20 mg Oral Daily  . senna  1 tablet Oral QHS  . sodium chloride flush  3 mL Intravenous Q12H  . sucralfate  1 g Oral TID WC & HS   Continuous Infusions: . sodium chloride 100 mL/hr at 04/16/16 1814       LOS: 9 days   Time spent 55 minutes, 50% face to face coordinating and discussing goals of care   Chipper Oman, MD Pager: Text Page via [email or web address]  [phone]  If 7PM-7AM, please contact night-coverage [email or web address] Password [facility] 04/17/2016, 5:39 PM

## 2016-04-18 ENCOUNTER — Encounter: Payer: Self-pay | Admitting: *Deleted

## 2016-04-18 LAB — BPAM RBC
Blood Product Expiration Date: 201804182359
Blood Product Expiration Date: 201804182359
ISSUE DATE / TIME: 201803231200
ISSUE DATE / TIME: 201803240230
UNIT TYPE AND RH: 5100
Unit Type and Rh: 5100

## 2016-04-18 LAB — COMPREHENSIVE METABOLIC PANEL
ALK PHOS: 431 U/L — AB (ref 38–126)
ALT: 33 U/L (ref 17–63)
AST: 39 U/L (ref 15–41)
Albumin: 1.8 g/dL — ABNORMAL LOW (ref 3.5–5.0)
Anion gap: 8 (ref 5–15)
BILIRUBIN TOTAL: 0.4 mg/dL (ref 0.3–1.2)
BUN: 8 mg/dL (ref 6–20)
CALCIUM: 9.8 mg/dL (ref 8.9–10.3)
CO2: 23 mmol/L (ref 22–32)
Chloride: 103 mmol/L (ref 101–111)
Creatinine, Ser: 0.5 mg/dL — ABNORMAL LOW (ref 0.61–1.24)
GFR calc Af Amer: >60 mL/min (ref >60)
GLUCOSE: 133 mg/dL — AB (ref 65–99)
POTASSIUM: 4.6 mmol/L (ref 3.5–5.1)
Sodium: 134 mmol/L — ABNORMAL LOW (ref 135–145)
Total Protein: 6.9 g/dL (ref 6.5–8.1)

## 2016-04-18 LAB — TYPE AND SCREEN
ABO/RH(D): O POS
ANTIBODY SCREEN: NEGATIVE
UNIT DIVISION: 0
Unit division: 0

## 2016-04-18 LAB — CBC WITH DIFFERENTIAL/PLATELET
BASOS PCT: 0 %
Basophils Absolute: 0 10*3/uL (ref 0.0–0.1)
Eosinophils Absolute: 0 10*3/uL (ref 0.0–0.7)
Eosinophils Relative: 0 %
HEMATOCRIT: 29.4 % — AB (ref 39.0–52.0)
HEMOGLOBIN: 9.3 g/dL — AB (ref 13.0–17.0)
LYMPHS ABS: 0.3 10*3/uL — AB (ref 0.7–4.0)
Lymphocytes Relative: 1 %
MCH: 26.1 pg (ref 26.0–34.0)
MCHC: 31.6 g/dL (ref 30.0–36.0)
MCV: 82.4 fL (ref 78.0–100.0)
MONO ABS: 0.9 10*3/uL (ref 0.1–1.0)
MONOS PCT: 3 %
NEUTROS ABS: 29.9 10*3/uL — AB (ref 1.7–7.7)
Neutrophils Relative %: 96 %
Platelets: 698 10*3/uL — ABNORMAL HIGH (ref 150–400)
RBC: 3.57 MIL/uL — ABNORMAL LOW (ref 4.22–5.81)
RDW: 19.1 % — AB (ref 11.5–15.5)
WBC: 31.1 10*3/uL — AB (ref 4.0–10.5)

## 2016-04-18 MED ORDER — PANTOPRAZOLE SODIUM 40 MG PO TBEC: 40.0000 mg | DELAYED_RELEASE_TABLET | Freq: Two times a day (BID) | ORAL | 0 refills | Status: AC

## 2016-04-18 MED ORDER — ACETAMINOPHEN 325 MG PO TABS: 650.0000 mg | ORAL_TABLET | ORAL | Status: AC | PRN

## 2016-04-18 MED ORDER — HYDROMORPHONE HCL 1 MG/ML IJ SOLN: 0.5000 mg | INTRAMUSCULAR | 0 refills | Status: AC | PRN

## 2016-04-18 MED ORDER — IBUPROFEN 200 MG PO TABS: 200.0000 mg | ORAL_TABLET | ORAL | 0 refills | Status: AC | PRN

## 2016-04-18 MED ORDER — FLUCONAZOLE 100 MG PO TABS: 100.0000 mg | ORAL_TABLET | Freq: Every day | ORAL | Status: AC

## 2016-04-18 MED ORDER — MIRTAZAPINE 15 MG PO TABS: 15.0000 mg | ORAL_TABLET | Freq: Every day | ORAL | Status: AC

## 2016-04-18 MED ORDER — SODIUM CHLORIDE 0.9 % IV SOLN: 100.0000 mL | INTRAVENOUS | 0 refills | Status: AC

## 2016-04-18 MED ORDER — CALCITONIN (SALMON) 200 UNIT/ML IJ SOLN: 4.0000 [IU]/kg | Freq: Every day | INTRAMUSCULAR | Status: AC

## 2016-04-18 MED ORDER — PREDNISOLONE 5 MG PO TABS: 20.0000 mg | ORAL_TABLET | Freq: Every day | ORAL | 0 refills | Status: AC

## 2016-04-18 NOTE — Progress Notes (Signed)
Pt transferred to [facility] by [facility].  Paperwork including discharge summary given to carelink transporter.  AVS given to mother.  Report called to Denny Peon, RN at the cancer center.  All questions answered.  VSS.

## 2016-04-18 NOTE — Progress Notes (Signed)
Initial Nutrition Assessment  DOCUMENTATION CODES:   Severe malnutrition in context of acute illness/injury  INTERVENTION:   -Continue Ensure Enlive po BID, each supplement provides 350 kcal and 20 grams of protein -Provide Magic cup TID with meals, each supplement provides 290 kcal and 9 grams of protein -Encourage PO intake -RD to continue to monitor  NUTRITION DIAGNOSIS:   Malnutrition related to acute illness as evidenced by percent weight loss, severe depletion of body fat, severe depletion of muscle mass.  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 Patient was admitted for fever of unknown origin, and further workup. Patient was treated with broad-spectrum antibiotic, favored to be associated with cellulitis of neck lesion, but since patient continued to have persistent fever CT of the abdomen was done showing liver lesions concerning for infectious versus malignancy. Patient for biopsy today.   Patient in room with cousin and mother at bedside. Per pt's mother, the pt has mainly been consuming 1 Ensure a day and maybe some fruit like strawberries recently. Pt drinks plenty of fluids like water and ginger ale. Pt is currently sipping on a Magic cup mixed with milk. Pt denies swallowing issues presently but has developed thrush.   Per chart review, pt has lost 9 lb since 2/23 (6% wt loss x 1 month, significant for time frame). Nutrition-Focused physical exam completed. Findings are severe fat depletion, severe muscle depletion, and no edema.   Per chart, pt and pt's family interested in  transferring another hospital. Will monitor for plan.  Medications: Slow-Mag tablet BID, Remeron tablet daily, Protonix tablet BID, Miralax packet daily, Senokot tablet daily, IV Zofran PRN Labs reviewed: Low Na  Diet Order:  Diet - low sodium heart healthy Diet regular Room service appropriate? Yes; Fluid consistency: Thin  Skin:  Wound (see comment) (3/17 neck incision)  Last BM:  3/24  Height:   Ht Readings from Last 1 Encounters:  04/08/16 5\' 10"  (1.778 m)    Weight:   Wt Readings from Last 1 Encounters:  04/08/16 143 lb (64.9 kg)    Ideal Body Weight:  75.5 kg  BMI:  Body mass index is 20.52 kg/m.  Estimated Nutritional Needs:   Kcal:  1950-2150  Protein:  100-110g  Fluid:  2L/day  EDUCATION NEEDS:   No education needs identified at this time  Clayton Bibles, MS, RD, LDN Pager: [phone] After Hours Pager: [phone]

## 2016-04-18 NOTE — Care Management Note (Signed)
Case Management Note  Patient Details  Name: Johnny Mooney MRN: [id number] Date of Birth: [birth year]  Subjective/Objective:Noted-Confirmed metastatic squamous cell ca, rapidly growing ca. FUO,leukocytosis, severe protein cal malnutrition. onc/palliative following. Patient wants aggressive chemo, wants 2nd opinion. Noted may transfer to tertiary center if there is a receiving dr.  Joaquin Courts code,prognosis,less 3 months.                    Action/Plan:d/c plan home.   Expected Discharge Date:                 Expected Discharge Plan:  Home/Self Care  In-House Referral:     Discharge planning Services  CM Consult  Post Acute Care Choice:    Choice offered to:     DME Arranged:    DME Agency:     HH Arranged:    HH Agency:     Status of Service:  In process, will continue to follow  If discussed at Long Length of Stay Meetings, dates discussed:    Additional Comments:  Dessa Phi, RN 04/18/2016, 11:01 AM

## 2016-04-18 NOTE — Discharge Summary (Signed)
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 Patient was admitted for fever of unknown origin, and further workup. Patient was treated with broad-spectrum antibiotic, favored to be associated with cellulitis of neck lesion, but since patient continued to have persistent fever CT of the abdomen was done showing liver lesions concerning for infectious versus malignancy. Lesions showed poorly differentiated squamous cell Ca oncology team offered palliative chemo, but not at this time, given patient with active fever and leukocytosis. Patient also with malignant hypercalcemia getting calcitonin, IVF and prednisone slowly improving. Family and patient requesting 2nd opinion,so patient is transferred to [facility].   Subjective: Patient seen and examined with mother and cousin at bedside. More interactive and cooperative. Want to go to a more specialized center. Pain is well controlled. Family report patient not eating much today. Fever has mildly improve.   Discharge Diagnoses/Hospital Course:  Fever of unknown origin w/ leukocytosis - likely due to rapidly progressive active metastasis.  Continue to have fever and increase in leukocytosis. Despite abx treatment. Blood cx have been negative x 2  No findings of infection other than ? possible deep infected tissue at the base of the tumor. Seen on CT, although lesion it self does not look infected. Patient on  Ancef wish was stooped on 04/18/16. Patient also with some oral thrush which improved on Diflucan day 4. Continue tylenol PRN fever. Patient will be transfer to [facility] for further management. Accepting physician Dr Tia Masker, Oncologist.   Malignant hypercalcemia - Corrected Ca+ 11.6 today, on daily calcitonin sq and IVF. Started on oral low dose Prednisone. 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 Considering Megace but high risk for thrombosis.   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 - Per oncology team. Last radiation therapy 04/15/16. Oncology team has offered palliative chemo, but patient will like to pursue more aggressive treatment. Oncologist at [facility] has been consulted and the will accept patient in transfer for further plans.   Liver and lung metastasis - Liver biopsy on 04/13/16 confirm metastasis - poorly differentiated squamous cell Ca. Very poor prognosis as this is rapidly progressive. Patient had PET scan on Jan 2018 with no lesion on his liver, now with mets to liver and lungs mets. 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 Patient open up today and he want aggressive treatment, he will like to a second opinion. Mother understand the severity of the diseases and to to respect patient wishes. Patient continues to be full code. Explained the poor chance of survival if he gets heroic measures and the poor to non quality of life if does survive. Family verbalize understanding.   Discharge Instructions  You were cared for by a hospitalist during your hospital stay. If you have  any questions about your discharge medications or the care you received while you were in the hospital after you are discharged, you can call the unit and asked to speak with the hospitalist on call if the hospitalist that took care of you is not available. Once you are discharged, your primary care physician will handle any further medical issues. Please note that NO REFILLS for any discharge medications will be authorized once you are discharged, as it is imperative that you return to your primary care physician (or establish a relationship with a primary care physician if you do not have one) for your aftercare needs so that they can reassess your need for medications and monitor your lab values.  Discharge Instructions    Diet - low sodium heart healthy    Complete by:  As directed    Diet general    Complete by:  As directed    Discharge instructions    Complete by:  As directed    Please follow with primary care in 7 days.   Increase activity slowly    Complete by:  As directed      Allergies as of 04/18/2016   No Known Allergies     Medication List    STOP taking these medications   naproxen 500 MG tablet Commonly known as:  NAPROSYN     TAKE these medications   acetaminophen 325 MG tablet Commonly known as:  TYLENOL Take 2 tablets (650 mg total) by mouth every 4 (four) hours as needed for fever.   calcitonin 200 UNIT/ML injection Commonly known as:  MIACALCIN Inject 1.3 mLs (260 Units total) into the skin daily. Start taking on:  04/19/2016   feeding supplement (ENSURE ENLIVE) Liqd Take 237 mLs by mouth 2 (two) times daily between meals.   fluconazole 100 MG tablet Commonly known as:  DIFLUCAN Take 1 tablet (100 mg total) by mouth daily. Start taking on:  04/19/2016   HYDROmorphone 1 MG/ML injection Commonly known as:  DILAUDID Inject 0.5 mLs (0.5 mg total) into the vein every 2 (two) hours as needed for moderate pain.   ibuprofen 200 MG tablet Commonly known as:   ADVIL,MOTRIN Take 1 tablet (200 mg total) by mouth every 4 (four) hours as needed for fever.   lidocaine 2 % solution Commonly known as:  XYLOCAINE Pharmacy: Mix 50:50 with water.  Patient: Swish and swallow 68mL of this mixture, 59min before meals and at bedtime, up to QID.   magic mouthwash Soln Take 5 mLs by mouth 3 (three) times daily as needed for mouth pain.   magnesium chloride 64 MG Tbec SR tablet Commonly known as:  SLOW-MAG Take 2 tablets (128 mg total) by mouth 2 (two) times daily.   mirtazapine 15 MG tablet Commonly known as:  REMERON Take 1 tablet (15 mg total) by mouth at bedtime.   ondansetron 4 MG disintegrating tablet Commonly known as:  ZOFRAN-ODT Take 1 tablet (4 mg total) by mouth every 8 (eight) hours as needed for nausea.   oxyCODONE-acetaminophen 5-325 MG tablet Commonly known as:  PERCOCET/ROXICET Take 1 tablet by mouth every 4 (four) hours as needed for severe pain.   pantoprazole 40 MG tablet Commonly known as:  PROTONIX Take 1 tablet (40 mg total) by mouth 2 (two) times daily.   polyethylene glycol packet Commonly known as:  MIRALAX / GLYCOLAX Take 17 g by mouth daily.  prednisoLONE 5 MG Tabs tablet Take 4 tablets (20 mg total) by mouth daily. Start taking on:  04/19/2016   senna 8.6 MG Tabs tablet Commonly known as:  SENOKOT Take 1 tablet (8.6 mg total) by mouth at bedtime.   sodium chloride 0.9 % infusion Inject 100 mLs into the vein continuous.   sucralfate 1 g tablet Commonly known as:  CARAFATE Take 1 tablet (1 g total) by mouth 4 (four) times daily -  with meals and at bedtime.   terbinafine 1 % cream Commonly known as:  LAMISIL Apply topically 2 (two) times daily. Use for up to 2 weeks.      Follow-up Information    Arnoldo Morale, MD Follow up in 1 week(s).   Specialty:  Family Medicine Contact information: [place] [address] [phone]          No Known  Allergies  Consultations:  Oncology    Procedures/Studies: Dg Chest 2 View  Result Date: 04/08/2016 CLINICAL DATA:  Chest pain EXAM: CHEST  2 VIEW COMPARISON:  02/27/2016 FINDINGS: The heart size and mediastinal contours are within normal limits. Both lungs are clear. The visualized skeletal structures are unremarkable. Probable linear artifacts on the lateral view posteriorly. IMPRESSION: No active cardiopulmonary disease. Electronically Signed   By: Donavan Foil M.D.   On: 04/08/2016 18:24   Ct Soft Tissue Neck W Contrast  Result Date: 04/14/2016 CLINICAL DATA:  Squamous cell carcinoma of the neck.  Fever. EXAM: CT NECK WITH CONTRAST TECHNIQUE: Multidetector CT imaging of the neck was performed using the standard protocol following the bolus administration of intravenous contrast. CONTRAST:  67mL ISOVUE-300 IOPAMIDOL (ISOVUE-300) INJECTION 61% COMPARISON:  Head CT 03/10/2016 FINDINGS: Pharynx and larynx: The nasopharynx is clear. The oropharynx and hypopharynx are normal. The epiglottis, supraglottic larynx, glottis and subglottic larynx are normal. No retropharyngeal collection. The parapharyngeal spaces are preserved. The visible oral cavity and base of tongue are normal. Salivary glands: The posterior aspect of the right parotid gland is involved by the below described posterior right neck process. The left parotid gland and both submandibular glands are normal. Thyroid: Normal. Lymph nodes/superficial soft tissues: There is a large, predominantly necrotic mass in the posterior right neck that measures 6.2 x 9.7 x 9.6 cm. There are multiple nearby centrally necrotic lesions, the largest of which is at the posterior aspect of the right parotid gland that measures 2.9 x 1.8 cm. Abnormal soft tissue extends anteriorly and medially into the right carotid space. Additional satellite lesions are at the right posterior midline of the neck measuring 8 x 5 mm each. There is a 1 cm necrotic lymph node at  right level 5A. Vascular: The right internal carotid artery is in close proximity to the above-described mass, which likely at least abuts the artery. It is difficult to discern whether there is encasement. Limited intracranial: Normal. Visualized orbits: Normal. Mastoids and visualized paranasal sinuses: Clear. Skeleton: There is no bony spinal canal stenosis. No lytic or blastic lesions. Upper chest: Normal. Other: None. IMPRESSION: 1. Massive right posterior neck mass that is almost completely necrotic and measures up to 9.6 cm, consistent with known squamous cell carcinoma. Multiple satellite lesions in the right parotid space and posterior neck are likely necrotic metastatic lymph nodes. 2. Soft tissue edema and stranding surrounding the above-described lesion. Infection superimposed on necrotic malignancy is a possibility. 3. Close proximity of the right neck mass to the right internal carotid artery, though it is difficult to tell  whether the artery is encased. Electronically Signed   By: Ulyses Jarred M.D.   On: 04/14/2016 23:51   Ct Angio Chest Pe W And/or Wo Contrast  Result Date: 04/08/2016 CLINICAL DATA:  46 year old male with chest pain. Evaluate for PE. History of squamous cell carcinoma of the skin. EXAM: CT ANGIOGRAPHY CHEST WITH CONTRAST TECHNIQUE: Multidetector CT imaging of the chest was performed using the standard protocol during bolus administration of intravenous contrast. Multiplanar CT image reconstructions and MIPs were obtained to evaluate the vascular anatomy. CONTRAST:  100 cc Isovue 370 COMPARISON:  Chest radiograph dated 04/08/2016 FINDINGS: Cardiovascular: There is no cardiomegaly or pericardial effusion. The thoracic aorta appears unremarkable. The origins of the great vessels of the aortic arch appear patent. There is mild dilatation of the main pulmonary trunk suggestive of mild underlying pulmonary hypertension. There is no CT evidence of pulmonary embolism. Mediastinum/Nodes:  There is no hilar or mediastinal adenopathy. There is mild thickened appearance of the esophagus which may be related to underdistention. Clinical correlation is recommended to evaluate for esophagitis. The thyroid gland is grossly unremarkable. Lungs/Pleura: There are multiple small scattered bilateral pulmonary nodules measuring up to 6 mm in the left lower lobe (series 11, image 45). There is no focal consolidation, pleural effusion, or pneumothorax. The central airways are patent. Upper Abdomen: Multiple low attenuating liver lesions noted measuring up to 18 x 16 mm compatible with metastatic disease in this patient with known metastatic squamous cell carcinoma. The visualized upper abdomen is otherwise unremarkable. Musculoskeletal: There is loss of subcutaneous fat. The osseous structures are intact. Review of the MIP images confirms the above findings. IMPRESSION: 1. No CT evidence of pulmonary embolism. 2. Multiple small scattered bilateral pulmonary nodules measuring up to 6 mm. These nodules may be reactive or represent metastatic disease. 3. Mildly prominent central pulmonary arteries suggestive of underlying pulmonary hypertension. 4. Multiple hepatic hypodense lesions most consistent with metastatic disease. Electronically Signed   By: Anner Crete M.D.   On: 04/08/2016 21:01   US Biopsy  Result Date: 04/14/2016 INDICATION: 46 year old male with multifocal hepatic lesions which are rapidly enlarging. In the setting of a large neck squamous cell carcinoma these are concerning for metastases. Given the relatively rapid growth infection is also possible. EXAM: Ultrasound-guided core biopsy of liver lesion MEDICATIONS: None. ANESTHESIA/SEDATION: Moderate (conscious) sedation was employed during this procedure. A total of Versed 2.5 mg and Fentanyl 75 mcg was administered intravenously. Moderate Sedation Time: 10 minutes. The patient's level of consciousness and vital signs were monitored  continuously by radiology nursing throughout the procedure under my direct supervision. FLUOROSCOPY TIME:  Fluoroscopy Time: 0 minutes 0 seconds (0 mGy). COMPLICATIONS: None immediate. PROCEDURE: Informed written consent was obtained from the patient after a thorough discussion of the procedural risks, benefits and alternatives. All questions were addressed. Maximal Sterile Barrier Technique was utilized including caps, mask, sterile gowns, sterile gloves, sterile drape, hand hygiene and skin antiseptic. A timeout was performed prior to the initiation of the procedure. The abdomen was interrogated with ultrasound. There are numerous solid hypoechoic hepatic lesions. A suitable lesion was identified in the skin entry site selected and marked. The skin surface was sterilely prepped and draped in standard fashion using chlorhexidine skin prep. Local anesthesia was attained by infiltration with 1% lidocaine. A small dermatotomy was made. Under real-time sonographic guidance a 17 gauge introducer needle was advanced through the margin of the liver and positioned at the edge of the lesion. Multiple 18 gauge core biopsies  were then obtained coaxially. Biopsy specimens were delivered in both formalin and saline to the lab for further analysis. As the introducer needle was removed, the biopsy tract was embolized with a Gel-Foam slurry. Post biopsy ultrasound imaging demonstrates no active complication. The patient tolerated the procedure well. IMPRESSION: Technically successful ultrasound-guided core biopsy of hepatic lesion. Of note, the lesions appear solid sonographically which favors metastatic disease over infection. Signed, Criselda Peaches, MD [facility] Electronically Signed   By: Jacqulynn Cadet M.D.   On: 04/14/2016 14:03   Dg Chest Port 1 View  Result Date: 04/17/2016 CLINICAL DATA:  Cough today EXAM: PORTABLE CHEST 1 VIEW COMPARISON:  04/14/2016  FINDINGS: Heart size is normal. Lungs are free of focal consolidations and pleural effusions. No pulmonary edema. IMPRESSION: No active disease. Electronically Signed   By: Nolon Nations M.D.   On: 04/17/2016 14:11   Dg Chest Port 1 View  Result Date: 04/14/2016 CLINICAL DATA:  46 year old male with fever of unknown origin and cough and congestion. Chest radiograph dated 04/08/2016 EXAM: PORTABLE CHEST 1 VIEW COMPARISON:  None. FINDINGS: The heart size and mediastinal contours are within normal limits. Both lungs are clear. The visualized skeletal structures are unremarkable. IMPRESSION: No active disease. Electronically Signed   By: Anner Crete M.D.   On: 04/14/2016 06:30   Ct Liver Abd W/wo  Result Date: 04/12/2016 CLINICAL DATA:  Metastatic squamous cell carcinoma of the neck. Fever. EXAM: CT ABDOMEN WITHOUT AND WITH CONTRAST TECHNIQUE: Multidetector CT imaging of the abdomen was performed following the standard protocol before and following the bolus administration of intravenous contrast. CONTRAST:  162mL ISOVUE-300 IOPAMIDOL (ISOVUE-300) INJECTION 61% COMPARISON:  Multiple exams, including CT chest from 04/08/2016 and PET-CT from 02/17/2016 FINDINGS: Lower chest: Multiple basilar pulmonary nodules are stable from 04/08/2016 but new from 02/17/2016, and likely metastatic. Small pericardial effusion. Small right pleural effusion. Mild atelectasis in the posterior basal segment right lower lobe. Hepatobiliary: Numerous scattered hepatic hypodense lesions with thick enhancing margins are present throughout all segments. Index lesion in the lateral segment left hepatic lobe measures 2.3 by 2.2 cm on image 27/10. A lesion in the dome of the left hepatic lobe has the hypodense central portion measuring 2.0 by 2.1 cm on image 25/4, formerly 1.6 by 1.7 cm by my measurements. Subjectively the lesions appear larger than on 04/08/2016. Pancreas: Unremarkable Spleen: Unremarkable Adrenals/Urinary Tract: 1.5  by 2.5 cm right adrenal mass, precontrast density 34 Hounsfield units, portal venous phase density 84 Hounsfield units, delayed density 69 Hounsfield units, nonspecific but concerning for metastatic disease given the apparent increase from 02/17/2016. Kidneys unremarkable. Stomach/Bowel: High-density oval-shaped structures in the stomach are compatible with calcium pills or similar radiodense pills. Prominent stool throughout the colon favors constipation. Vascular/Lymphatic: No pathologic adenopathy currently identified. No portal vein or hepatic vein thrombosis identified. Other: No supplemental non-categorized findings. Musculoskeletal: Intervertebral spurring and facet spurring cause mild right foraminal stenosis at L5-S1. IMPRESSION: 1. Numerous scattered hepatic hypodense lesions with thick enhancing margins are present in the liver. No cluster sign or double target sign. These have enlarged eating compared to the exam from 4 days ago. Possibilities include bacterial or fungal abscesses, or rapidly progressive hepatic metastatic disease. 2. A small right adrenal mass is new compared to January and probably metastatic. 3. Pulmonary nodules in the lung bases are new compared to January 2018 and likely metastatic. 4. Small pericardial effusion and small right pleural effusion. 5.  Prominent stool throughout  the colon favors constipation. 6. Mild right foraminal stenosis at L5-S1 due to spurring. Electronically Signed   By: Van Clines M.D.   On: 04/12/2016 14:01    Discharge Exam: Vitals:   04/18/16 0550 04/18/16 1447  BP:  113/82  Pulse: 93   Resp: 20 20  Temp: 98.1 F (36.7 C) 100.3 F (37.9 C)   Vitals:   04/18/16 0155 04/18/16 0506 04/18/16 0550 04/18/16 1447  BP:  103/66  113/82  Pulse:  94 93   Resp:  20 20 20   Temp: 98.6 F (37 C) 98.1 F (36.7 C) 98.1 F (36.7 C) 100.3 F (37.9 C)  TempSrc: Oral Oral Oral Oral  SpO2:  98%  100%  Weight:      Height:        General: Pt  is alert, awake, not in acute distress, Cachectic  Cardiovascular: RRR, S1/S2 +, no rubs, no gallops Respiratory: CTA bilaterally, no wheezing, no rhonchi Abdominal: Soft, NT, ND, bowel sounds + Extremities: no edema, no cyanosis   The results of significant diagnostics from this hospitalization (including imaging, microbiology, ancillary and laboratory) are listed below for reference.     Microbiology: Recent Results (from the past 240 hour(s))  Urine culture     Status: None   Collection Time: 04/08/16 10:58 PM  Result Value Ref Range Status   Specimen Description URINE, CLEAN CATCH  Final   Special Requests NONE  Final   Culture   Final    NO GROWTH Performed at [facility], [address], [place], [place] [address]    Report Status 04/10/2016 FINAL  Final  Blood culture (routine x 2)     Status: None   Collection Time: 04/09/16  1:23 AM  Result Value Ref Range Status   Specimen Description BLOOD LEFT FOREARM  Final   Special Requests IN PEDIATRIC BOTTLE 2ML  Final   Culture   Final    NO GROWTH 5 DAYS Performed at [facility], [place] [address] [place], [place] [address]    Report Status 04/14/2016 FINAL  Final  Blood culture (routine x 2)     Status: None   Collection Time: 04/09/16  1:23 AM  Result Value Ref Range Status   Specimen Description BLOOD LEFT HAND  Final   Special Requests IN PEDIATRIC BOTTLE 2ML  Final   Culture   Final    NO GROWTH 5 DAYS Performed at [facility], [place] [address] [place], [place] [address]    Report Status 04/14/2016 FINAL  Final  Fungus Culture With Stain     Status: None (Preliminary result)   Collection Time: 04/13/16  5:20 PM  Result Value Ref Range Status   Fungus Stain Final report  Final    Comment: (NOTE) Performed At: [facility] [place], [place] [id number] Lindon Romp MD [id number]    Fungus (Mycology) Culture PENDING  Incomplete   Fungal Source LIVER  Final  Fungus  Culture Result     Status: None   Collection Time: 04/13/16  5:20 PM  Result Value Ref Range Status   Result 1 Comment  Final    Comment: (NOTE) KOH/Calcofluor preparation:  no fungus observed. Performed At: [facility] [place], [place] [id number] Lindon Romp MD ZD:[id number]   Culture, blood (Routine X 2) w Reflex to ID Panel     Status: None (Preliminary result)   Collection Time: 04/14/16  6:56 AM  Result Value Ref Range Status   Specimen Description BLOOD LEFT HAND  Final   Special Requests IN PEDIATRIC BOTTLE 3CC  Final   Culture   Final    NO GROWTH 4 DAYS Performed at [facility], [address], [place], [place] [address]    Report Status PENDING  Incomplete  Culture, blood (Routine X 2) w Reflex to ID Panel     Status: None (Preliminary result)   Collection Time: 04/14/16  8:04 AM  Result Value Ref Range Status   Specimen Description BLOOD RIGHT ARM  Final   Special Requests IN PEDIATRIC BOTTLE 4CC  Final   Culture   Final    NO GROWTH 4 DAYS Performed at [facility], [place] [address], [place], [place] [address]    Report Status PENDING  Incomplete     Labs: BNP (last 3 results)  Recent Labs  04/08/16 1856  BNP 16.0   Basic Metabolic Panel:  Recent Labs Lab 04/14/16 0523 04/15/16 0510 04/16/16 0810 04/17/16 0529 04/18/16 0520  NA 134* 132* 132* 135 134*  K 4.3 4.3 4.2 4.4 4.6  CL 96* 95* 97* 100* 103  CO2 29 30 28 27 23   GLUCOSE 112* 102* 105* 116* 133*  BUN 7 6 6 7 8   CREATININE 0.58* 0.52* 0.51* 0.52* 0.50*  CALCIUM 10.3 10.3 10.8* 10.2 9.8  MG  --   --  1.8  --   --   PHOS  --   --  2.3*  --   --    Liver Function Tests:  Recent Labs Lab 04/13/16 0537 04/14/16 0523 04/16/16 0810 04/17/16 0529 04/18/16 0520  AST 20 26 35  --  39  ALT 25 24 28   --  33  ALKPHOS 218* 275* 351*  --  431*  BILITOT 0.3 0.6 0.7  --  0.4  PROT 6.3* 7.3 7.1  --  6.9  ALBUMIN 1.7* 1.8* 1.7* 1.8* 1.8*   No results for  input(s): LIPASE, AMYLASE in the last 168 hours. No results for input(s): AMMONIA in the last 168 hours. CBC:  Recent Labs Lab 04/14/16 0523 04/15/16 0510 04/16/16 0810 04/17/16 0529 04/18/16 0520  WBC 22.0* 20.3* 22.1* 28.8* 31.1*  NEUTROABS 18.7* 17.7* 19.7* 26.6* 29.9*  HGB 7.9* 7.1* 9.7* 9.4* 9.3*  HCT 24.4* 22.3* 29.5* 29.1* 29.4*  MCV 79.7 80.5 81.7 81.7 82.4  PLT 674* 630* 620* 664* 698*   Cardiac Enzymes: No results for input(s): CKTOTAL, CKMB, CKMBINDEX, TROPONINI in the last 168 hours. BNP: Invalid input(s): POCBNP CBG: No results for input(s): GLUCAP in the last 168 hours. D-Dimer No results for input(s): DDIMER in the last 72 hours. Hgb A1c No results for input(s): HGBA1C in the last 72 hours. Lipid Profile No results for input(s): CHOL, HDL, LDLCALC, TRIG, CHOLHDL, LDLDIRECT in the last 72 hours. Thyroid function studies No results for input(s): TSH, T4TOTAL, T3FREE, THYROIDAB in the last 72 hours.  Invalid input(s): FREET3 Anemia work up No results for input(s): VITAMINB12, FOLATE, FERRITIN, TIBC, IRON, RETICCTPCT in the last 72 hours. Urinalysis    Component Value Date/Time   COLORURINE YELLOW 04/14/2016 1700   APPEARANCEUR CLEAR 04/14/2016 1700   LABSPEC 1.011 04/14/2016 1700   PHURINE 7.0 04/14/2016 1700   GLUCOSEU NEGATIVE 04/14/2016 1700   HGBUR NEGATIVE 04/14/2016 1700   BILIRUBINUR NEGATIVE 04/14/2016 1700   KETONESUR NEGATIVE 04/14/2016 1700   PROTEINUR NEGATIVE 04/14/2016 1700   UROBILINOGEN 1.0 02/20/2012 1200   NITRITE NEGATIVE  04/14/2016 1700   LEUKOCYTESUR NEGATIVE 04/14/2016 1700   Sepsis Labs Invalid input(s): PROCALCITONIN,  WBC,  LACTICIDVEN Microbiology Recent Results (from the past 240 hour(s))  Urine culture     Status: None   Collection Time: 04/08/16 10:58 PM  Result Value Ref Range Status   Specimen Description URINE, CLEAN CATCH  Final   Special Requests NONE  Final   Culture   Final    NO GROWTH Performed at [facility], [address], [place], [place] [address]    Report Status 04/10/2016 FINAL  Final  Blood culture (routine x 2)     Status: None   Collection Time: 04/09/16  1:23 AM  Result Value Ref Range Status   Specimen Description BLOOD LEFT FOREARM  Final   Special Requests IN PEDIATRIC BOTTLE 2ML  Final   Culture   Final    NO GROWTH 5 DAYS Performed at [facility], [place] [address] [place], [place] [address]    Report Status 04/14/2016 FINAL  Final  Blood culture (routine x 2)     Status: None   Collection Time: 04/09/16  1:23 AM  Result Value Ref Range Status   Specimen Description BLOOD LEFT HAND  Final   Special Requests IN PEDIATRIC BOTTLE 2ML  Final   Culture   Final    NO GROWTH 5 DAYS Performed at [facility], [place] [address], [place], [place] [address]    Report Status 04/14/2016 FINAL  Final  Fungus Culture With Stain     Status: None (Preliminary result)   Collection Time: 04/13/16  5:20 PM  Result Value Ref Range Status   Fungus Stain Final report  Final    Comment: (NOTE) Performed At: [facility] [place], [place] [id number] Lindon Romp MD [id number]    Fungus (Mycology) Culture PENDING  Incomplete   Fungal Source LIVER  Final  Fungus Culture Result     Status: None   Collection Time: 04/13/16  5:20 PM  Result Value Ref Range Status   Result 1 Comment  Final    Comment: (NOTE) KOH/Calcofluor preparation:  no fungus observed. Performed At: [facility] [place], [place] [id number] Lindon Romp MD [id number]   Culture, blood (Routine X 2) w Reflex to ID Panel     Status: None (Preliminary result)   Collection Time: 04/14/16  6:56 AM  Result Value Ref Range Status   Specimen Description BLOOD LEFT HAND  Final   Special Requests IN PEDIATRIC BOTTLE 3CC  Final   Culture   Final    NO GROWTH 4 DAYS Performed at [facility], [place] [address] [place], [place] [address]     Report Status PENDING  Incomplete  Culture, blood (Routine X 2) w Reflex to ID Panel     Status: None (Preliminary result)   Collection Time: 04/14/16  8:04 AM  Result Value Ref Range Status   Specimen Description BLOOD RIGHT ARM  Final   Special Requests IN PEDIATRIC BOTTLE 4CC  Final   Culture   Final    NO GROWTH 4 DAYS Performed at [facility], [place] [address], [place], [place] [address]    Report Status PENDING  Incomplete    Time coordinating discharge: 45 minutes  SIGNED:  Chipper Oman, MD  [facility] 04/18/2016, 3:50 PM  Pager please text page via  [email or web address] Password [email or web address]

## 2016-04-18 NOTE — Progress Notes (Signed)
Johnny Mooney   DOB:May 01, 1970   GL#:[id number]    Subjective: The patient was sleepy and sedated when I walked in.  Subsequently, his cousin woke him up and the patient started to engage in conversation.  He denies significant pain at present time.  When he asked for pain medicine, the pain is usually at the back of his neck.  He is noted to have persistent high fever  Objective:  Vitals:   04/18/16 0506 04/18/16 0550  BP: 103/66   Pulse: 94 93  Resp: 20 20  Temp: 98.1 F (36.7 C) 98.1 F (36.7 C)     Intake/Output Summary (Last 24 hours) at 04/18/16 0819 Last data filed at 04/18/16 0600  Gross per 24 hour  Intake             2250 ml  Output             1050 ml  Net             1200 ml    GENERAL:alert, no distress and comfortable SKIN: skin color, texture, turgor are normal, no rashes or significant lesions EYES: normal, Conjunctiva are pink and non-injected, sclera clear OROPHARYNX:no exudate, no erythema and lips, buccal mucosa, and tongue normal.  Poor dentition.  Thrush has resolved NECK: supple, thyroid normal size, non-tender, without nodularity LYMPH:  no palpable lymphadenopathy in the cervical, axillary or inguinal LUNGS: clear to auscultation and percussion with normal breathing effort HEART: regular rate & rhythm and no murmurs and no lower extremity edema ABDOMEN:abdomen soft, non-tender and normal bowel sounds Musculoskeletal:no cyanosis of digits and no clubbing  NEURO: alert & oriented x 3 with fluent speech, no focal motor/sensory deficits   Labs:  Lab Results  Component Value Date   WBC 31.1 (H) 04/18/2016   HGB 9.3 (L) 04/18/2016   HCT 29.4 (L) 04/18/2016   MCV 82.4 04/18/2016   PLT 698 (H) 04/18/2016   NEUTROABS 29.9 (H) 04/18/2016    Lab Results  Component Value Date   NA 134 (L) 04/18/2016   K 4.6 04/18/2016   CL 103 04/18/2016   CO2 23 04/18/2016    Studies:  Dg Chest Port 1 View  Result Date: 04/17/2016 CLINICAL DATA:  Cough  today EXAM: PORTABLE CHEST 1 VIEW COMPARISON:  04/14/2016 FINDINGS: Heart size is normal. Lungs are free of focal consolidations and pleural effusions. No pulmonary edema. IMPRESSION: No active disease. Electronically Signed   By: Nolon Nations M.D.   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 Certainly, the fever could be related to tumor fever In any case, we discussed goals of care. The patient had verbalized his desire to pursue aggressive chemotherapy and is not giving up I recommend transfer to tertiary center for second opinion and initiation of chemotherapy  Severe anemia Could be related to bone marrow disease He has no signs of bleeding He has received 2 units of blood  transfusion on 04/15/2016  Leukocytosis with fever of unknown origin  Oral thrush, resolved Cultures are unremarkable and negative, tumor fever cannot be excluded He is started on IV antibiotics Oral thrush has resolved with fluconazole  Severe protein calorie malnutrition Malignant cachexia Continue nutrition support I am concerned about starting him on Megace due to risk of thrombosis He was started on mirtazapine and steroids over the weekend with improvement of his appetite  Malignant hypercalcemia His corrected calcium is very  high It is improving with hydration, calcitonin and steroids  Discharge planning His prognosis is very poor Even though he is young without major comorbidities, his cancer is highly aggressive He has completed radiation treatment He is not dischargeable due to high fever and malignant hypercalcemia After extensive discussion with the patient, his mother and his cousin, they are in agreement to obtain second opinion, in the hospital to hospital transfer if possible. I have discussed with primary hospitalist to arrange for transfer   Heath Lark, MD 04/18/2016  8:19 AM

## 2016-04-18 NOTE — Progress Notes (Signed)
Oncology Nurse Navigator Documentation  Visited Mr. Johnny Mooney 0721 to check on his well-being, provide support.  His mother and cousin were at the bedside. He was alert, oriented and engaged in conversation. He indicated was comfortable, no significant pain. He was uncertain re discharge. His mother stated they are considering referral to [facility] for additional opinion re treatment options. He understands I will continue to check on him during this admission.  Gayleen Orem, RN, BSN, [place] [facility] On[facility] Nurse [place] [facility] [place] [phone]

## 2016-04-19 ENCOUNTER — Ambulatory Visit: Payer: Medicaid Other | Admitting: Hematology and Oncology

## 2016-04-19 LAB — CULTURE, BLOOD (ROUTINE X 2)
CULTURE: NO GROWTH
Culture: NO GROWTH

## 2016-04-20 ENCOUNTER — Encounter: Payer: Self-pay | Admitting: Radiation Oncology

## 2016-04-20 NOTE — Progress Notes (Signed)
  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  During a hospitalization, liver masses were seen on imaging. Liver biopsy revealed metastatic squamous cell carcinoma.    Plan: The patient has completed radiation treatment. The patient was instructed to apply Biafine over dry skin in the treatment field as needed. Bandaging of neck wounds to continue.  The patient will return to radiation oncology clinic for routine followup in one half month. Medical oncology is interfacing with him and his family about his metastatic disease, to discuss management.  I advised the patient to call or return sooner if any questions or concerns arise that are related to recovery or treatment.  -----------------------------------  Eppie Gibson, MD  This document serves as a record of services personally performed by Eppie Gibson, MD. It was created on her behalf by Maryla Morrow, a trained medical scribe. The creation of this record is based on the scribe's personal observations and the provider's statements to them. This document has been checked and approved by the attending provider.

## 2016-05-05 ENCOUNTER — Ambulatory Visit: Payer: Medicaid Other | Admitting: Family Medicine

## 2016-05-05 LAB — FUNGUS CULTURE WITH STAIN

## 2016-05-05 LAB — FUNGUS CULTURE RESULT

## 2016-05-05 LAB — FUNGAL ORGANISM REFLEX

## 2016-05-06 ENCOUNTER — Ambulatory Visit: Admission: RE | Admit: 2016-05-06 | Payer: Self-pay | Source: Ambulatory Visit | Admitting: Radiation Oncology

## 2016-05-23 NOTE — Progress Notes (Unsigned)
This encounter was created in error - please disregard.

## 2016-05-24 DEATH — deceased

## 2018-09-04 IMAGING — CT CT HEAD W/ CM
4 of 6 series · 14 of 35 positions shown, 16 images · IV contrast (isovue)
Comparison: 12/10/2015

CLINICAL DATA: Metastatic squamous cell carcinoma.

EXAM:
CT HEAD WITH CONTRAST
CT NECK WITH CONTRAST
TECHNIQUE: Contiguous axial images were obtained from the base of the skull
through the vertex with intravenous contrast. Multidetector CT
imaging of the and neck was performed using the standard protocol
following the bolus administration of intravenous contrast.
CONTRAST:  75 cc Isovue 300 intravenous

[Series 3: axial neck · axial · 0.53mm/px · z∈[-277,-215]mm · 2 of 124 slices shown]
[im 31/124  bone]
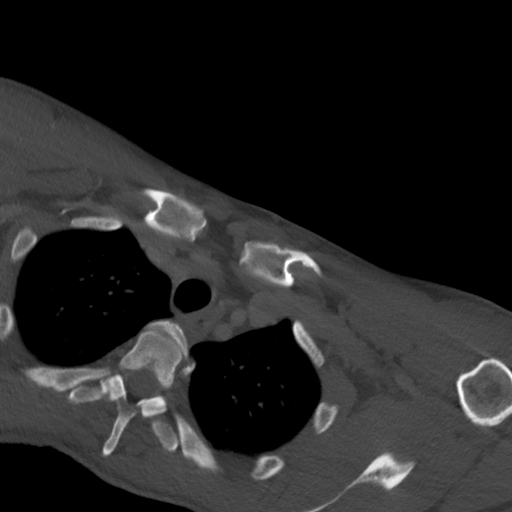
[im 62/124  bone]
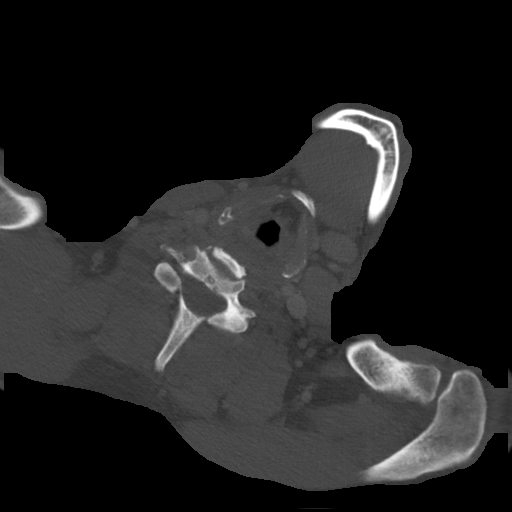

[Series 7: orthogonal ax · axial · 0.46mm/px · z∈[-319,-147]mm · 4 of 157 slices shown, 5 images]
[im 32/157  soft-tissue]
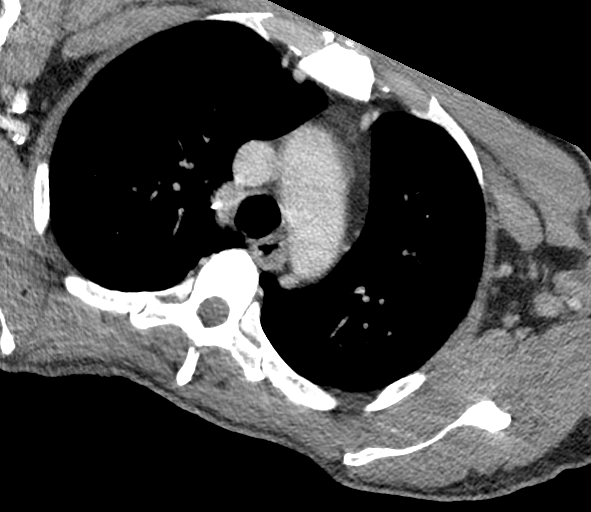
[im 32/157  bone]
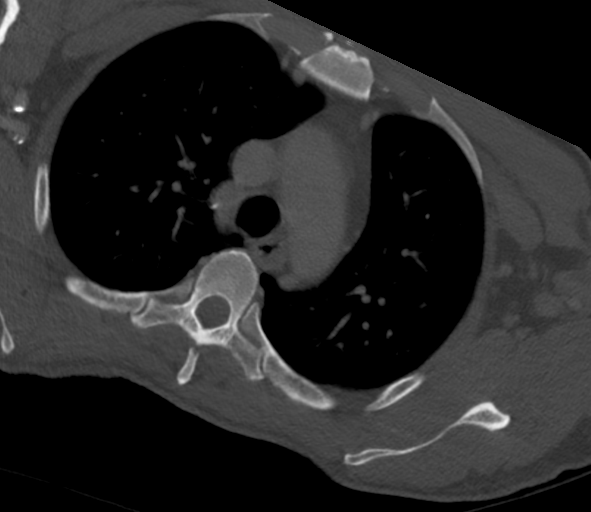
[im 63/157  bone]
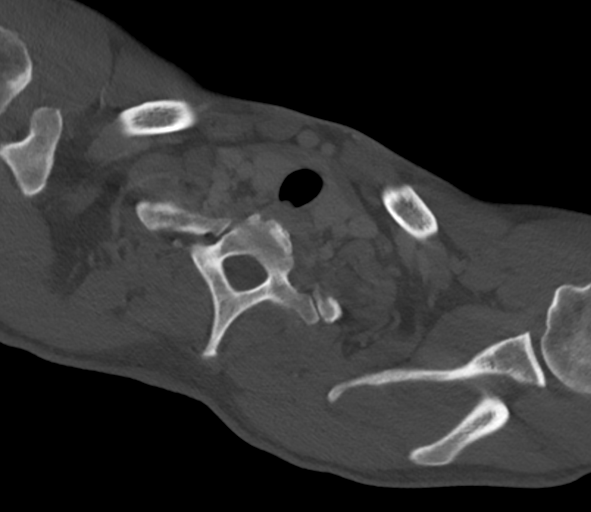
[im 94/157  bone]
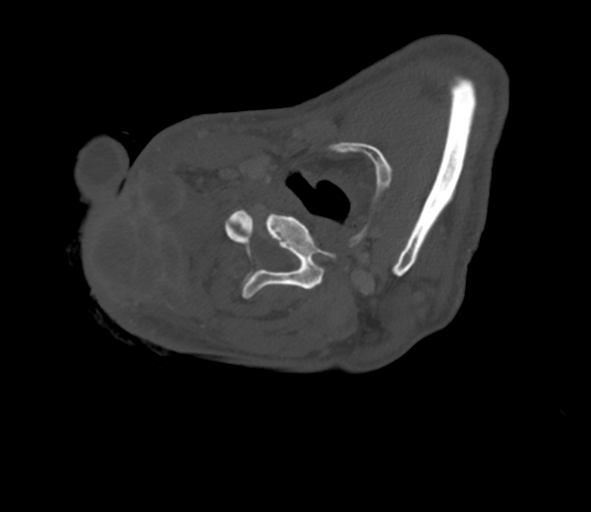
[im 125/157  bone]
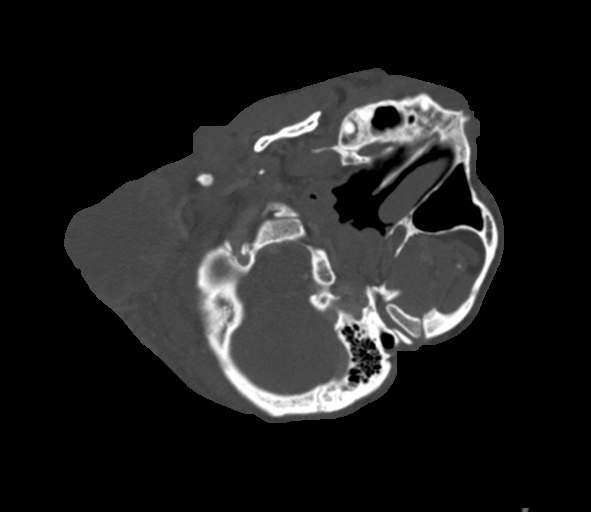

[Series 8: cor neck · coronal · 0.54mm/px · 3 of 104 slices shown]
[im 28/104  bone]
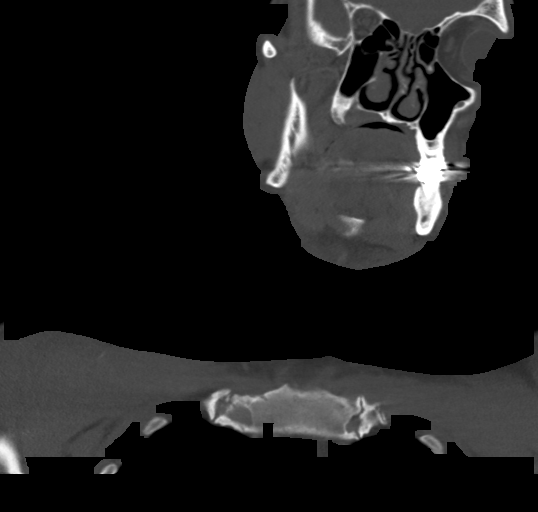
[im 44/104  bone]
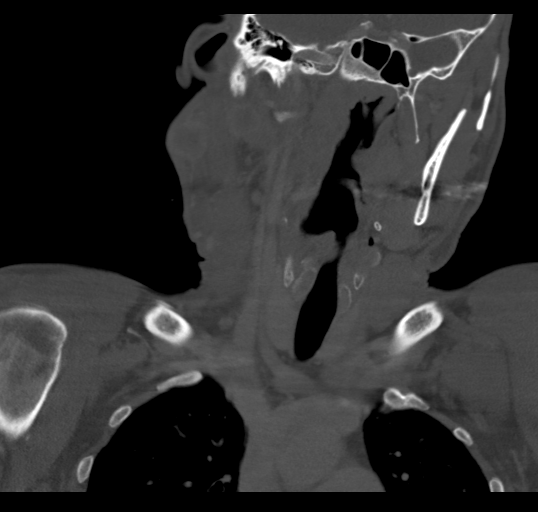
[im 60/104  bone]
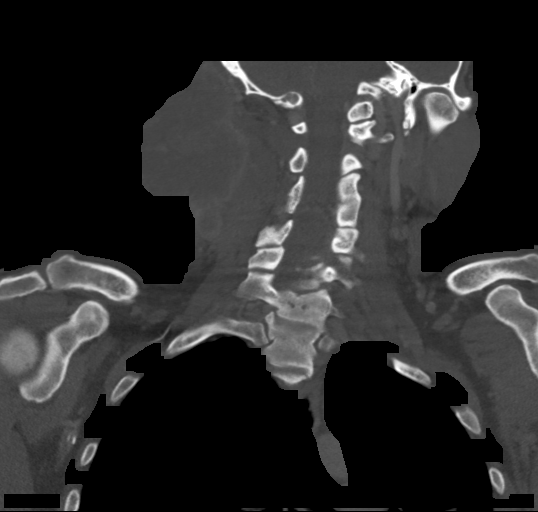

[Series 9: sag neck · sagittal · 0.52mm/px · 5 of 129 slices shown, 6 images]
[im 43/129  bone]
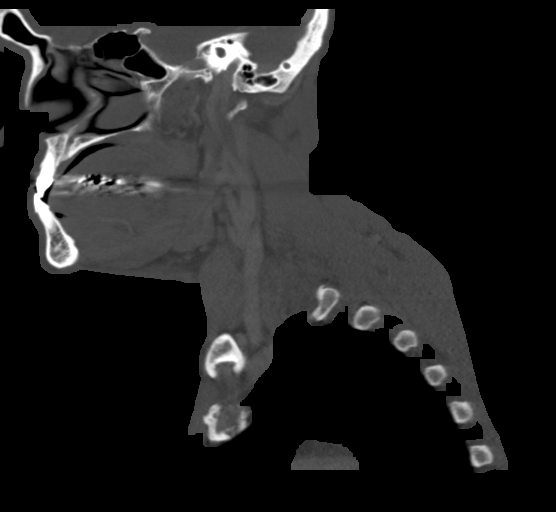
[im 54/129  bone]
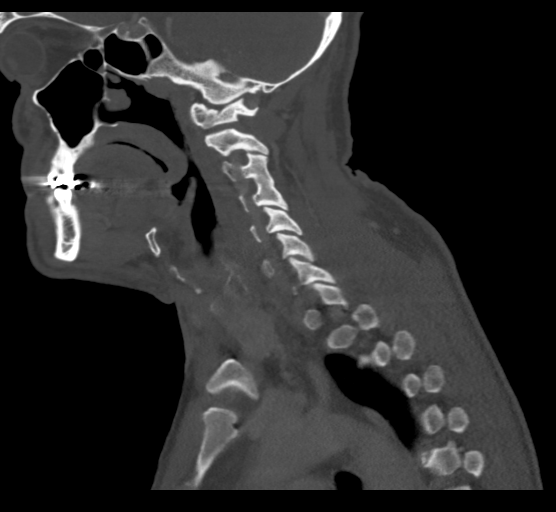
[im 65/129  soft-tissue]
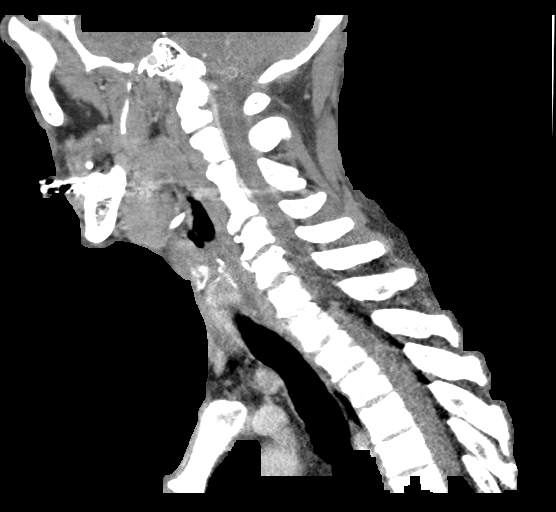
[im 65/129  bone]
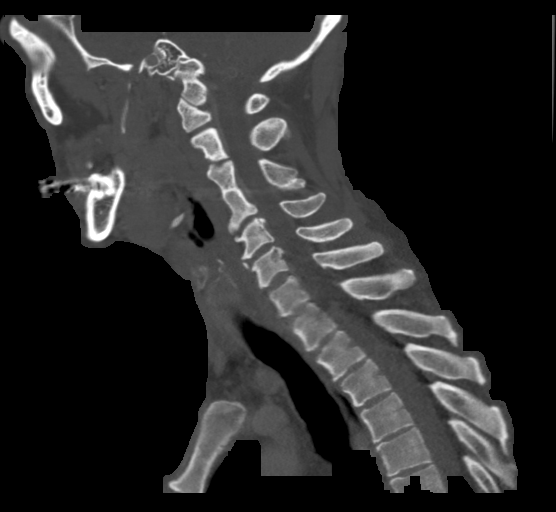
[im 75/129  bone]
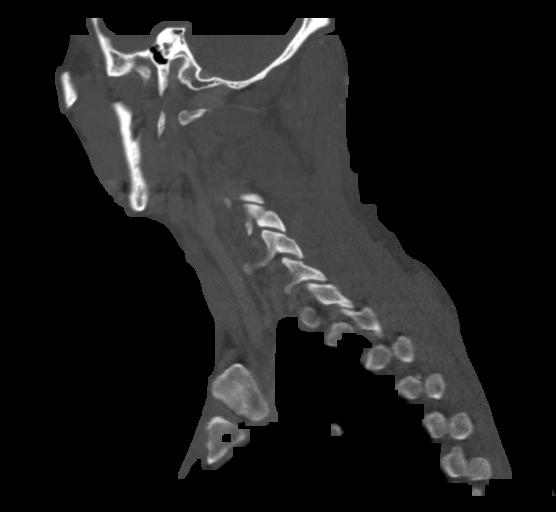
[im 86/129  bone]
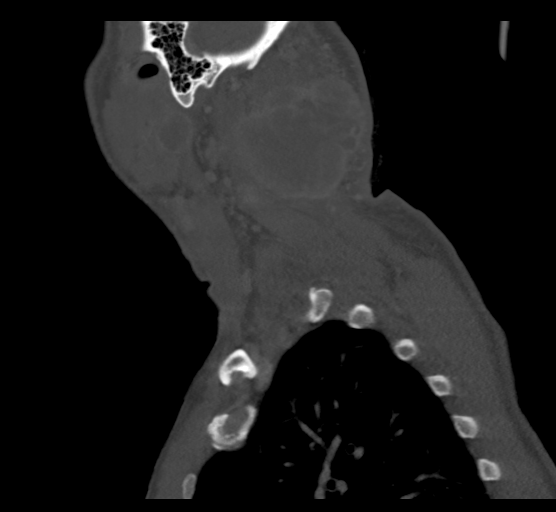

[14 of 35 positions shown; findings below may reference images not displayed]

FINDINGS: CT HEAD FINDINGS

Brain: No abnormal intracranial enhancement or swelling to suggest
cerebral metastatic disease.

Vascular: Major vessels are patent.

Skull: Persistent broad area of enhancement in the posterior right
scalp at site of squamous cell carcinoma resection. No bone erosion
noted. There is a subgaleal avidly enhancing nodule along the left
occipital bone measuring 11 mm, similar to prior and likely non
hypermetabolic.

Sinuses/Orbits: Negative

CT NECK FINDINGS

Pharynx and larynx: Normal. No mass or swelling.

Salivary glands: There is a centrally necrotic mass in the right
parotid tail, given constellation of findings consistent with
adenopathy. The lesion measures 21 mm. No primary mass suspected.

Thyroid: Negative

Lymph nodes: Previously identified lymphadenopathy in the right
suboccipital region and posterior triangle has enlarged markedly,
with nodal conglomerate measuring up to 10 cm today. There is gross
extracapsular spread with invasion of superficial and deep neck
musculature. As above, there is new right parotid bed necrotic
lymphadenopathy with extracapsular disease measuring 21 mm. Avidly
enhancing lymph nodes extend throughout the right neck, but not
reach the supraclavicular fossa. There are 2 avidly enhancing,
lobulated lymph nodes in the left cervical chain along the posterior
margin of the sternocleidomastoid, consistent with metastatic
involvement. These are newly patent and measure up to 1 cm in long
axis. Submental lymph nodes highlighted previously do not appear
involved today.

Vascular: Effaced appearance of the right internal jugular vein
superiorly, without thrombosis. Major vessels are patent

Mastoids and visualized paranasal sinuses: Clear.

Skeleton: No erosion at the right stylomastoid foramen. No osseous
metastasis noted. Periapical erosion around the low left terminal
molar, which has undergone endodontal repair. C3-4 non segmentation.

Upper chest: No apical lung nodules.  Contemporaneous PET-CT.
IMPRESSION: 1. Scalp squamous cell carcinoma with metastatic lymph nodes. There
has been remarkable progression of the suboccipital and cervical
adenopathy on the right with bulky nodal mass measuring up to 10 cm
and showing deep ulceration. New malignant adenopathy in the right
parotid bed and left neck.
2. Stable appearance of scalp resection site. CT cannot
differentiate between residual tumor and granulation tissue,
reference contemporaneous PET-CT. No bone erosion or intracranial
metastasis.
